# Patient Record
Sex: Male | Born: 1970
Health system: Southern US, Community
[De-identification: ages and names within clinical notes are randomized; demographics above are authoritative.]

## PROBLEM LIST (undated history)

## (undated) DIAGNOSIS — R51 Headache: Secondary | ICD-10-CM

## (undated) DIAGNOSIS — H9191 Unspecified hearing loss, right ear: Secondary | ICD-10-CM

## (undated) DIAGNOSIS — Z0000 Encounter for general adult medical examination without abnormal findings: Principal | ICD-10-CM

## (undated) DIAGNOSIS — E7800 Pure hypercholesterolemia, unspecified: Secondary | ICD-10-CM

## (undated) DIAGNOSIS — I10 Essential (primary) hypertension: Secondary | ICD-10-CM

## (undated) DIAGNOSIS — M109 Gout, unspecified: Secondary | ICD-10-CM

## (undated) DIAGNOSIS — E669 Obesity, unspecified: Principal | ICD-10-CM

## (undated) HISTORY — DX: Unspecified hearing loss, right ear: H91.91

## (undated) HISTORY — DX: Headache: R51

## (undated) HISTORY — DX: Encounter for general adult medical examination without abnormal findings: Z00.00

## (undated) HISTORY — DX: Obesity, unspecified: E66.9

## (undated) HISTORY — DX: Gout, unspecified: M10.9

---

## 2002-01-31 ENCOUNTER — Encounter: Payer: Self-pay | Admitting: Emergency Medicine

## 2002-01-31 ENCOUNTER — Emergency Department (HOSPITAL_COMMUNITY): Admission: EM | Admit: 2002-01-31 | Discharge: 2002-01-31 | Payer: Self-pay | Admitting: Emergency Medicine

## 2007-08-28 ENCOUNTER — Ambulatory Visit: Payer: Self-pay | Admitting: Cardiology

## 2007-08-28 LAB — CONVERTED CEMR LAB: Pro B Natriuretic peptide (BNP): 3 pg/mL (ref 0.0–100.0)

## 2007-09-19 ENCOUNTER — Ambulatory Visit: Payer: Self-pay

## 2007-09-19 ENCOUNTER — Encounter: Payer: Self-pay | Admitting: Cardiology

## 2009-08-19 ENCOUNTER — Encounter: Admission: RE | Admit: 2009-08-19 | Discharge: 2009-08-19 | Payer: Self-pay | Admitting: Otolaryngology

## 2010-11-28 NOTE — Assessment & Plan Note (Signed)
[facility]                            CARDIOLOGY OFFICE NOTE   TAVIOUS, GRIESINGER                        MRN:          [id number]  DATE:08/28/2007                            DOB:          1970-10-01    Mr. Bogard is a pleasant 40 year old gentleman whom I am asked to  evaluate for dyspnea and borderline exercise treadmill.  He has no prior  cardiac history.  For the past 6 months he has noticed some dyspnea on  exertion.  This typically occurs with longer walks, and more extreme  activities.  He does not occur at rest.  There is no orthopnea or PND.  There is no pedal edema.  He has not had chest pain by his report.  There are no palpitations or syncope.  He has multiple cardiac risk  factors including a strong family history.  He therefore underwent an  exercise treadmill at [facility].  The patient  was noted to have had chest pain/pressure, dizziness, and dyspnea.  There was a question of borderline ST depression that was felt to be  equivocal.  We were, therefore, asked to further evaluate.  His  medications include lisinopril HCT 25, 12.5 daily, Vytorin 10/40 daily,  multivitamin daily.  He has no known drug allergies.   SOCIAL HISTORY:  Does not smoke nor does he consume alcohol.   FAMILY HISTORY:  Strongly positive coronary disease.  His brother had  coronary bypass graft in his 56s.  His father died of myocardial  infarction in his 42s.  His mother died of an aneurysm.   PAST MEDICAL HISTORY:  Significant for hypertension, hyperlipidemia.  There is no diabetes mellitus.  He has had no previous surgeries.  He  does have osteoarthritis his knees from his job.   REVIEW OF SYSTEMS:  He denies any headaches or fevers, chills.  There is  no productive cough or hemoptysis.  There is no dysphagia, odynophagia,  melena or hematochezia.  There is no dysuria inch of his or seizure  active.  There is no orthopnea, PND or pedal  edema.  Remaining systems  are negative.   PHYSICAL EXAM:  Today shows a blood pressure 121/76, pulse is 65.  Weight 227 pounds.  Well-developed, somewhat obese.  He is no acute distress at present.  Skin is warm and dry.  Not depressed.  There is no peripheral clubbing.  His back is normal.  HEENT:  Normal with normal eyelids.  Neck is supple with normal upstroke bilaterally.  No bruits noted.  There is no jugular distention and no thyromegaly is noted.  His chest is clear to auscultation with no expansion.  CARDIOVASCULAR:  Regular rhythm.  Normal S1-S2.  There are no murmurs,  rubs or gallops noted.  ABDOMEN:  Exam nontender.  Positive bowel sounds hepatosplenomegaly no  mass appreciated.  There is no abdominal bruit.  He has 2+ femoral  pulses bilaterally.  No bruits.  EXTREMITIES:  Show no edema palpate no cords.  He is 2+ posterior tibial  pulses bilaterally.  NEUROLOGIC:  Grossly intact.  His electrocardiogram shows a sinus rhythm at a rate of 77.  The axis  was normal.  There is significant ST changes.  This is from a treadmill  motion and rocking and family practice on July 30, 2007.   DIAGNOSES:  1. Dyspnea on exertion - Mr. Hutsell is having dyspnea.  This may be      related to cardiac status but could also be related to      deconditioning as he does not exercise and somewhat overweight.  I      will check a BNP.  We will also schedule him at stress      echocardiogram both to quantify his LV function and to more fully      assess for ischemia.  If of there are no significant abnormalities      and we will plan risk factor modification and I have encouraged      strict diet and exercise particularly in light of the strong family      history.  2. Hypertension - his blood pressure is controlled on present      medications.  He will call continue the lisinopril      hydrochlorothiazide.  This is being followed by [facility]  3.  Hyperlipidemia - he will continue on statin.  This is also being      monitored by [facility].  4. Risk factor modification - we again discussed importance of diet      and exercise.  Note he does not smoke.  We will see him back on as-      needed basis pending the results of the stress echocardiogram.     Madolyn Frieze. Jens Som, MD, [facility]  Electronically Signed    BSC/MedQ  DD: 08/28/2007  DT: 08/29/2007  Job #: [id number]   cc:   Ernestina Penna, M.D.

## 2012-12-16 ENCOUNTER — Ambulatory Visit (INDEPENDENT_AMBULATORY_CARE_PROVIDER_SITE_OTHER): Payer: BC Managed Care – PPO | Admitting: Family Medicine

## 2012-12-16 ENCOUNTER — Encounter: Payer: Self-pay | Admitting: Family Medicine

## 2012-12-16 VITALS — BP 116/73 | HR 66 | Temp 97.0°F | Wt 218.8 lb

## 2012-12-16 DIAGNOSIS — I10 Essential (primary) hypertension: Secondary | ICD-10-CM

## 2012-12-16 DIAGNOSIS — R202 Paresthesia of skin: Secondary | ICD-10-CM

## 2012-12-16 DIAGNOSIS — E785 Hyperlipidemia, unspecified: Secondary | ICD-10-CM

## 2012-12-16 DIAGNOSIS — R635 Abnormal weight gain: Secondary | ICD-10-CM

## 2012-12-16 DIAGNOSIS — R300 Dysuria: Secondary | ICD-10-CM

## 2012-12-16 DIAGNOSIS — E669 Obesity, unspecified: Secondary | ICD-10-CM

## 2012-12-16 DIAGNOSIS — R209 Unspecified disturbances of skin sensation: Secondary | ICD-10-CM

## 2012-12-16 LAB — POCT UA - MICROSCOPIC ONLY
Bacteria, U Microscopic: NEGATIVE
Casts, Ur, LPF, POC: NEGATIVE
Crystals, Ur, HPF, POC: NEGATIVE
Epithelial cells, urine per micros: NEGATIVE
Mucus, UA: NEGATIVE
RBC, urine, microscopic: NEGATIVE
WBC, Ur, HPF, POC: NEGATIVE
Yeast, UA: NEGATIVE

## 2012-12-16 LAB — VITAMIN B12: Vitamin B-12: 273 pg/mL (ref 211–911)

## 2012-12-16 LAB — COMPLETE METABOLIC PANEL WITH GFR
ALT: 19 U/L (ref 0–53)
AST: 19 U/L (ref 0–37)
Albumin: 4.2 g/dL (ref 3.5–5.2)
Alkaline Phosphatase: 53 U/L (ref 39–117)
BUN: 10 mg/dL (ref 6–23)
CO2: 25 mEq/L (ref 19–32)
Calcium: 9.4 mg/dL (ref 8.4–10.5)
Chloride: 104 mEq/L (ref 96–112)
Creat: 0.95 mg/dL (ref 0.50–1.35)
GFR, Est African American: >89 mL/min
GFR, Est Non African American: >89 mL/min
Glucose, Bld: 82 mg/dL (ref 70–99)
Potassium: 3.9 mEq/L (ref 3.5–5.3)
Sodium: 137 mEq/L (ref 135–145)
Total Bilirubin: 0.6 mg/dL (ref 0.3–1.2)
Total Protein: 7.1 g/dL (ref 6.0–8.3)

## 2012-12-16 LAB — POCT URINALYSIS DIPSTICK
Bilirubin, UA: NEGATIVE
Blood, UA: NEGATIVE
Glucose, UA: NEGATIVE
Ketones, UA: NEGATIVE
Leukocytes, UA: NEGATIVE
Nitrite, UA: NEGATIVE
Protein, UA: NEGATIVE
Spec Grav, UA: 1.01
Urobilinogen, UA: NEGATIVE
pH, UA: 7

## 2012-12-16 LAB — TSH: TSH: 1.286 u[IU]/mL (ref 0.350–4.500)

## 2012-12-16 LAB — POCT GLYCOSYLATED HEMOGLOBIN (HGB A1C): Hemoglobin A1C: 5.4

## 2012-12-16 MED ORDER — AMLODIPINE BESYLATE 10 MG PO TABS: 10.0000 mg | ORAL_TABLET | Freq: Every day | ORAL | Status: DC

## 2012-12-16 MED ORDER — LOSARTAN POTASSIUM-HCTZ 100-25 MG PO TABS: 1.0000 | ORAL_TABLET | Freq: Every day | ORAL | Status: DC

## 2012-12-16 MED ORDER — SIMVASTATIN 20 MG PO TABS: 20.0000 mg | ORAL_TABLET | Freq: Every day | ORAL | Status: DC

## 2012-12-16 NOTE — Patient Instructions (Addendum)
      Dr Woodroe Mode Recommendations  Diet and Exercise discussed with patient.  For nutrition information, I recommend books:  1).Eat to Live by Dr Monico Hoar. 2).Prevent and Reverse Heart Disease by Dr Suzzette Righter. 3) Dr Katherina Right Book: Reversing Diabetes  Exercise recommendations are:  If unable to walk, then the patient can exercise in a chair 3 times a day. By flapping arms like a bird gently and raising legs outwards to the front.  If ambulatory, the patient can go for walks for 30 minutes 3 times a week. Then increase the intensity and duration as tolerated.  Goal is to try to attain exercise frequency to 5 times a week.  If applicable: Best to perform resistance exercises (machines or weights) 2 days a week and cardio type exercises 3 days per week.   Take a baby aspirin 81 mg daily.

## 2012-12-16 NOTE — Progress Notes (Signed)
Patient ID: Jason Harper, male   DOB: Sep 01, 1970, 42 y.o.   MRN: [id number] SUBJECTIVE: HPI: Patient is here for follow up of hyperlipidemia/hypertension:  denies Headache;denies Chest Pain;denies weakness;denies Shortness of Breath and orthopnea;denies Visual changes;denies palpitations;denies cough;denies pedal edema;denies symptoms of TIA or stroke;deniesClaudication symptoms. admits to Compliance with medications; denies Problems with medications.  Gets a tingling in the scrotal area intermittently, especially when he is urinating. No urethral d/c, denies STDs.  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  Allergies: reviewed/updated in Epic  Medications: reviewed/updated in Epic  Immunizations: reviewed/updated in Epic  ROS: As above in the HPI. All other systems are stable or negative.  OBJECTIVE: APPEARANCE:  Patient in no acute distress.The patient appeared well nourished and normally developed. Acyanotic. Waist:43.5 inches VITAL SIGNS:BP 116/73  Pulse 66  Temp(Src) 97 F (36.1 C) (Oral)  Wt 218 lb 12.8 oz (99.247 kg) Obese WM  SKIN: warm and  Dry without overt rashes, tattoos and scars  HEAD and Neck: without JVD, Head and scalp: normal Eyes:No scleral icterus. Fundi normal, eye movements normal. Ears: Auricle normal, canal normal, Tympanic membranes normal, insufflation normal. Nose: normal Throat: normal Neck & thyroid: normal  CHEST & LUNGS: Chest wall: normal Lungs: Clear  CVS: Reveals the PMI to be normally located. Regular rhythm, First and Second Heart sounds are normal,  absence of murmurs, rubs or gallops. Peripheral vasculature: Radial pulses: normal Dorsal pedis pulses: normal Posterior pulses: normal  ABDOMEN:  Appearance:obese Benign,, no organomegaly, no masses, no  Abdominal Aortic enlargement. No Guarding , no rebound. No Bruits. Bowel sounds: normal  RECTAL: N/A [id number]  EXTREMETIES: nonedematous. Both Femoral and Pedal pulses are normal.  MUSCULOSKELETAL:  Spine: normal Joints: intact  NEUROLOGIC: oriented to time,place and person; nonfocal. Strength is normal Sensory is normal Reflexes are normal Cranial Nerves are normal.  ASSESSMENT: HTN (hypertension) - Plan: COMPLETE METABOLIC PANEL WITH GFR, amLODipine (NORVASC) 10 MG tablet, losartan-hydrochlorothiazide (HYZAAR) 100-25 MG per tablet  HLD (hyperlipidemia) - Plan: NMR Lipoprofile with Lipids, simvastatin (ZOCOR) 20 MG tablet  Obesity, unspecified - Plan: POCT glycosylated hemoglobin (Hb A1C)  Dysuria - Plan: POCT UA - Microscopic Only, POCT urinalysis dipstick, Urine culture  Paresthesia - Plan: Vitamin B12, TSH  Metabolic syndrome.  PLAN: Orders Placed This Encounter  Procedures  . Urine culture  . COMPLETE METABOLIC PANEL WITH GFR  . NMR Lipoprofile with Lipids  . Vitamin B12  . TSH  . POCT UA - Microscopic Only  . POCT glycosylated hemoglobin (Hb A1C)  . POCT urinalysis dipstick   Meds ordered this encounter  Medications  . DISCONTD: amLODipine (NORVASC) 10 MG tablet    Sig: 10 mg daily.   Marland Kitchen DISCONTD: losartan-hydrochlorothiazide (HYZAAR) 100-25 MG per tablet    Sig: Take 1 tablet by mouth daily.   Marland Kitchen DISCONTD: simvastatin (ZOCOR) 20 MG tablet    Sig: Take 20 mg by mouth at bedtime.   Marland Kitchen amLODipine (NORVASC) 10 MG tablet    Sig: Take 1 tablet (10 mg total) by mouth daily.    Dispense:  30 tablet    Refill:  5  . losartan-hydrochlorothiazide (HYZAAR) 100-25 MG per tablet    Sig: Take 1 tablet by mouth daily.    Dispense:  30 tablet    Refill:  5  . simvastatin (ZOCOR) 20 MG tablet    Sig:  Take 1 tablet (20 mg total) by mouth at bedtime.    Dispense:  30 tablet    Refill:  5        Dr Woodroe Mode Recommendations  Diet and Exercise discussed with  patient.  For nutrition information, I recommend books:  1).Eat to Live by Dr Monico Hoar. 2).Prevent and Reverse Heart Disease by Dr Suzzette Righter. 3) Dr Katherina Right Book: Reversing Diabetes  Exercise recommendations are:  If unable to walk, then the patient can exercise in a chair 3 times a day. By flapping arms like a bird gently and raising legs outwards to the front.  If ambulatory, the patient can go for walks for 30 minutes 3 times a week. Then increase the intensity and duration as tolerated.  Goal is to try to attain exercise frequency to 5 times a week.  If applicable: Best to perform resistance exercises (machines or weights) 2 days a week and cardio type exercises 3 days per week.  30 minutes discussion on Lifestyle therapeutic changes needed.  Return in about 3 months (around 03/18/2013) for Recheck medical problems.   Vladimir Lenhoff P. [place] Charon, M.D.

## 2012-12-17 LAB — NMR LIPOPROFILE WITH LIPIDS
Cholesterol, Total: 135 mg/dL (ref <200)
HDL Particle Number: 25.9 umol/L — ABNORMAL LOW (ref >=30.5)
HDL Size: 8.6 nm — ABNORMAL LOW (ref >=9.2)
HDL-C: 31 mg/dL — ABNORMAL LOW (ref >=40)
LDL (calc): 84 mg/dL (ref <100)
LDL Particle Number: 1482 nmol/L — ABNORMAL HIGH (ref <1000)
LDL Size: 19.7 nm — ABNORMAL LOW (ref >20.5)
LP-IR Score: 70 — ABNORMAL HIGH (ref <=45)
Large HDL-P: <1.3 umol/L — ABNORMAL LOW (ref >=4.8)
Large VLDL-P: 2.2 nmol/L (ref <=2.7)
Small LDL Particle Number: 1195 nmol/L — ABNORMAL HIGH (ref <=527)
Triglycerides: 102 mg/dL (ref <150)
VLDL Size: 46.8 nm — ABNORMAL HIGH (ref <=46.6)

## 2012-12-17 LAB — URINE CULTURE
Colony Count: NO GROWTH
Organism ID, Bacteria: NO GROWTH

## 2012-12-18 NOTE — Progress Notes (Signed)
Quick Note:  Lab result at goal or close to goal. No change in Medications for now.Recommend aggressive diet , exercise and weight loss otherwise we will have to add more cholesterol medications. No Change in plans and follow up. Cause for the tingling not found. Observe for now. Neetu Carrozza P. [place] Charon, M.D.  ______

## 2012-12-19 ENCOUNTER — Telehealth: Payer: Self-pay | Admitting: Family Medicine

## 2012-12-19 NOTE — Telephone Encounter (Signed)
Left results on pt vm and requested to call if questions

## 2013-01-07 ENCOUNTER — Emergency Department (HOSPITAL_COMMUNITY): Payer: BC Managed Care – PPO

## 2013-01-07 ENCOUNTER — Encounter (HOSPITAL_COMMUNITY): Payer: Self-pay

## 2013-01-07 ENCOUNTER — Emergency Department (HOSPITAL_COMMUNITY)
Admission: EM | Admit: 2013-01-07 | Discharge: 2013-01-07 | Disposition: A | Discharge status: Home / Self Care | Payer: BC Managed Care – PPO | Attending: Emergency Medicine | Admitting: Emergency Medicine

## 2013-01-07 DIAGNOSIS — S0990XA Unspecified injury of head, initial encounter: Secondary | ICD-10-CM | POA: Insufficient documentation

## 2013-01-07 DIAGNOSIS — Z7982 Long term (current) use of aspirin: Secondary | ICD-10-CM | POA: Insufficient documentation

## 2013-01-07 DIAGNOSIS — S8990XA Unspecified injury of unspecified lower leg, initial encounter: Secondary | ICD-10-CM | POA: Insufficient documentation

## 2013-01-07 DIAGNOSIS — I10 Essential (primary) hypertension: Secondary | ICD-10-CM | POA: Insufficient documentation

## 2013-01-07 DIAGNOSIS — M79671 Pain in right foot: Secondary | ICD-10-CM

## 2013-01-07 DIAGNOSIS — S99919A Unspecified injury of unspecified ankle, initial encounter: Secondary | ICD-10-CM | POA: Insufficient documentation

## 2013-01-07 DIAGNOSIS — S4980XA Other specified injuries of shoulder and upper arm, unspecified arm, initial encounter: Secondary | ICD-10-CM | POA: Insufficient documentation

## 2013-01-07 DIAGNOSIS — Y92410 Unspecified street and highway as the place of occurrence of the external cause: Secondary | ICD-10-CM | POA: Insufficient documentation

## 2013-01-07 DIAGNOSIS — Z79899 Other long term (current) drug therapy: Secondary | ICD-10-CM | POA: Insufficient documentation

## 2013-01-07 DIAGNOSIS — S0993XA Unspecified injury of face, initial encounter: Secondary | ICD-10-CM | POA: Insufficient documentation

## 2013-01-07 DIAGNOSIS — S46909A Unspecified injury of unspecified muscle, fascia and tendon at shoulder and upper arm level, unspecified arm, initial encounter: Secondary | ICD-10-CM | POA: Insufficient documentation

## 2013-01-07 DIAGNOSIS — E7800 Pure hypercholesterolemia, unspecified: Secondary | ICD-10-CM | POA: Insufficient documentation

## 2013-01-07 DIAGNOSIS — M25511 Pain in right shoulder: Secondary | ICD-10-CM

## 2013-01-07 DIAGNOSIS — S99929A Unspecified injury of unspecified foot, initial encounter: Secondary | ICD-10-CM | POA: Insufficient documentation

## 2013-01-07 DIAGNOSIS — Y9389 Activity, other specified: Secondary | ICD-10-CM | POA: Insufficient documentation

## 2013-01-07 HISTORY — DX: Essential (primary) hypertension: I10

## 2013-01-07 HISTORY — DX: Pure hypercholesterolemia, unspecified: E78.00

## 2013-01-07 MED ORDER — OXYCODONE-ACETAMINOPHEN 5-325 MG PO TABS
2.0000 | ORAL_TABLET | Freq: Once | ORAL | Status: AC
  Administered 2013-01-07: 2 via ORAL
  Filled 2013-01-07: qty 2

## 2013-01-07 NOTE — ED Notes (Signed)
Family at bedside. 
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
## 2013-01-08 ENCOUNTER — Telehealth: Payer: Self-pay | Admitting: Nurse Practitioner
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
## 2013-01-12 ENCOUNTER — Encounter: Payer: Self-pay | Admitting: Nurse Practitioner

## 2013-01-12 ENCOUNTER — Ambulatory Visit: Payer: BC Managed Care – PPO | Admitting: Nurse Practitioner

## 2013-01-12 VITALS — BP 117/78 | HR 77 | Temp 98.1°F | Ht 65.0 in | Wt 216.0 lb

## 2013-01-12 DIAGNOSIS — M545 Low back pain, unspecified: Secondary | ICD-10-CM

## 2013-01-12 DIAGNOSIS — M25519 Pain in unspecified shoulder: Secondary | ICD-10-CM

## 2013-01-12 DIAGNOSIS — G8911 Acute pain due to trauma: Secondary | ICD-10-CM

## 2013-01-12 MED ORDER — IBUPROFEN 800 MG PO TABS: 800.0000 mg | ORAL_TABLET | Freq: Three times a day (TID) | ORAL | Status: DC | PRN

## 2013-01-12 MED ORDER — CYCLOBENZAPRINE HCL 10 MG PO TABS: 10.0000 mg | ORAL_TABLET | Freq: Three times a day (TID) | ORAL | Status: DC | PRN
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
## 2013-01-12 NOTE — Patient Instructions (Signed)
Back Pain, Adult  Low back pain is very common. About 1 in 5 people have back pain. The cause of low back pain is rarely dangerous. The pain often gets better over time. About half of people with a sudden onset of back pain feel better in just 2 weeks. About 8 in 10 people feel better by 6 weeks.   CAUSES  Some common causes of back pain include:  · Strain of the muscles or ligaments supporting the spine.  · Wear and tear (degeneration) of the spinal discs.  · Arthritis.  · Direct injury to the back.  DIAGNOSIS  Most of the time, the direct cause of low back pain is not known. However, back pain can be treated effectively even when the exact cause of the pain is unknown. Answering your caregiver's questions about your overall health and symptoms is one of the most accurate ways to make sure the cause of your pain is not dangerous. If your caregiver needs more information, he or she may order lab work or imaging tests (X-rays or MRIs). However, even if imaging tests show changes in your back, this usually does not require surgery.  HOME CARE INSTRUCTIONS  For many people, back pain returns. Since low back pain is rarely dangerous, it is often a condition that people can learn to manage on their own.   · Remain active. It is stressful on the back to sit or stand in one place. Do not sit, drive, or stand in one place for more than 30 minutes at a time. Take short walks on level surfaces as soon as pain allows. Try to increase the length of time you walk each day.  · Do not stay in bed. Resting more than 1 or 2 days can delay your recovery.  · Do not avoid exercise or work. Your body is made to move. It is not dangerous to be active, even though your back may hurt. Your back will likely heal faster if you return to being active before your pain is gone.  · Pay attention to your body when you  bend and lift. Many people have less discomfort when lifting if they bend their knees, keep the load close to their bodies, and  avoid twisting. Often, the most comfortable positions are those that put less stress on your recovering back.  · Find a comfortable position to sleep. Use a firm mattress and lie on your side with your knees slightly bent. If you lie on your back, put a pillow under your knees.  · Only take over-the-counter or prescription medicines as directed by your caregiver. Over-the-counter medicines to reduce pain and inflammation are often the most helpful. Your caregiver may prescribe muscle relaxant drugs. These medicines help dull your pain so you can more quickly return to your normal activities and healthy exercise.  · Put ice on the injured area.  · Put ice in a plastic bag.  · Place a towel between your skin and the bag.  · Leave the ice on for 15-20 minutes, 3-4 times a day for the first 2 to 3 days. After that, ice and heat may be alternated to reduce pain and spasms.  · Ask your caregiver about trying back exercises and gentle massage. This may be of some benefit.  · Avoid feeling anxious or stressed. Stress increases muscle tension and can worsen back pain. It is important to recognize when you are anxious or stressed and learn ways to manage it. Exercise is a great option.  SEEK MEDICAL CARE IF:  · You have pain that is not relieved with rest or   medicine.  · You have pain that does not improve in 1 week.  · You have new symptoms.  · You are generally not feeling well.  SEEK IMMEDIATE MEDICAL CARE IF:   · You have pain that radiates from your back into your legs.  · You develop new bowel or bladder control problems.  · You have unusual weakness or numbness in your arms or legs.  · You develop nausea or vomiting.  · You develop abdominal pain.  · You feel faint.  Document Released: 07/02/2005 Document Revised: 01/01/2012 Document Reviewed: 11/20/2010  Exit[employer]® Patient Information ©2014 [employer].

## 2013-03-19 ENCOUNTER — Ambulatory Visit: Payer: BC Managed Care – PPO | Admitting: Family Medicine

## 2013-04-01 ENCOUNTER — Ambulatory Visit (INDEPENDENT_AMBULATORY_CARE_PROVIDER_SITE_OTHER): Payer: BC Managed Care – PPO | Admitting: Nurse Practitioner

## 2013-04-01 ENCOUNTER — Encounter: Payer: Self-pay | Admitting: Nurse Practitioner

## 2013-04-01 VITALS — BP 120/77 | HR 74 | Temp 97.1°F | Ht 65.0 in | Wt 222.0 lb

## 2013-04-01 DIAGNOSIS — E785 Hyperlipidemia, unspecified: Secondary | ICD-10-CM

## 2013-04-01 DIAGNOSIS — Z125 Encounter for screening for malignant neoplasm of prostate: Secondary | ICD-10-CM

## 2013-04-01 DIAGNOSIS — I10 Essential (primary) hypertension: Secondary | ICD-10-CM | POA: Insufficient documentation

## 2013-04-01 MED ORDER — AMLODIPINE BESYLATE 10 MG PO TABS: 10.0000 mg | ORAL_TABLET | Freq: Every day | ORAL | Status: DC

## 2013-04-01 MED ORDER — LOSARTAN POTASSIUM-HCTZ 100-25 MG PO TABS: 1.0000 | ORAL_TABLET | Freq: Every day | ORAL | Status: DC

## 2013-04-01 MED ORDER — SIMVASTATIN 20 MG PO TABS: 20.0000 mg | ORAL_TABLET | Freq: Every day | ORAL | Status: DC

## 2013-04-01 NOTE — Patient Instructions (Signed)
Health Maintenance, Males A healthy lifestyle and preventative care can promote health and wellness.  Maintain regular health, dental, and eye exams.  Eat a healthy diet. Foods like vegetables, fruits, whole grains, low-fat dairy products, and lean protein foods contain the nutrients you need without too many calories. Decrease your intake of foods high in solid fats, added sugars, and salt. Get information about a proper diet from your caregiver, if necessary.  Regular physical exercise is one of the most important things you can do for your health. Most adults should get at least 150 minutes of moderate-intensity exercise (any activity that increases your heart rate and causes you to sweat) each week. In addition, most adults need muscle-strengthening exercises on 2 or more days a week.   Maintain a healthy weight. The body mass index (BMI) is a screening tool to identify possible weight problems. It provides an estimate of body fat based on height and weight. Your caregiver can help determine your BMI, and can help you achieve or maintain a healthy weight. For adults 20 years and older:  A BMI below 18.5 is considered underweight.  A BMI of 18.5 to 24.9 is normal.  A BMI of 25 to 29.9 is considered overweight.  A BMI of 30 and above is considered obese.  Maintain normal blood lipids and cholesterol by exercising and minimizing your intake of saturated fat. Eat a balanced diet with plenty of fruits and vegetables. Blood tests for lipids and cholesterol should begin at age 20 and be repeated every 5 years. If your lipid or cholesterol levels are high, you are over 50, or you are a high risk for heart disease, you may need your cholesterol levels checked more frequently.Ongoing high lipid and cholesterol levels should be treated with medicines, if diet and exercise are not effective.  If you smoke, find out from your caregiver how to quit. If you do not use tobacco, do not start.  If you  choose to drink alcohol, do not exceed 2 drinks per day. One drink is considered to be 12 ounces (355 mL) of beer, 5 ounces (148 mL) of wine, or 1.5 ounces (44 mL) of liquor.  Avoid use of street drugs. Do not share needles with anyone. Ask for help if you need support or instructions about stopping the use of drugs.  High blood pressure causes heart disease and increases the risk of stroke. Blood pressure should be checked at least every 1 to 2 years. Ongoing high blood pressure should be treated with medicines if weight loss and exercise are not effective.  If you are 45 to 40 years old, ask your caregiver if you should take aspirin to prevent heart disease.  Diabetes screening involves taking a blood sample to check your fasting blood sugar level. This should be done once every 3 years, after age 45, if you are within normal weight and without risk factors for diabetes. Testing should be considered at a younger age or be carried out more frequently if you are overweight and have at least 1 risk factor for diabetes.  Colorectal cancer can be detected and often prevented. Most routine colorectal cancer screening begins at the age of 50 and continues through age 75. However, your caregiver may recommend screening at an earlier age if you have risk factors for colon cancer. On a yearly basis, your caregiver may provide home test kits to check for hidden blood in the stool. Use of a small camera at the end of a tube,   to directly examine the colon (sigmoidoscopy or colonoscopy), can detect the earliest forms of colorectal cancer. Talk to your caregiver about this at age 50, when routine screening begins. Direct examination of the colon should be repeated every 5 to 10 years through age 75, unless early forms of pre-cancerous polyps or small growths are found.  Hepatitis C blood testing is recommended for all people born from 1945 through 1965 and any individual with known risks for hepatitis C.  Healthy  men should no longer receive prostate-specific antigen (PSA) blood tests as part of routine cancer screening. Consult with your caregiver about prostate cancer screening.  Testicular cancer screening is not recommended for adolescents or adult males who have no symptoms. Screening includes self-exam, caregiver exam, and other screening tests. Consult with your caregiver about any symptoms you have or any concerns you have about testicular cancer.  Practice safe sex. Use condoms and avoid high-risk sexual practices to reduce the spread of sexually transmitted infections (STIs).  Use sunscreen with a sun protection factor (SPF) of 30 or greater. Apply sunscreen liberally and repeatedly throughout the day. You should seek shade when your shadow is shorter than you. Protect yourself by wearing long sleeves, pants, a wide-brimmed hat, and sunglasses year round, whenever you are outdoors.  Notify your caregiver of new moles or changes in moles, especially if there is a change in shape or color. Also notify your caregiver if a mole is larger than the size of a pencil eraser.  A one-time screening for abdominal aortic aneurysm (AAA) and surgical repair of large AAAs by sound wave imaging (ultrasonography) is recommended for ages 65 to 75 years who are current or former smokers.  Stay current with your immunizations. Document Released: 12/29/2007 Document Revised: 09/24/2011 Document Reviewed: 11/27/2010 [employer] Patient Information 2014 [employer].  

## 2013-04-01 NOTE — Progress Notes (Signed)
  Subjective:    Patient ID: Ziaire Hagos, male    DOB: [birth year], 42 y.o.   MRN: [id number]  Hypertension This is a chronic problem. The current episode started more than 1 year ago. The problem is unchanged. The problem is controlled. Pertinent negatives include no anxiety, blurred vision, chest pain, headaches, neck pain, palpitations, peripheral edema or shortness of breath. There are no associated agents to hypertension. Risk factors for coronary artery disease include dyslipidemia, family history and male gender. Past treatments include diuretics, angiotensin blockers and calcium channel blockers. The current treatment provides significant improvement. Compliance problems include diet and exercise.  There is no history of chronic renal disease.  Hyperlipidemia This is a chronic problem. The current episode started more than 1 year ago. The problem is uncontrolled. Recent lipid tests were reviewed and are high. He has no history of chronic renal disease, diabetes or obesity. Factors aggravating his hyperlipidemia include thiazides. Pertinent negatives include no chest pain or shortness of breath. Current antihyperlipidemic treatment includes statins. The current treatment provides moderate improvement of lipids. Compliance problems include adherence to diet and adherence to exercise.  Risk factors for coronary artery disease include hypertension and male sex.  GERD New problem- started in February just intermittently- No difficulty swallowing- Burning sensation in chest - wakes him up at night on occasion. Taking TUMS that helps.   Review of Systems  HENT: Negative for neck pain.   Eyes: Negative for blurred vision.  Respiratory: Negative for shortness of breath.   Cardiovascular: Negative for chest pain and palpitations.  Neurological: Negative for headaches.  All other systems reviewed and are negative.       Objective:   Physical Exam  Constitutional: He is oriented to person, place,  and time. He appears well-developed and well-nourished.  HENT:  Head: Normocephalic.  Right Ear: External ear normal.  Left Ear: External ear normal.  Nose: Nose normal.  Mouth/Throat: Oropharynx is clear and moist.  Eyes: EOM are normal. Pupils are equal, round, and reactive to light.  Neck: Normal range of motion. Neck supple. No thyromegaly present.  Cardiovascular: Normal rate, regular rhythm, normal heart sounds and intact distal pulses.   No murmur heard. Pulmonary/Chest: Effort normal and breath sounds normal. He has no wheezes. He has no rales.  Abdominal: Soft. Bowel sounds are normal.  Musculoskeletal: Normal range of motion.  Neurological: He is alert and oriented to person, place, and time.  Skin: Skin is warm and dry.  Psychiatric: He has a normal mood and affect. His behavior is normal. Judgment and thought content normal.    BP 120/77  Pulse 74  Temp(Src) 97.1 F (36.2 C) (Oral)  Ht 5\' 5"  (1.651 m)  Wt 222 lb (100.699 kg)  BMI 36.94 kg/m2       Assessment & Plan:  1. Hypertension Low NA+ diet - amLODipine (NORVASC) 10 MG tablet; Take 1 tablet (10 mg total) by mouth daily.  Dispense: 30 tablet; Refill: 5 - losartan-hydrochlorothiazide (HYZAAR) 100-25 MG per tablet; Take 1 tablet by mouth daily.  Dispense: 30 tablet; Refill: 5 - CMP14+EGFR  2. Hyperlipidemia LDL goal < 100 Low fat diet and exercise - simvastatin (ZOCOR) 20 MG tablet; Take 1 tablet (20 mg total) by mouth at bedtime.  Dispense: 30 tablet; Refill: 5 - NMR, lipoprofile  3. Prostate cancer screening - PSA, total and free  Health maintenance reviewed Follow up in 3 months  Mary-Margaret Daphine Deutscher, FNP

## 2013-04-02 ENCOUNTER — Telehealth: Payer: Self-pay | Admitting: Nurse Practitioner

## 2013-04-02 NOTE — Telephone Encounter (Signed)
Check with pharmacy they received order yesterday ([facility])

## 2013-04-02 NOTE — Telephone Encounter (Signed)
Called [facility] they do have his rx there and patient aware

## 2013-04-03 LAB — CMP14+EGFR
Albumin/Globulin Ratio: 2 (ref 1.1–2.5)
Albumin: 4.6 g/dL (ref 3.5–5.5)
BUN: 11 mg/dL (ref 6–24)
GFR calc Af Amer: 125 mL/min/{1.73_m2} (ref >59)
GFR calc non Af Amer: 108 mL/min/{1.73_m2} (ref >59)
Glucose: 83 mg/dL (ref 65–99)
Total Bilirubin: 0.5 mg/dL (ref 0.0–1.2)
Total Protein: 6.9 g/dL (ref 6.0–8.5)

## 2013-04-03 LAB — NMR, LIPOPROFILE
Cholesterol: 169 mg/dL (ref <200)
LDL Size: 20 nm — ABNORMAL LOW (ref >20.5)
Small LDL Particle Number: 1086 nmol/L — ABNORMAL HIGH (ref <=527)
Triglycerides by NMR: 79 mg/dL (ref <150)

## 2013-04-03 MED ORDER — OMEPRAZOLE 40 MG PO CPDR: 40.0000 mg | DELAYED_RELEASE_CAPSULE | Freq: Every day | ORAL | Status: DC

## 2013-04-03 NOTE — Telephone Encounter (Signed)
Omeprazole rx sent to pharmacy.

## 2013-04-03 NOTE — Telephone Encounter (Signed)
Pt says he saw MMM on 04/01/13 and she was going to send order for reflux med, still hasnt got?

## 2013-04-06 ENCOUNTER — Other Ambulatory Visit: Payer: Self-pay | Admitting: Nurse Practitioner

## 2013-04-06 MED ORDER — ROSUVASTATIN CALCIUM 10 MG PO TABS: 10.0000 mg | ORAL_TABLET | Freq: Every day | ORAL | Status: DC

## 2013-07-03 ENCOUNTER — Encounter: Payer: Self-pay | Admitting: Nurse Practitioner

## 2013-07-03 ENCOUNTER — Ambulatory Visit (INDEPENDENT_AMBULATORY_CARE_PROVIDER_SITE_OTHER): Payer: BC Managed Care – PPO | Admitting: Nurse Practitioner

## 2013-07-03 VITALS — BP 111/69 | HR 79 | Temp 98.0°F | Ht 65.0 in | Wt 227.0 lb

## 2013-07-03 DIAGNOSIS — E785 Hyperlipidemia, unspecified: Secondary | ICD-10-CM

## 2013-07-03 DIAGNOSIS — I10 Essential (primary) hypertension: Secondary | ICD-10-CM

## 2013-07-03 NOTE — Patient Instructions (Signed)
Health Maintenance, Males A healthy lifestyle and preventative care can promote health and wellness.  Maintain regular health, dental, and eye exams.  Eat a healthy diet. Foods like vegetables, fruits, whole grains, low-fat dairy products, and lean protein foods contain the nutrients you need without too many calories. Decrease your intake of foods high in solid fats, added sugars, and salt. Get information about a proper diet from your caregiver, if necessary.  Regular physical exercise is one of the most important things you can do for your health. Most adults should get at least 150 minutes of moderate-intensity exercise (any activity that increases your heart rate and causes you to sweat) each week. In addition, most adults need muscle-strengthening exercises on 2 or more days a week.   Maintain a healthy weight. The body mass index (BMI) is a screening tool to identify possible weight problems. It provides an estimate of body fat based on height and weight. Your caregiver can help determine your BMI, and can help you achieve or maintain a healthy weight. For adults 20 years and older:  A BMI below 18.5 is considered underweight.  A BMI of 18.5 to 24.9 is normal.  A BMI of 25 to 29.9 is considered overweight.  A BMI of 30 and above is considered obese.  Maintain normal blood lipids and cholesterol by exercising and minimizing your intake of saturated fat. Eat a balanced diet with plenty of fruits and vegetables. Blood tests for lipids and cholesterol should begin at age 20 and be repeated every 5 years. If your lipid or cholesterol levels are high, you are over 50, or you are a high risk for heart disease, you may need your cholesterol levels checked more frequently.Ongoing high lipid and cholesterol levels should be treated with medicines, if diet and exercise are not effective.  If you smoke, find out from your caregiver how to quit. If you do not use tobacco, do not start.  Lung  cancer screening is recommended for adults aged 55 80 years who are at high risk for developing lung cancer because of a history of smoking. Yearly low-dose computed tomography (CT) is recommended for people who have at least a 30-pack-year history of smoking and are a current smoker or have quit within the past 15 years. A pack year of smoking is smoking an average of 1 pack of cigarettes a day for 1 year (for example: 1 pack a day for 30 years or 2 packs a day for 15 years). Yearly screening should continue until the smoker has stopped smoking for at least 15 years. Yearly screening should also be stopped for people who develop a health problem that would prevent them from having lung cancer treatment.  If you choose to drink alcohol, do not exceed 2 drinks per day. One drink is considered to be 12 ounces (355 mL) of beer, 5 ounces (148 mL) of wine, or 1.5 ounces (44 mL) of liquor.  Avoid use of street drugs. Do not share needles with anyone. Ask for help if you need support or instructions about stopping the use of drugs.  High blood pressure causes heart disease and increases the risk of stroke. Blood pressure should be checked at least every 1 to 2 years. Ongoing high blood pressure should be treated with medicines if weight loss and exercise are not effective.  If you are 45 to 40 years old, ask your caregiver if you should take aspirin to prevent heart disease.  Diabetes screening involves taking a blood   sample to check your fasting blood sugar level. This should be done once every 3 years, after age 45, if you are within normal weight and without risk factors for diabetes. Testing should be considered at a younger age or be carried out more frequently if you are overweight and have at least 1 risk factor for diabetes.  Colorectal cancer can be detected and often prevented. Most routine colorectal cancer screening begins at the age of 50 and continues through age 75. However, your caregiver may  recommend screening at an earlier age if you have risk factors for colon cancer. On a yearly basis, your caregiver may provide home test kits to check for hidden blood in the stool. Use of a small camera at the end of a tube, to directly examine the colon (sigmoidoscopy or colonoscopy), can detect the earliest forms of colorectal cancer. Talk to your caregiver about this at age 50, when routine screening begins. Direct examination of the colon should be repeated every 5 to 10 years through age 75, unless early forms of pre-cancerous polyps or small growths are found.  Hepatitis C blood testing is recommended for all people born from 1945 through 1965 and any individual with known risks for hepatitis C.  Healthy men should no longer receive prostate-specific antigen (PSA) blood tests as part of routine cancer screening. Consult with your caregiver about prostate cancer screening.  Testicular cancer screening is not recommended for adolescents or adult males who have no symptoms. Screening includes self-exam, caregiver exam, and other screening tests. Consult with your caregiver about any symptoms you have or any concerns you have about testicular cancer.  Practice safe sex. Use condoms and avoid high-risk sexual practices to reduce the spread of sexually transmitted infections (STIs).  Use sunscreen. Apply sunscreen liberally and repeatedly throughout the day. You should seek shade when your shadow is shorter than you. Protect yourself by wearing long sleeves, pants, a wide-brimmed hat, and sunglasses year round, whenever you are outdoors.  Notify your caregiver of new moles or changes in moles, especially if there is a change in shape or color. Also notify your caregiver if a mole is larger than the size of a pencil eraser.  A one-time screening for abdominal aortic aneurysm (AAA) and surgical repair of large AAAs by sound wave imaging (ultrasonography) is recommended for ages 65 to 75 years who are  current or former smokers.  Stay current with your immunizations. Document Released: 12/29/2007 Document Revised: 10/27/2012 Document Reviewed: 11/27/2010 [employer] Patient Information 2014 [employer].  

## 2013-07-03 NOTE — Progress Notes (Signed)
  Subjective:    Patient ID: Jason Harper, male    DOB: October 11, 1970, 42 y.o.   MRN: [id number]  Patient here tdoay for follow up - no changes since last visit- no complaints today  Hypertension This is a chronic problem. The current episode started more than 1 year ago. The problem is unchanged. The problem is controlled. Pertinent negatives include no anxiety, blurred vision, chest pain, headaches, neck pain, palpitations, peripheral edema or shortness of breath. There are no associated agents to hypertension. Risk factors for coronary artery disease include dyslipidemia, family history and male gender. Past treatments include diuretics, angiotensin blockers and calcium channel blockers. The current treatment provides significant improvement. Compliance problems include diet and exercise.  There is no history of chronic renal disease.  Hyperlipidemia This is a chronic problem. The current episode started more than 1 year ago. The problem is uncontrolled. Recent lipid tests were reviewed and are high. He has no history of chronic renal disease, diabetes or obesity. Factors aggravating his hyperlipidemia include thiazides. Pertinent negatives include no chest pain or shortness of breath. Current antihyperlipidemic treatment includes statins. The current treatment provides moderate improvement of lipids. Compliance problems include adherence to diet and adherence to exercise.  Risk factors for coronary artery disease include hypertension and male sex.  GERD New problem- started in February just intermittently- No difficulty swallowing- Burning sensation in chest - wakes him up at night on occasion. Taking TUMS that helps.   Review of Systems  Eyes: Negative for blurred vision.  Respiratory: Negative for shortness of breath.   Cardiovascular: Negative for chest pain and palpitations.  Musculoskeletal: Negative for neck pain.  Neurological: Negative for headaches.  All other systems reviewed and are  negative.       Objective:   Physical Exam  Constitutional: He is oriented to person, place, and time. He appears well-developed and well-nourished.  HENT:  Head: Normocephalic.  Right Ear: External ear normal.  Left Ear: External ear normal.  Nose: Nose normal.  Mouth/Throat: Oropharynx is clear and moist.  Eyes: EOM are normal. Pupils are equal, round, and reactive to light.  Neck: Normal range of motion. Neck supple. No thyromegaly present.  Cardiovascular: Normal rate, regular rhythm, normal heart sounds and intact distal pulses.   No murmur heard. Pulmonary/Chest: Effort normal and breath sounds normal. He has no wheezes. He has no rales.  Abdominal: Soft. Bowel sounds are normal.  Musculoskeletal: Normal range of motion.  Neurological: He is alert and oriented to person, place, and time.  Skin: Skin is warm and dry.  Psychiatric: He has a normal mood and affect. His behavior is normal. Judgment and thought content normal.    BP 111/69  Pulse 79  Temp(Src) 98 F (36.7 C) (Oral)  Ht 5\' 5"  (1.651 m)  Wt 227 lb (102.967 kg)  BMI 37.77 kg/m2       Assessment & Plan:   1. Hypertension   2. Hyperlipidemia LDL goal < 100    Orders Placed This Encounter  Procedures  . CMP14+EGFR  . NMR, lipoprofile     Continue all meds Labs pending Diet and exercise encouraged Health maintenance reviewed Follow up in 3 months  Mary-Margaret Daphine Deutscher, FNP

## 2013-07-04 LAB — CMP14+EGFR
AST: 19 IU/L (ref 0–40)
Albumin/Globulin Ratio: 2 (ref 1.1–2.5)
BUN/Creatinine Ratio: 12 (ref 9–20)
Calcium: 9.5 mg/dL (ref 8.7–10.2)
Creatinine, Ser: 0.85 mg/dL (ref 0.76–1.27)
GFR calc non Af Amer: 109 mL/min/{1.73_m2} (ref >59)
Globulin, Total: 2.2 g/dL (ref 1.5–4.5)
Sodium: 142 mmol/L (ref 134–144)

## 2013-07-04 LAB — NMR, LIPOPROFILE
Cholesterol: 145 mg/dL (ref <200)
HDL Cholesterol by NMR: 32 mg/dL — ABNORMAL LOW (ref >=40)
HDL Particle Number: 27.3 umol/L — ABNORMAL LOW (ref >=30.5)
Small LDL Particle Number: 866 nmol/L — ABNORMAL HIGH (ref <=527)

## 2013-07-06 ENCOUNTER — Telehealth: Payer: Self-pay | Admitting: Family Medicine

## 2013-07-06 NOTE — Telephone Encounter (Signed)
Message copied by Azalee Course on Mon Jul 06, 2013 10:36 AM ------      Message from: Bennie Pierini      Created: Mon Jul 06, 2013  8:24 AM       All labs look ok - continue current meds and recheck in 3 months ------

## 2013-07-21 ENCOUNTER — Telehealth: Payer: Self-pay | Admitting: Nurse Practitioner

## 2013-07-21 NOTE — Telephone Encounter (Signed)
PATIENT AWARE NO SAMPLES

## 2013-07-23 ENCOUNTER — Telehealth: Payer: Self-pay | Admitting: Nurse Practitioner

## 2013-07-23 NOTE — Telephone Encounter (Signed)
no crestor patient aware

## 2013-07-30 ENCOUNTER — Telehealth: Payer: Self-pay | Admitting: Nurse Practitioner

## 2013-07-30 NOTE — Telephone Encounter (Signed)
Patient aware to pick up samples ? ?

## 2013-10-05 ENCOUNTER — Telehealth: Payer: Self-pay | Admitting: Nurse Practitioner

## 2013-10-05 ENCOUNTER — Encounter: Payer: Self-pay | Admitting: Nurse Practitioner

## 2013-10-05 ENCOUNTER — Ambulatory Visit (INDEPENDENT_AMBULATORY_CARE_PROVIDER_SITE_OTHER): Payer: BC Managed Care – PPO | Admitting: Nurse Practitioner

## 2013-10-05 VITALS — BP 121/83 | HR 71 | Temp 97.1°F | Ht 65.0 in | Wt 229.0 lb

## 2013-10-05 DIAGNOSIS — I10 Essential (primary) hypertension: Secondary | ICD-10-CM

## 2013-10-05 DIAGNOSIS — E785 Hyperlipidemia, unspecified: Secondary | ICD-10-CM

## 2013-10-05 DIAGNOSIS — K219 Gastro-esophageal reflux disease without esophagitis: Secondary | ICD-10-CM

## 2013-10-05 MED ORDER — OMEPRAZOLE 40 MG PO CPDR: 40.0000 mg | DELAYED_RELEASE_CAPSULE | Freq: Every day | ORAL | Status: DC

## 2013-10-05 MED ORDER — LOSARTAN POTASSIUM-HCTZ 100-25 MG PO TABS: 1.0000 | ORAL_TABLET | Freq: Every day | ORAL | Status: DC

## 2013-10-05 MED ORDER — ROSUVASTATIN CALCIUM 10 MG PO TABS: 10.0000 mg | ORAL_TABLET | Freq: Every day | ORAL | Status: DC

## 2013-10-05 MED ORDER — AMLODIPINE BESYLATE 10 MG PO TABS: 10.0000 mg | ORAL_TABLET | Freq: Every day | ORAL | Status: DC

## 2013-10-05 NOTE — Telephone Encounter (Signed)
Called in.

## 2013-10-05 NOTE — Telephone Encounter (Signed)
Called and lm at pharmacy

## 2013-10-05 NOTE — Progress Notes (Signed)
Subjective:    Patient ID: Jason Harper, male    DOB: 07-08-1971, 43 y.o.   MRN: [id number]  Patient here tdoay for follow up - no changes since last visit- no complaints today  Hypertension This is a chronic problem. The current episode started more than 1 year ago. The problem is unchanged. The problem is controlled. Pertinent negatives include no anxiety, blurred vision, chest pain, headaches, neck pain, palpitations, peripheral edema or shortness of breath. There are no associated agents to hypertension. Risk factors for coronary artery disease include dyslipidemia, family history and male gender. Past treatments include diuretics, angiotensin blockers and calcium channel blockers. The current treatment provides significant improvement. Compliance problems include diet and exercise.  There is no history of chronic renal disease.  Hyperlipidemia This is a chronic problem. The current episode started more than 1 year ago. The problem is uncontrolled. Recent lipid tests were reviewed and are high. He has no history of chronic renal disease, diabetes or obesity. Factors aggravating his hyperlipidemia include thiazides. Pertinent negatives include no chest pain or shortness of breath. Current antihyperlipidemic treatment includes statins. The current treatment provides moderate improvement of lipids. Compliance problems include adherence to diet and adherence to exercise.  Risk factors for coronary artery disease include hypertension and male sex.  GERD New problem- started in February just intermittently- No difficulty swallowing- Burning sensation in chest - wakes him up at night on occasion. Taking TUMS that helps.   Review of Systems  Eyes: Negative for blurred vision.  Respiratory: Negative for shortness of breath.   Cardiovascular: Negative for chest pain and palpitations.  Musculoskeletal: Negative for neck pain.  Neurological: Negative for headaches.  All other systems reviewed and are  negative.       Objective:   Physical Exam  Constitutional: He is oriented to person, place, and time. He appears well-developed and well-nourished.  HENT:  Head: Normocephalic.  Right Ear: External ear normal.  Left Ear: External ear normal.  Nose: Nose normal.  Mouth/Throat: Oropharynx is clear and moist.  Eyes: EOM are normal. Pupils are equal, round, and reactive to light.  Neck: Normal range of motion. Neck supple. No thyromegaly present.  Cardiovascular: Normal rate, regular rhythm, normal heart sounds and intact distal pulses.   No murmur heard. Pulmonary/Chest: Effort normal and breath sounds normal. He has no wheezes. He has no rales.  Abdominal: Soft. Bowel sounds are normal.  Musculoskeletal: Normal range of motion.  Neurological: He is alert and oriented to person, place, and time.  Skin: Skin is warm and dry.  Psychiatric: He has a normal mood and affect. His behavior is normal. Judgment and thought content normal.    BP 121/83  Pulse 71  Temp(Src) 97.1 F (36.2 C) (Oral)  Ht _0  (1.651 m)  Wt 229 lb (103.874 kg)  BMI 38.11 kg/m2       Assessment & Plan:    1. Hypertension   2. Hyperlipidemia LDL goal < 100   3. GERD (gastroesophageal reflux disease)    Orders Placed This Encounter  Procedures  . CMP14+EGFR  . NMR, lipoprofile   Meds ordered this encounter  Medications  . amLODipine (NORVASC) 10 MG tablet    Sig: Take 1 tablet (10 mg total) by mouth daily.    Dispense:  30 tablet    Refill:  5    Order Specific Question:  Supervising Provider    Answer:  Chipper Herb [[id number]]  . losartan-hydrochlorothiazide (HYZAAR) 100-25 MG per tablet  Sig: Take 1 tablet by mouth daily.    Dispense:  30 tablet    Refill:  5    Order Specific Question:  Supervising Provider    Answer:  Chipper Herb [[id number]]  . omeprazole (PRILOSEC) 40 MG capsule    Sig: Take 1 capsule (40 mg total) by mouth daily.    Dispense:  30 capsule    Refill:  5    Order  Specific Question:  Supervising Provider    Answer:  Chipper Herb [[id number]]  . rosuvastatin (CRESTOR) 10 MG tablet    Sig: Take 1 tablet (10 mg total) by mouth daily.    Dispense:  28 tablet    Refill:  5    Order Specific Question:  Supervising Provider    Answer:  Chipper Herb [[id number]]    Labs pending Health maintenance reviewed Diet and exercise encouraged Continue all meds Follow up  In 3 months   [place], FNP

## 2013-10-06 LAB — CMP14+EGFR
A/G RATIO: 2.2 (ref 1.1–2.5)
ALBUMIN: 4.6 g/dL (ref 3.5–5.5)
ALT: 23 IU/L (ref 0–44)
AST: 22 IU/L (ref 0–40)
Alkaline Phosphatase: 54 IU/L (ref 39–117)
BUN/Creatinine Ratio: 12 (ref 9–20)
BUN: 11 mg/dL (ref 6–24)
CALCIUM: 9.2 mg/dL (ref 8.7–10.2)
CO2: 24 mmol/L (ref 18–29)
CREATININE: 0.94 mg/dL (ref 0.76–1.27)
Chloride: 102 mmol/L (ref 97–108)
GFR calc Af Amer: 117 mL/min/{1.73_m2} (ref >59)
GFR, EST NON AFRICAN AMERICAN: 101 mL/min/{1.73_m2} (ref >59)
GLOBULIN, TOTAL: 2.1 g/dL (ref 1.5–4.5)
Glucose: 96 mg/dL (ref 65–99)
Potassium: 4.4 mmol/L (ref 3.5–5.2)
Sodium: 141 mmol/L (ref 134–144)
Total Bilirubin: 0.6 mg/dL (ref 0.0–1.2)
Total Protein: 6.7 g/dL (ref 6.0–8.5)

## 2013-10-06 LAB — NMR, LIPOPROFILE
CHOLESTEROL: 143 mg/dL (ref <200)
HDL CHOLESTEROL BY NMR: 30 mg/dL — AB (ref >=40)
HDL PARTICLE NUMBER: 24.8 umol/L — AB (ref >=30.5)
LDL Particle Number: 1485 nmol/L — ABNORMAL HIGH (ref <1000)
LDL Size: 19.8 nm — ABNORMAL LOW (ref >20.5)
LDLC SERPL CALC-MCNC: 89 mg/dL (ref <100)
LP-IR Score: 59 — ABNORMAL HIGH (ref <=45)
Small LDL Particle Number: 1133 nmol/L — ABNORMAL HIGH (ref <=527)
TRIGLYCERIDES BY NMR: 118 mg/dL (ref <150)

## 2013-11-06 ENCOUNTER — Encounter: Payer: Self-pay | Admitting: Nurse Practitioner

## 2013-11-06 ENCOUNTER — Ambulatory Visit (INDEPENDENT_AMBULATORY_CARE_PROVIDER_SITE_OTHER): Payer: BC Managed Care – PPO | Admitting: Nurse Practitioner

## 2013-11-06 VITALS — BP 117/74 | HR 66 | Temp 97.6°F | Ht 65.0 in | Wt 224.0 lb

## 2013-11-06 DIAGNOSIS — L03012 Cellulitis of left finger: Secondary | ICD-10-CM

## 2013-11-06 DIAGNOSIS — L03019 Cellulitis of unspecified finger: Secondary | ICD-10-CM

## 2013-11-06 MED ORDER — CEPHALEXIN 500 MG PO CAPS: 500.0000 mg | ORAL_CAPSULE | Freq: Three times a day (TID) | ORAL | Status: DC

## 2013-11-06 NOTE — Patient Instructions (Signed)
Paronychia Paronychia is an inflammatory reaction involving the folds of the skin surrounding the fingernail. This is commonly caused by an infection in the skin around a nail. The most common cause of paronychia is frequent wetting of the hands (as seen with bartenders, food servers, nurses or others who wet their hands). This makes the skin around the fingernail susceptible to infection by bacteria (germs) or fungus. Other predisposing factors are:  Aggressive manicuring.  Nail biting.  Thumb sucking. The most common cause is a staphylococcal (a type of germ) infection, or a fungal (Candida) infection. When caused by a germ, it usually comes on suddenly with redness, swelling, pus and is often painful. It may get under the nail and form an abscess (collection of pus), or form an abscess around the nail. If the nail itself is infected with a fungus, the treatment is usually prolonged and may require oral medicine for up to one year. Your caregiver will determine the length of time treatment is required. The paronychia caused by bacteria (germs) may largely be avoided by not pulling on hangnails or picking at cuticles. When the infection occurs at the tips of the finger it is called felon. When the cause of paronychia is from the herpes simplex virus (HSV) it is called herpetic whitlow. TREATMENT  When an abscess is present treatment is often incision and drainage. This means that the abscess must be cut open so the pus can get out. When this is done, the following home care instructions should be followed. HOME CARE INSTRUCTIONS   It is important to keep the affected fingers very dry. Rubber or plastic gloves over cotton gloves should be used whenever the hand must be placed in water.  Keep wound clean, dry and dressed as suggested by your caregiver between warm soaks or warm compresses.  Soak in warm water for fifteen to twenty minutes three to four times per day for bacterial infections. Fungal  infections are very difficult to treat, so often require treatment for long periods of time.  For bacterial (germ) infections take antibiotics (medicine which kill germs) as directed and finish the prescription, even if the problem appears to be solved before the medicine is gone.  Only take over-the-counter or prescription medicines for pain, discomfort, or fever as directed by your caregiver. SEEK IMMEDIATE MEDICAL CARE IF:  You have redness, swelling, or increasing pain in the wound.  You notice pus coming from the wound.  You have a fever.  You notice a bad smell coming from the wound or dressing. Document Released: 12/26/2000 Document Revised: 09/24/2011 Document Reviewed: 08/27/2008 Exit[employer] Patient Information 2014 [employer].  

## 2013-11-06 NOTE — Progress Notes (Signed)
   Subjective:    Patient ID: Jason Harper, male    DOB: 05/10/1971, 43 y.o.   MRN: [id number]  HPI Patient in c/o infected left fifth finger- started about 3 days ago- He has used neosporin and peroxide but just getting worse.    Review of Systems  Respiratory: Negative.   Cardiovascular: Negative.   All other systems reviewed and are negative.      Objective:   Physical Exam  Constitutional: He appears well-developed and well-nourished.  Cardiovascular: Normal rate and normal heart sounds.   Pulmonary/Chest: Effort normal and breath sounds normal.  Skin:  Erythematous  And edematous nail border left fifth finger   BP 117/74  Pulse 66  Temp(Src) 97.6 F (36.4 C) (Oral)  Ht 5\' 5"  (1.651 m)  Wt 224 lb (101.606 kg)  BMI 37.28 kg/m2        Assessment & Plan:   1. Paronychia of fifth finger of left hand    Meds ordered this encounter  Medications  . cephALEXin (KEFLEX) 500 MG capsule    Sig: Take 1 capsule (500 mg total) by mouth 3 (three) times daily.    Dispense:  30 capsule    Refill:  0    Order Specific Question:  Supervising Provider    Answer:  Deborra MedinaMOORE, DONALD W [[id number]]   Soak in epsom salt BID Do not pick at Rto if not improving  Mary-Margaret Daphine DeutscherMartin, FNP

## 2014-01-12 ENCOUNTER — Encounter: Payer: Self-pay | Admitting: Nurse Practitioner

## 2014-01-12 ENCOUNTER — Ambulatory Visit (INDEPENDENT_AMBULATORY_CARE_PROVIDER_SITE_OTHER): Payer: BC Managed Care – PPO | Admitting: Nurse Practitioner

## 2014-01-12 VITALS — BP 128/84 | HR 67 | Temp 98.1°F | Ht 65.0 in | Wt 220.0 lb

## 2014-01-12 DIAGNOSIS — I10 Essential (primary) hypertension: Secondary | ICD-10-CM

## 2014-01-12 DIAGNOSIS — K219 Gastro-esophageal reflux disease without esophagitis: Secondary | ICD-10-CM

## 2014-01-12 DIAGNOSIS — E785 Hyperlipidemia, unspecified: Secondary | ICD-10-CM

## 2014-01-12 NOTE — Progress Notes (Signed)
  Subjective:    Patient ID: Jason Harper, male    DOB: 1971/01/31, 43 y.o.   MRN: [id number]  Patient here tdoay for follow up - no changes since last visit- no complaints today  Hypertension This is a chronic problem. The current episode started more than 1 year ago. The problem is unchanged. The problem is controlled. Pertinent negatives include no anxiety, blurred vision, chest pain, headaches, neck pain, palpitations, peripheral edema or shortness of breath. There are no associated agents to hypertension. Risk factors for coronary artery disease include dyslipidemia, family history and male gender. Past treatments include diuretics, angiotensin blockers and calcium channel blockers. The current treatment provides significant improvement. Compliance problems include diet and exercise.  There is no history of chronic renal disease.  Hyperlipidemia This is a chronic problem. The current episode started more than 1 year ago. The problem is uncontrolled. Recent lipid tests were reviewed and are high. He has no history of chronic renal disease, diabetes or obesity. Factors aggravating his hyperlipidemia include thiazides. Pertinent negatives include no chest pain or shortness of breath. Current antihyperlipidemic treatment includes statins. The current treatment provides moderate improvement of lipids. Compliance problems include adherence to diet and adherence to exercise.  Risk factors for coronary artery disease include hypertension and male sex.  GERD New problem- started in February just intermittently- No difficulty swallowing- he reported taking omeprazole $RemoveBeforeDEI'40mg'[id number]$  daily which is helping.    Review of Systems  Eyes: Negative for blurred vision.  Respiratory: Negative for shortness of breath.   Cardiovascular: Negative for chest pain and palpitations.  Musculoskeletal: Negative for neck pain.  Neurological: Negative for headaches.  All other systems reviewed and are negative.      Objective:    Physical Exam  Constitutional: He is oriented to person, place, and time. He appears well-developed and well-nourished.  HENT:  Head: Normocephalic.  Right Ear: External ear normal.  Left Ear: External ear normal.  Nose: Nose normal.  Mouth/Throat: Oropharynx is clear and moist.  Eyes: EOM are normal. Pupils are equal, round, and reactive to light.  Neck: Normal range of motion. Neck supple. No thyromegaly present.  Cardiovascular: Normal rate, regular rhythm, normal heart sounds and intact distal pulses.   No murmur heard. Pulmonary/Chest: Effort normal and breath sounds normal. He has no wheezes. He has no rales.  Abdominal: Soft. Bowel sounds are normal.  Musculoskeletal: Normal range of motion.  Neurological: He is alert and oriented to person, place, and time.  Skin: Skin is warm and dry.  Psychiatric: He has a normal mood and affect. His behavior is normal. Judgment and thought content normal.    BP 128/84  Pulse 67  Temp(Src) 98.1 F (36.7 C) (Oral)  Ht $R'5\' 5"'ch$  (1.651 m)  Wt 220 lb (99.791 kg)  BMI 36.61 kg/m2         Assessment & Plan:   1. Essential hypertension   2. Hyperlipidemia with target LDL less than 100   3. Gastroesophageal reflux disease without esophagitis    Orders Placed This Encounter  Procedures  . CMP14+EGFR  . NMR, lipoprofile    Labs pending Health maintenance reviewed Diet and exercise encouraged Continue all meds Follow up  In 3 months PRN.    Mary-Margaret Hassell Done, FNP

## 2014-01-12 NOTE — Patient Instructions (Signed)
Hypertension Hypertension, commonly called high blood pressure, is when the force of blood pumping through your arteries is too strong. Your arteries are the blood vessels that carry blood from your heart throughout your body. A blood pressure reading consists of a higher number over a lower number, such as 110/72. The higher number (systolic) is the pressure inside your arteries when your heart pumps. The lower number (diastolic) is the pressure inside your arteries when your heart relaxes. Ideally you want your blood pressure below 120/80. Hypertension forces your heart to work harder to pump blood. Your arteries may become narrow or stiff. Having hypertension puts you at risk for heart disease, stroke, and other problems.  RISK FACTORS Some risk factors for high blood pressure are controllable. Others are not.  Risk factors you cannot control include:   Race. You may be at higher risk if you are African American.  Age. Risk increases with age.  Gender. Men are at higher risk than women before age 45 years. After age 65, women are at higher risk than men. Risk factors you can control include:  Not getting enough exercise or physical activity.  Being overweight.  Getting too much fat, sugar, calories, or salt in your diet.  Drinking too much alcohol. SIGNS AND SYMPTOMS Hypertension does not usually cause signs or symptoms. Extremely high blood pressure (hypertensive crisis) may cause headache, anxiety, shortness of breath, and nosebleed. DIAGNOSIS  To check if you have hypertension, your health care provider will measure your blood pressure while you are seated, with your arm held at the level of your heart. It should be measured at least twice using the same arm. Certain conditions can cause a difference in blood pressure between your right and left arms. A blood pressure reading that is higher than normal on one occasion does not mean that you need treatment. If one blood pressure reading  is high, ask your health care provider about having it checked again. TREATMENT  Treating high blood pressure includes making lifestyle changes and possibly taking medication. Living a healthy lifestyle can help lower high blood pressure. You may need to change some of your habits. Lifestyle changes may include:  Following the DASH diet. This diet is high in fruits, vegetables, and whole grains. It is low in salt, red meat, and added sugars.  Getting at least 2 1/2 hours of brisk physical activity every week.  Losing weight if necessary.  Not smoking.  Limiting alcoholic beverages.  Learning ways to reduce stress. If lifestyle changes are not enough to get your blood pressure under control, your health care provider may prescribe medicine. You may need to take more than one. Work closely with your health care provider to understand the risks and benefits. HOME CARE INSTRUCTIONS  Have your blood pressure rechecked as directed by your health care provider.   Only take medicine as directed by your health care provider. Follow the directions carefully. Blood pressure medicines must be taken as prescribed. The medicine does not work as well when you skip doses. Skipping doses also puts you at risk for problems.   Do not smoke.   Monitor your blood pressure at home as directed by your health care provider. SEEK MEDICAL CARE IF:   You think you are having a reaction to medicines taken.  You have recurrent headaches or feel dizzy.  You have swelling in your ankles.  You have trouble with your vision. SEEK IMMEDIATE MEDICAL CARE IF:  You develop a severe headache or   confusion.  You have unusual weakness, numbness, or feel faint.  You have severe chest or abdominal pain.  You vomit repeatedly.  You have trouble breathing. MAKE SURE YOU:   Understand these instructions.  Will watch your condition.  Will get help right away if you are not doing well or get  worse. Document Released: 07/02/2005 Document Revised: 07/07/2013 Document Reviewed: 04/24/2013 Exit[employer] Patient Information 2015 [employer]. This information is not intended to replace advice given to you by your health care provider. Make sure you discuss any questions you have with your health care provider.  

## 2014-01-18 LAB — CMP14+EGFR
A/G RATIO: 2.1 (ref 1.1–2.5)
ALK PHOS: 55 IU/L (ref 39–117)
ALT: 20 IU/L (ref 0–44)
AST: 20 IU/L (ref 0–40)
Albumin: 4.4 g/dL (ref 3.5–5.5)
BILIRUBIN TOTAL: 0.4 mg/dL (ref 0.0–1.2)
BUN / CREAT RATIO: 10 (ref 9–20)
BUN: 11 mg/dL (ref 6–24)
CO2: 24 mmol/L (ref 18–29)
Calcium: 9.2 mg/dL (ref 8.7–10.2)
Chloride: 98 mmol/L (ref 97–108)
Creatinine, Ser: 1.14 mg/dL (ref 0.76–1.27)
GFR, EST AFRICAN AMERICAN: 92 mL/min/{1.73_m2} (ref >59)
GFR, EST NON AFRICAN AMERICAN: 80 mL/min/{1.73_m2} (ref >59)
Globulin, Total: 2.1 g/dL (ref 1.5–4.5)
Glucose: 91 mg/dL (ref 65–99)
POTASSIUM: 4.1 mmol/L (ref 3.5–5.2)
SODIUM: 139 mmol/L (ref 134–144)
TOTAL PROTEIN: 6.5 g/dL (ref 6.0–8.5)

## 2014-01-18 LAB — NMR, LIPOPROFILE
Cholesterol: 136 mg/dL (ref 100–199)
HDL Cholesterol by NMR: 26 mg/dL — ABNORMAL LOW (ref >39)
HDL Particle Number: 23.2 umol/L — ABNORMAL LOW (ref >=30.5)
LDL Particle Number: 1165 nmol/L — ABNORMAL HIGH (ref <1000)
LDL SIZE: 20.3 nm (ref >20.5)
LDLC SERPL CALC-MCNC: 83 mg/dL (ref 0–99)
LP-IR SCORE: 71 — AB (ref <=45)
SMALL LDL PARTICLE NUMBER: 692 nmol/L — AB (ref <=527)
TRIGLYCERIDES BY NMR: 137 mg/dL (ref 0–149)

## 2014-01-19 ENCOUNTER — Telehealth: Payer: Self-pay | Admitting: Family Medicine

## 2014-01-19 NOTE — Telephone Encounter (Signed)
Message copied by Azalee CourseFULP, Benett Swoyer on Tue Jan 19, 2014 10:15 AM ------      Message from: Bennie PieriniMARTIN, MARY-MARGARET      Created: Tue Jan 19, 2014  7:59 AM       Kidney and liver function stable      LDL particle numbers and LDL good      Continue current meds- low fat diet and exercise and recheck in 3 months       ------

## 2014-01-19 NOTE — Telephone Encounter (Signed)
Patient aware.

## 2014-04-19 ENCOUNTER — Encounter: Payer: Self-pay | Admitting: Nurse Practitioner

## 2014-04-19 ENCOUNTER — Ambulatory Visit (INDEPENDENT_AMBULATORY_CARE_PROVIDER_SITE_OTHER): Payer: BC Managed Care – PPO | Admitting: Nurse Practitioner

## 2014-04-19 VITALS — BP 117/73 | HR 61 | Temp 97.7°F | Ht 66.0 in | Wt 220.4 lb

## 2014-04-19 DIAGNOSIS — I10 Essential (primary) hypertension: Secondary | ICD-10-CM

## 2014-04-19 DIAGNOSIS — K219 Gastro-esophageal reflux disease without esophagitis: Secondary | ICD-10-CM

## 2014-04-19 DIAGNOSIS — E785 Hyperlipidemia, unspecified: Secondary | ICD-10-CM

## 2014-04-19 DIAGNOSIS — Z23 Encounter for immunization: Secondary | ICD-10-CM

## 2014-04-19 MED ORDER — OMEPRAZOLE 40 MG PO CPDR: 40.0000 mg | DELAYED_RELEASE_CAPSULE | Freq: Every day | ORAL | Status: DC

## 2014-04-19 MED ORDER — ROSUVASTATIN CALCIUM 10 MG PO TABS: 10.0000 mg | ORAL_TABLET | Freq: Every day | ORAL | Status: DC

## 2014-04-19 MED ORDER — AMLODIPINE BESYLATE 10 MG PO TABS: 10.0000 mg | ORAL_TABLET | Freq: Every day | ORAL | Status: DC

## 2014-04-19 MED ORDER — LOSARTAN POTASSIUM-HCTZ 100-25 MG PO TABS: 1.0000 | ORAL_TABLET | Freq: Every day | ORAL | Status: DC

## 2014-04-19 NOTE — Progress Notes (Signed)
Subjective:    Patient ID: Jason Harper, male    DOB: 25-Jan-1971, 43 y.o.   MRN: [id number]  Patient here tdoay for follow up - no changes since last visit- no complaints today  Hypertension This is a chronic problem. The current episode started more than 1 year ago. The problem is unchanged. The problem is controlled. Pertinent negatives include no anxiety, blurred vision, chest pain, headaches, neck pain, palpitations, peripheral edema or shortness of breath. There are no associated agents to hypertension. Risk factors for coronary artery disease include dyslipidemia, family history and male gender. Past treatments include diuretics, angiotensin blockers and calcium channel blockers. The current treatment provides significant improvement. Compliance problems include diet and exercise.  There is no history of chronic renal disease.  Hyperlipidemia This is a chronic problem. The current episode started more than 1 year ago. The problem is uncontrolled. Recent lipid tests were reviewed and are high. He has no history of chronic renal disease, diabetes or obesity. Factors aggravating his hyperlipidemia include thiazides. Pertinent negatives include no chest pain or shortness of breath. Current antihyperlipidemic treatment includes statins. The current treatment provides moderate improvement of lipids. Compliance problems include adherence to diet and adherence to exercise.  Risk factors for coronary artery disease include hypertension and male sex.  GERD Reports doing well with prilosec, he monitor his diet.    Review of Systems  Eyes: Negative for blurred vision.  Respiratory: Negative for shortness of breath.   Cardiovascular: Negative for chest pain and palpitations.  Musculoskeletal: Negative for neck pain.  Neurological: Negative for headaches.  All other systems reviewed and are negative.      Objective:   Physical Exam  Constitutional: He is oriented to person, place, and time. He  appears well-developed and well-nourished.  HENT:  Head: Normocephalic.  Right Ear: External ear normal.  Left Ear: External ear normal.  Nose: Nose normal.  Mouth/Throat: Oropharynx is clear and moist.  Eyes: EOM are normal. Pupils are equal, round, and reactive to light.  Neck: Normal range of motion. Neck supple. No thyromegaly present.  Cardiovascular: Normal rate, regular rhythm, normal heart sounds and intact distal pulses.   No murmur heard. Pulmonary/Chest: Effort normal and breath sounds normal. He has no wheezes. He has no rales.  Abdominal: Soft. Bowel sounds are normal.  Musculoskeletal: Normal range of motion.  Neurological: He is alert and oriented to person, place, and time.  Skin: Skin is warm and dry.  Psychiatric: He has a normal mood and affect. His behavior is normal. Judgment and thought content normal.   BP 117/73  Pulse 61  Temp(Src) 97.7 F (36.5 C) (Oral)  Ht _0  (1.676 m)  Wt 220 lb 6.4 oz (99.973 kg)  BMI 35.59 kg/m2       Assessment & Plan:  1. Essential hypertension - BMP8+EGFR - losartan-hydrochlorothiazide (HYZAAR) 100-25 MG per tablet; Take 1 tablet by mouth daily.  Dispense: 30 tablet; Refill: 5 - amLODipine (NORVASC) 10 MG tablet; Take 1 tablet (10 mg total) by mouth daily.  Dispense: 30 tablet; Refill: 5  2. Hyperlipidemia with target LDL less than 100 - NMR, lipoprofile - rosuvastatin (CRESTOR) 10 MG tablet; Take 1 tablet (10 mg total) by mouth daily.  Dispense: 28 tablet; Refill: 5  3. Gastroesophageal reflux disease, esophagitis presence not specified - omeprazole (PRILOSEC) 40 MG capsule; Take 1 capsule (40 mg total) by mouth daily.  Dispense: 30 capsule; Refill: 5    Labs pending Health maintenance reviewed Diet and exercise  encouraged Continue all meds Follow up  In 3 months PRN.    Mary-Margaret Hassell Done, FNP

## 2014-04-19 NOTE — Patient Instructions (Signed)

## 2014-04-20 ENCOUNTER — Telehealth: Payer: Self-pay | Admitting: Family Medicine

## 2014-04-20 LAB — BMP8+EGFR
BUN / CREAT RATIO: 11 (ref 9–20)
BUN: 12 mg/dL (ref 6–24)
CHLORIDE: 99 mmol/L (ref 97–108)
CO2: 23 mmol/L (ref 18–29)
Calcium: 9.2 mg/dL (ref 8.7–10.2)
Creatinine, Ser: 1.12 mg/dL (ref 0.76–1.27)
GFR calc Af Amer: 94 mL/min/{1.73_m2} (ref >59)
GFR calc non Af Amer: 81 mL/min/{1.73_m2} (ref >59)
Glucose: 90 mg/dL (ref 65–99)
Potassium: 3.9 mmol/L (ref 3.5–5.2)
SODIUM: 140 mmol/L (ref 134–144)

## 2014-04-20 LAB — NMR, LIPOPROFILE
Cholesterol: 127 mg/dL (ref 100–199)
HDL Cholesterol by NMR: 30 mg/dL — ABNORMAL LOW (ref >39)
HDL PARTICLE NUMBER: 29.1 umol/L — AB (ref >=30.5)
LDL Particle Number: 1136 nmol/L — ABNORMAL HIGH (ref <1000)
LDL Size: 19.9 nm (ref >20.5)
LDLC SERPL CALC-MCNC: 79 mg/dL (ref 0–99)
LP-IR SCORE: 64 — AB (ref <=45)
SMALL LDL PARTICLE NUMBER: 837 nmol/L — AB (ref <=527)
Triglycerides by NMR: 89 mg/dL (ref 0–149)

## 2014-04-20 NOTE — Telephone Encounter (Signed)
Message copied by Azalee CourseFULP, Kennedy Bohanon on Tue Apr 20, 2014 10:00 AM ------      Message from: Bennie PieriniMARTIN, MARY-MARGARET      Created: Tue Apr 20, 2014  8:59 AM       Kidney and liver function stable      Cholesterol looks great      Continue current meds- low fat diet and exercise and recheck in 3 months             ------

## 2014-04-29 ENCOUNTER — Other Ambulatory Visit: Payer: Self-pay | Admitting: Nurse Practitioner

## 2014-04-29 ENCOUNTER — Telehealth: Payer: Self-pay | Admitting: Nurse Practitioner

## 2014-04-29 DIAGNOSIS — E785 Hyperlipidemia, unspecified: Secondary | ICD-10-CM

## 2014-04-30 MED ORDER — ROSUVASTATIN CALCIUM 10 MG PO TABS: 10.0000 mg | ORAL_TABLET | Freq: Every day | ORAL | Status: DC

## 2014-04-30 NOTE — Telephone Encounter (Signed)
rx sent to pharmacy

## 2014-07-26 ENCOUNTER — Ambulatory Visit (INDEPENDENT_AMBULATORY_CARE_PROVIDER_SITE_OTHER): Payer: BLUE CROSS/BLUE SHIELD | Admitting: Nurse Practitioner

## 2014-07-26 ENCOUNTER — Encounter: Payer: Self-pay | Admitting: Nurse Practitioner

## 2014-07-26 VITALS — BP 121/80 | HR 61 | Temp 97.1°F | Ht 66.0 in | Wt 228.0 lb

## 2014-07-26 DIAGNOSIS — E785 Hyperlipidemia, unspecified: Secondary | ICD-10-CM

## 2014-07-26 DIAGNOSIS — K219 Gastro-esophageal reflux disease without esophagitis: Secondary | ICD-10-CM

## 2014-07-26 DIAGNOSIS — N410 Acute prostatitis: Secondary | ICD-10-CM

## 2014-07-26 DIAGNOSIS — R300 Dysuria: Secondary | ICD-10-CM

## 2014-07-26 DIAGNOSIS — I10 Essential (primary) hypertension: Secondary | ICD-10-CM

## 2014-07-26 DIAGNOSIS — Z125 Encounter for screening for malignant neoplasm of prostate: Secondary | ICD-10-CM

## 2014-07-26 LAB — POCT UA - MICROSCOPIC ONLY
Bacteria, U Microscopic: NEGATIVE
CASTS, UR, LPF, POC: NEGATIVE
Crystals, Ur, HPF, POC: NEGATIVE
Mucus, UA: NEGATIVE
RBC, URINE, MICROSCOPIC: NEGATIVE
WBC, Ur, HPF, POC: NEGATIVE
YEAST UA: NEGATIVE

## 2014-07-26 LAB — POCT URINALYSIS DIPSTICK
Bilirubin, UA: NEGATIVE
Glucose, UA: NEGATIVE
KETONES UA: NEGATIVE
Leukocytes, UA: NEGATIVE
Nitrite, UA: NEGATIVE
Protein, UA: NEGATIVE
RBC UA: NEGATIVE
SPEC GRAV UA: 1.015
Urobilinogen, UA: NEGATIVE
pH, UA: 6.5

## 2014-07-26 MED ORDER — ROSUVASTATIN CALCIUM 10 MG PO TABS: 10.0000 mg | ORAL_TABLET | Freq: Every day | ORAL | Status: DC

## 2014-07-26 MED ORDER — AMLODIPINE BESYLATE 10 MG PO TABS: 10.0000 mg | ORAL_TABLET | Freq: Every day | ORAL | Status: DC

## 2014-07-26 MED ORDER — SULFAMETHOXAZOLE-TRIMETHOPRIM 800-160 MG PO TABS: 1.0000 | ORAL_TABLET | Freq: Two times a day (BID) | ORAL | Status: DC

## 2014-07-26 MED ORDER — OMEPRAZOLE 40 MG PO CPDR: 40.0000 mg | DELAYED_RELEASE_CAPSULE | Freq: Every day | ORAL | Status: DC

## 2014-07-26 MED ORDER — LOSARTAN POTASSIUM-HCTZ 100-25 MG PO TABS: 1.0000 | ORAL_TABLET | Freq: Every day | ORAL | Status: DC

## 2014-07-26 NOTE — Progress Notes (Signed)
Subjective:    Patient ID: Jason Harper, male    DOB: 1971-03-03, 44 y.o.   MRN: [id number]  Patient here tdoay for follow up - no changes since last visit- C/O low back pain with pain and burning with urination. urien is dark and foul smelling.  Hypertension This is a chronic problem. The current episode started more than 1 year ago. The problem is unchanged. The problem is controlled. Pertinent negatives include no neck pain, palpitations or shortness of breath. Risk factors for coronary artery disease include dyslipidemia, male gender and obesity. Past treatments include calcium channel blockers, angiotensin blockers and diuretics. The current treatment provides moderate improvement. Compliance problems include diet and exercise.   Hyperlipidemia This is a chronic problem. The current episode started more than 1 year ago. The problem is controlled. Recent lipid tests were reviewed and are variable. Pertinent negatives include no shortness of breath. Current antihyperlipidemic treatment includes statins. The current treatment provides moderate improvement of lipids. Compliance problems include adherence to diet and adherence to exercise.  Risk factors for coronary artery disease include dyslipidemia, hypertension, male sex and obesity.  GERD Reports doing well with prilosec, he monitor his diet.    Review of Systems  Constitutional: Negative.   HENT: Negative.   Respiratory: Negative for shortness of breath.   Cardiovascular: Negative for palpitations.  Genitourinary: Positive for dysuria, urgency and frequency.  Musculoskeletal: Negative for neck pain.  Neurological: Negative.   Psychiatric/Behavioral: Negative.   All other systems reviewed and are negative.      Objective:   Physical Exam  Constitutional: He is oriented to person, place, and time. He appears well-developed and well-nourished.  HENT:  Head: Normocephalic.  Right Ear: External ear normal.  Left Ear: External ear  normal.  Nose: Nose normal.  Mouth/Throat: Oropharynx is clear and moist.  Eyes: EOM are normal. Pupils are equal, round, and reactive to light.  Neck: Normal range of motion. Neck supple. No JVD present. No thyromegaly present.  Cardiovascular: Normal rate, regular rhythm, normal heart sounds and intact distal pulses.  Exam reveals no gallop and no friction rub.   No murmur heard. Pulmonary/Chest: Effort normal and breath sounds normal. No respiratory distress. He has no wheezes. He has no rales. He exhibits no tenderness.  Abdominal: Soft. Bowel sounds are normal. He exhibits no mass. There is tenderness (mild suprapubic pain on palpation).  Genitourinary: Penis normal.  No CVAtenderness  Musculoskeletal: Normal range of motion. He exhibits no edema.  Lymphadenopathy:    He has no cervical adenopathy.  Neurological: He is alert and oriented to person, place, and time. No cranial nerve deficit.  Skin: Skin is warm and dry.  Psychiatric: He has a normal mood and affect. His behavior is normal. Judgment and thought content normal.   BP 121/80 mmHg  Pulse 61  Temp(Src) 97.1 F (36.2 C) (Oral)  Ht _0  (1.676 m)  Wt 228 lb (103.42 kg)  BMI 36.82 kg/m2  Results for orders placed or performed in visit on 07/26/14  POCT UA - Microscopic Only  Result Value Ref Range   WBC, Ur, HPF, POC NEG    RBC, urine, microscopic NEG    Bacteria, U Microscopic NEG    Mucus, UA NEG    Epithelial cells, urine per micros RARE    Crystals, Ur, HPF, POC NEG    Casts, Ur, LPF, POC NEG    Yeast, UA NEG   POCT urinalysis dipstick  Result Value Ref Range   Color,  UA STRAW    Clarity, UA CLEAR    Glucose, UA NEG    Bilirubin, UA NEG    Ketones, UA NEG    Spec Grav, UA 1.015    Blood, UA NEG    pH, UA 6.5    Protein, UA NEG    Urobilinogen, UA negative    Nitrite, UA NEG    Leukocytes, UA Negative         Assessment & Plan:  1. Essential hypertension Do  Not add salt  To diet -  CMP14+EGFR - amLODipine (NORVASC) 10 MG tablet; Take 1 tablet (10 mg total) by mouth daily.  Dispense: 30 tablet; Refill: 5 - losartan-hydrochlorothiazide (HYZAAR) 100-25 MG per tablet; Take 1 tablet by mouth daily.  Dispense: 30 tablet; Refill: 5  2. Gastroesophageal reflux disease without esophagitis Decrease spicy foods in diet  3. Hyperlipidemia with target LDL less than 100 Low fat diet - NMR, lipoprofile - rosuvastatin (CRESTOR) 10 MG tablet; Take 1 tablet (10 mg total) by mouth daily.  Dispense: 90 tablet; Refill: 0  4. Gastroesophageal reflux disease, esophagitis presence not specified - omeprazole (PRILOSEC) 40 MG capsule; Take 1 capsule (40 mg total) by mouth daily.  Dispense: 30 capsule; Refill: 5  5. Dysuria Force fluids - POCT UA - Microscopic Only - POCT urinalysis dipstick  6. Prostate cancer screening - PSA, total and free  7. Acute prostatitis -bactrium ds 1 po #24 o refills    Labs pending Health maintenance reviewed Diet and exercise encouraged Continue all meds Follow up  In 3 months   [place], FNP

## 2014-07-26 NOTE — Patient Instructions (Signed)

## 2014-07-27 LAB — CMP14+EGFR
A/G RATIO: 1.7 (ref 1.1–2.5)
ALBUMIN: 4.5 g/dL (ref 3.5–5.5)
ALT: 28 IU/L (ref 0–44)
AST: 26 IU/L (ref 0–40)
Alkaline Phosphatase: 61 IU/L (ref 39–117)
BILIRUBIN TOTAL: 0.5 mg/dL (ref 0.0–1.2)
BUN / CREAT RATIO: 12 (ref 9–20)
BUN: 13 mg/dL (ref 6–24)
CO2: 25 mmol/L (ref 18–29)
CREATININE: 1.09 mg/dL (ref 0.76–1.27)
Calcium: 9.7 mg/dL (ref 8.7–10.2)
Chloride: 101 mmol/L (ref 97–108)
GFR calc non Af Amer: 84 mL/min/{1.73_m2} (ref >59)
GFR, EST AFRICAN AMERICAN: 97 mL/min/{1.73_m2} (ref >59)
GLOBULIN, TOTAL: 2.7 g/dL (ref 1.5–4.5)
Glucose: 85 mg/dL (ref 65–99)
Potassium: 4.2 mmol/L (ref 3.5–5.2)
SODIUM: 140 mmol/L (ref 134–144)
Total Protein: 7.2 g/dL (ref 6.0–8.5)

## 2014-07-27 LAB — NMR, LIPOPROFILE
Cholesterol: 148 mg/dL (ref 100–199)
HDL Cholesterol by NMR: 32 mg/dL — ABNORMAL LOW (ref >39)
HDL Particle Number: 29.4 umol/L — ABNORMAL LOW (ref >=30.5)
LDL Particle Number: 1223 nmol/L — ABNORMAL HIGH (ref <1000)
LDL SIZE: 20.2 nm (ref >20.5)
LDL-C: 97 mg/dL (ref 0–99)
LP-IR SCORE: 67 — AB (ref <=45)
SMALL LDL PARTICLE NUMBER: 765 nmol/L — AB (ref <=527)
TRIGLYCERIDES BY NMR: 94 mg/dL (ref 0–149)

## 2014-07-27 LAB — PSA, TOTAL AND FREE
PSA FREE PCT: 20 %
PSA, Free: 0.18 ng/mL
PSA: 0.9 ng/mL (ref 0.0–4.0)

## 2014-10-29 ENCOUNTER — Ambulatory Visit: Payer: BLUE CROSS/BLUE SHIELD | Admitting: Nurse Practitioner

## 2014-11-01 ENCOUNTER — Ambulatory Visit (INDEPENDENT_AMBULATORY_CARE_PROVIDER_SITE_OTHER): Payer: BLUE CROSS/BLUE SHIELD

## 2014-11-01 ENCOUNTER — Encounter: Payer: Self-pay | Admitting: Nurse Practitioner

## 2014-11-01 ENCOUNTER — Ambulatory Visit (INDEPENDENT_AMBULATORY_CARE_PROVIDER_SITE_OTHER): Payer: BLUE CROSS/BLUE SHIELD | Admitting: Nurse Practitioner

## 2014-11-01 VITALS — BP 116/80 | HR 69 | Temp 97.3°F | Ht 66.0 in | Wt 226.0 lb

## 2014-11-01 DIAGNOSIS — I10 Essential (primary) hypertension: Secondary | ICD-10-CM

## 2014-11-01 DIAGNOSIS — E785 Hyperlipidemia, unspecified: Secondary | ICD-10-CM

## 2014-11-01 DIAGNOSIS — M25572 Pain in left ankle and joints of left foot: Secondary | ICD-10-CM | POA: Diagnosis not present

## 2014-11-01 DIAGNOSIS — K219 Gastro-esophageal reflux disease without esophagitis: Secondary | ICD-10-CM | POA: Diagnosis not present

## 2014-11-01 MED ORDER — ROSUVASTATIN CALCIUM 10 MG PO TABS: 10.0000 mg | ORAL_TABLET | Freq: Every day | ORAL | Status: DC

## 2014-11-01 NOTE — Progress Notes (Signed)
Subjective:    Patient ID: Jason Harper, male    DOB: 05-21-1971, 44 y.o.   MRN: [id number]  Patient here tdoay for follow up of chronic disease.    Hypertension This is a chronic problem. The current episode started more than 1 year ago. The problem is unchanged. The problem is controlled. Pertinent negatives include no neck pain, palpitations or shortness of breath. Risk factors for coronary artery disease include dyslipidemia, male gender and obesity. Past treatments include calcium channel blockers, angiotensin blockers and diuretics. The current treatment provides moderate improvement. Compliance problems include diet and exercise.   Hyperlipidemia This is a chronic problem. The current episode started more than 1 year ago. The problem is controlled. Recent lipid tests were reviewed and are variable. Pertinent negatives include no shortness of breath. Current antihyperlipidemic treatment includes statins. The current treatment provides moderate improvement of lipids. Compliance problems include adherence to diet and adherence to exercise.  Risk factors for coronary artery disease include dyslipidemia, hypertension, male sex and obesity.  GERD Reports doing well with prilosec, he monitor his diet.   *pt reports left foot pain of 6/10. He reports trying to break a fall and twist his left leg. He reports the pain is worse with ambulation. He has tried motrin without relief.   Review of Systems  Constitutional: Negative.   HENT: Negative.   Respiratory: Negative for shortness of breath.   Cardiovascular: Negative.  Negative for palpitations.  Genitourinary: Negative.   Musculoskeletal: Negative for neck pain.       Left foot pain.   Skin: Negative.   Neurological: Negative.   Hematological: Negative.   Psychiatric/Behavioral: Negative.   All other systems reviewed and are negative.      Objective:   Physical Exam  Constitutional: He is oriented to person, place, and time. He appears  well-developed and well-nourished.  HENT:  Head: Normocephalic.  Right Ear: External ear normal.  Left Ear: External ear normal.  Nose: Nose normal.  Mouth/Throat: Oropharynx is clear and moist.  Eyes: EOM are normal. Pupils are equal, round, and reactive to light.  Neck: Normal range of motion. Neck supple. No JVD present. No thyromegaly present.  Cardiovascular: Normal rate, regular rhythm, normal heart sounds and intact distal pulses.  Exam reveals no gallop and no friction rub.   No murmur heard. Pulmonary/Chest: Effort normal and breath sounds normal. No respiratory distress. He has no wheezes. He has no rales. He exhibits no tenderness.  Abdominal: Soft. Bowel sounds are normal. He exhibits no mass.  Genitourinary: Penis normal.  Musculoskeletal: Normal range of motion. He exhibits edema (left foot traces of edema. ) and tenderness (left foot tenderness with external rotation and plantar flexion. ).  Lymphadenopathy:    He has no cervical adenopathy.  Neurological: He is alert and oriented to person, place, and time. No cranial nerve deficit.  Skin: Skin is warm and dry.  Psychiatric: He has a normal mood and affect. His behavior is normal. Judgment and thought content normal.   BP 116/80 mmHg  Pulse 69  Temp(Src) 97.3 F (36.3 C) (Oral)  Ht 5' 6"  (1.676 m)  Wt 226 lb (102.513 kg)  BMI 36.49 kg/m2  Foot xray - no fracture-Preliminary reading by Ronnald Collum, FNP  [facility]     Assessment & Plan:  1. Essential hypertension Do not add salt to diet - CMP14+EGFR  2. Gastroesophageal reflux disease without esophagitis First 24 Hours-Clear liquids  popsicles  Jello  gatorade  Sprite Second 24 hours-Add  Full liquids ( Liquids you cant see through) Third 24 hours- Bland diet ( foods that are baked or broiled)  *avoiding fried foods and highly spiced foods* During these 3 days  Avoid milk, cheese, ice cream or any other dairy products  Avoid caffeine- REMEMBER Mt. Dew and Mello  Yellow contain lots of caffeine You should eat and drink in  Frequent small volumes If no improvement in symptoms or worsen in 2-3 days should RETRUN TO OFFICE or go to ER!     3. Hyperlipidemia with target LDL less than 100 Continue to watch fat in diet - NMR, lipoprofile - rosuvastatin (CRESTOR) 10 MG tablet; Take 1 tablet (10 mg total) by mouth daily.  Dispense: 90 tablet; Refill: 0  4. Pain in joint, ankle and foot, left Elevate Ice  Wrap rest - DG Foot Complete Left; Future    Labs pending Health maintenance reviewed Diet and exercise encouraged Continue all meds Follow up  In 3 month    [place], FNP

## 2014-11-01 NOTE — Patient Instructions (Addendum)
RICE: Routine Care for Injuries The routine care of many injuries includes Rest, Ice, Compression, and Elevation (RICE). HOME CARE INSTRUCTIONS  Rest is needed to allow your body to heal. Routine activities can usually be resumed when comfortable. Injured tendons and bones can take up to 6 weeks to heal. Tendons are the cord-like structures that attach muscle to bone.  Ice following an injury helps keep the swelling down and reduces pain.  Put ice in a plastic bag.  Place a towel between your skin and the bag.  Leave the ice on for 15-20 minutes, 3-4 times a day, or as directed by your health care provider. Do this while awake, for the first 24 to 48 hours. After that, continue as directed by your caregiver.  Compression helps keep swelling down. It also gives support and helps with discomfort. If an elastic bandage has been applied, it should be removed and reapplied every 3 to 4 hours. It should not be applied tightly, but firmly enough to keep swelling down. Watch fingers or toes for swelling, bluish discoloration, coldness, numbness, or excessive pain. If any of these problems occur, remove the bandage and reapply loosely. Contact your caregiver if these problems continue.  Elevation helps reduce swelling and decreases pain. With extremities, such as the arms, hands, legs, and feet, the injured area should be placed near or above the level of the heart, if possible. SEEK IMMEDIATE MEDICAL CARE IF:  You have persistent pain and swelling.  You develop redness, numbness, or unexpected weakness.  Your symptoms are getting worse rather than improving after several days. These symptoms may indicate that further evaluation or further X-rays are needed. Sometimes, X-rays may not show a small broken bone (fracture) until 1 week or 10 days later. Make a follow-up appointment with your caregiver. Ask when your X-ray results will be ready. Make sure you get your X-ray results. Document Released:  10/14/2000 Document Revised: 07/07/2013 Document Reviewed: 12/01/2010 Exit[employer] Patient Information 2015 [employer]. This information is not intended to replace advice given to you by your health care provider. Make sure you discuss any questions you have with your health care provider.  

## 2014-11-02 LAB — CMP14+EGFR
A/G RATIO: 1.8 (ref 1.1–2.5)
ALT: 19 IU/L (ref 0–44)
AST: 24 IU/L (ref 0–40)
Albumin: 4.4 g/dL (ref 3.5–5.5)
Alkaline Phosphatase: 62 IU/L (ref 39–117)
BUN/Creatinine Ratio: 12 (ref 9–20)
BUN: 11 mg/dL (ref 6–24)
Bilirubin Total: 0.6 mg/dL (ref 0.0–1.2)
CO2: 23 mmol/L (ref 18–29)
CREATININE: 0.9 mg/dL (ref 0.76–1.27)
Calcium: 9.3 mg/dL (ref 8.7–10.2)
Chloride: 99 mmol/L (ref 97–108)
GFR calc Af Amer: 122 mL/min/{1.73_m2} (ref >59)
GFR calc non Af Amer: 106 mL/min/{1.73_m2} (ref >59)
GLOBULIN, TOTAL: 2.4 g/dL (ref 1.5–4.5)
GLUCOSE: 89 mg/dL (ref 65–99)
Potassium: 4.3 mmol/L (ref 3.5–5.2)
Sodium: 139 mmol/L (ref 134–144)
TOTAL PROTEIN: 6.8 g/dL (ref 6.0–8.5)

## 2014-11-02 LAB — NMR, LIPOPROFILE
CHOLESTEROL: 141 mg/dL (ref 100–199)
HDL Cholesterol by NMR: 31 mg/dL — ABNORMAL LOW (ref >39)
HDL PARTICLE NUMBER: 26.4 umol/L — AB (ref >=30.5)
LDL Particle Number: 1248 nmol/L — ABNORMAL HIGH (ref <1000)
LDL Size: 20 nm (ref >20.5)
LDL-C: 86 mg/dL (ref 0–99)
LP-IR Score: 67 — ABNORMAL HIGH (ref <=45)
Small LDL Particle Number: 837 nmol/L — ABNORMAL HIGH (ref <=527)
TRIGLYCERIDES BY NMR: 120 mg/dL (ref 0–149)

## 2014-11-25 ENCOUNTER — Telehealth: Payer: Self-pay | Admitting: Nurse Practitioner

## 2014-11-25 DIAGNOSIS — E785 Hyperlipidemia, unspecified: Secondary | ICD-10-CM

## 2014-11-25 MED ORDER — ROSUVASTATIN CALCIUM 10 MG PO TABS: 10.0000 mg | ORAL_TABLET | Freq: Every day | ORAL | Status: DC

## 2014-11-25 NOTE — Telephone Encounter (Signed)
done

## 2014-12-09 ENCOUNTER — Ambulatory Visit: Payer: Self-pay | Admitting: Medical

## 2015-02-21 ENCOUNTER — Ambulatory Visit (INDEPENDENT_AMBULATORY_CARE_PROVIDER_SITE_OTHER): Payer: BLUE CROSS/BLUE SHIELD | Admitting: Nurse Practitioner

## 2015-02-21 ENCOUNTER — Encounter: Payer: Self-pay | Admitting: Nurse Practitioner

## 2015-02-21 VITALS — BP 110/76 | HR 76 | Temp 97.0°F | Ht 66.0 in | Wt 236.0 lb

## 2015-02-21 DIAGNOSIS — E785 Hyperlipidemia, unspecified: Secondary | ICD-10-CM

## 2015-02-21 DIAGNOSIS — K219 Gastro-esophageal reflux disease without esophagitis: Secondary | ICD-10-CM

## 2015-02-21 DIAGNOSIS — I10 Essential (primary) hypertension: Secondary | ICD-10-CM | POA: Diagnosis not present

## 2015-02-21 MED ORDER — LOSARTAN POTASSIUM-HCTZ 100-25 MG PO TABS: 1.0000 | ORAL_TABLET | Freq: Every day | ORAL | Status: DC

## 2015-02-21 MED ORDER — ROSUVASTATIN CALCIUM 10 MG PO TABS: 10.0000 mg | ORAL_TABLET | Freq: Every day | ORAL | Status: DC

## 2015-02-21 MED ORDER — OMEPRAZOLE 40 MG PO CPDR: 40.0000 mg | DELAYED_RELEASE_CAPSULE | Freq: Every day | ORAL | Status: DC

## 2015-02-21 MED ORDER — AMLODIPINE BESYLATE 10 MG PO TABS: 10.0000 mg | ORAL_TABLET | Freq: Every day | ORAL | Status: DC

## 2015-02-21 NOTE — Patient Instructions (Signed)
Exercise to Stay Healthy Exercise helps you become and stay healthy. EXERCISE IDEAS AND TIPS Choose exercises that:  You enjoy.  Fit into your day. You do not need to exercise really hard to be healthy. You can do exercises at a slow or medium level and stay healthy. You can:  Stretch before and after working out.  Try yoga, Pilates, or tai chi.  Lift weights.  Walk fast, swim, jog, run, climb stairs, bicycle, dance, or rollerskate.  Take aerobic classes. Exercises that burn about 150 calories:  Running 1  miles in 15 minutes.  Playing volleyball for 45 to 60 minutes.  Washing and waxing a car for 45 to 60 minutes.  Playing touch football for 45 minutes.  Walking 1  miles in 35 minutes.  Pushing a stroller 1  miles in 30 minutes.  Playing basketball for 30 minutes.  Raking leaves for 30 minutes.  Bicycling 5 miles in 30 minutes.  Walking 2 miles in 30 minutes.  Dancing for 30 minutes.  Shoveling snow for 15 minutes.  Swimming laps for 20 minutes.  Walking up stairs for 15 minutes.  Bicycling 4 miles in 15 minutes.  Gardening for 30 to 45 minutes.  Jumping rope for 15 minutes.  Washing windows or floors for 45 to 60 minutes. Document Released: 08/04/2010 Document Revised: 09/24/2011 Document Reviewed: 08/04/2010 Exit[employer] Patient Information 2015 [employer]. This information is not intended to replace advice given to you by your health care provider. Make sure you discuss any questions you have with your health care provider.  

## 2015-02-21 NOTE — Progress Notes (Signed)
Subjective:    Patient ID: Jason Harper, male    DOB: 1971-05-06, 44 y.o.   MRN: [id number]  Patient here tdoay for follow up of chronic disease.    Hypertension This is a chronic problem. The current episode started more than 1 year ago. The problem is unchanged. The problem is controlled. Pertinent negatives include no neck pain, palpitations or shortness of breath. Risk factors for coronary artery disease include dyslipidemia, male gender and obesity. Past treatments include calcium channel blockers, angiotensin blockers and diuretics. The current treatment provides moderate improvement. Compliance problems include diet and exercise.   Hyperlipidemia This is a chronic problem. The current episode started more than 1 year ago. The problem is controlled. Recent lipid tests were reviewed and are variable. Pertinent negatives include no shortness of breath. Current antihyperlipidemic treatment includes statins. The current treatment provides moderate improvement of lipids. Compliance problems include adherence to diet and adherence to exercise.  Risk factors for coronary artery disease include dyslipidemia, hypertension, male sex and obesity.  GERD Reports doing well with prilosec, he monitor his diet.   *pt reports left foot pain of 6/10. He reports trying to break a fall and twist his left leg. He reports the pain is worse with ambulation. He has tried motrin without relief.   Review of Systems  Constitutional: Negative.   HENT: Negative.   Respiratory: Negative for shortness of breath.   Cardiovascular: Negative.  Negative for palpitations.  Genitourinary: Negative.   Musculoskeletal: Negative for neck pain.       Left foot pain.   Skin: Negative.   Neurological: Negative.   Hematological: Negative.   Psychiatric/Behavioral: Negative.   All other systems reviewed and are negative.      Objective:   Physical Exam  Constitutional: He is oriented to person, place, and time. He appears  well-developed and well-nourished.  HENT:  Head: Normocephalic.  Right Ear: External ear normal.  Left Ear: External ear normal.  Nose: Nose normal.  Mouth/Throat: Oropharynx is clear and moist.  Eyes: EOM are normal. Pupils are equal, round, and reactive to light.  Neck: Normal range of motion. Neck supple. No JVD present. No thyromegaly present.  Cardiovascular: Normal rate, regular rhythm, normal heart sounds and intact distal pulses.  Exam reveals no gallop and no friction rub.   No murmur heard. Pulmonary/Chest: Effort normal and breath sounds normal. No respiratory distress. He has no wheezes. He has no rales. He exhibits no tenderness.  Abdominal: Soft. Bowel sounds are normal. He exhibits no mass.  Genitourinary: Penis normal.  Musculoskeletal: Normal range of motion. He exhibits edema (left foot traces of edema. ) and tenderness (left foot tenderness with external rotation and plantar flexion. ).  Lymphadenopathy:    He has no cervical adenopathy.  Neurological: He is alert and oriented to person, place, and time. No cranial nerve deficit.  Skin: Skin is warm and dry.  Psychiatric: He has a normal mood and affect. His behavior is normal. Judgment and thought content normal.   BP 110/76 mmHg  Pulse 76  Temp(Src) 97 F (36.1 C) (Oral)  Ht 5' 6"  (1.676 m)  Wt 236 lb (107.049 kg)  BMI 38.11 kg/m2       Assessment & Plan:  1. Essential hypertension Do not add salt to diet - losartan-hydrochlorothiazide (HYZAAR) 100-25 MG per tablet; Take 1 tablet by mouth daily.  Dispense: 30 tablet; Refill: 5 - amLODipine (NORVASC) 10 MG tablet; Take 1 tablet (10 mg total) by mouth daily.  Dispense: 30 tablet; Refill: 5 - CMP14+EGFR  2. Gastroesophageal reflux disease without esophagitis Avoid spicy foods Do not eat 2 hours prior to bedtime - omeprazole (PRILOSEC) 40 MG capsule; Take 1 capsule (40 mg total) by mouth daily.  Dispense: 30 capsule; Refill: 5  3. Hyperlipidemia with  target LDL less than 100 Low fat diet - rosuvastatin (CRESTOR) 10 MG tablet; Take 1 tablet (10 mg total) by mouth daily.  Dispense: 90 tablet; Refill: 1 - Lipid panel      Labs pending Health maintenance reviewed Diet and exercise encouraged Continue all meds Follow up  In 3 months   [place], FNP

## 2015-02-22 ENCOUNTER — Other Ambulatory Visit: Payer: Self-pay | Admitting: Nurse Practitioner

## 2015-02-22 LAB — CMP14+EGFR
A/G RATIO: 1.8 (ref 1.1–2.5)
ALBUMIN: 4.2 g/dL (ref 3.5–5.5)
ALT: 22 IU/L (ref 0–44)
AST: 19 IU/L (ref 0–40)
Alkaline Phosphatase: 61 IU/L (ref 39–117)
BUN/Creatinine Ratio: 14 (ref 9–20)
BUN: 14 mg/dL (ref 6–24)
Bilirubin Total: 0.3 mg/dL (ref 0.0–1.2)
CALCIUM: 9.5 mg/dL (ref 8.7–10.2)
CO2: 23 mmol/L (ref 18–29)
CREATININE: 0.99 mg/dL (ref 0.76–1.27)
Chloride: 100 mmol/L (ref 97–108)
GFR calc Af Amer: 108 mL/min/{1.73_m2} (ref >59)
GFR calc non Af Amer: 94 mL/min/{1.73_m2} (ref >59)
Globulin, Total: 2.4 g/dL (ref 1.5–4.5)
Glucose: 105 mg/dL — ABNORMAL HIGH (ref 65–99)
Potassium: 4.6 mmol/L (ref 3.5–5.2)
Sodium: 138 mmol/L (ref 134–144)
Total Protein: 6.6 g/dL (ref 6.0–8.5)

## 2015-02-22 LAB — LIPID PANEL
CHOLESTEROL TOTAL: 194 mg/dL (ref 100–199)
Chol/HDL Ratio: 8.1 ratio units — ABNORMAL HIGH (ref 0.0–5.0)
HDL: 24 mg/dL — ABNORMAL LOW (ref >39)
LDL Calculated: 125 mg/dL — ABNORMAL HIGH (ref 0–99)
Triglycerides: 225 mg/dL — ABNORMAL HIGH (ref 0–149)
VLDL Cholesterol Cal: 45 mg/dL — ABNORMAL HIGH (ref 5–40)

## 2015-02-22 MED ORDER — FENOFIBRATE 145 MG PO TABS: 145.0000 mg | ORAL_TABLET | Freq: Every day | ORAL | Status: DC

## 2015-02-22 MED ORDER — ROSUVASTATIN CALCIUM 20 MG PO TABS: 20.0000 mg | ORAL_TABLET | Freq: Every day | ORAL | Status: DC

## 2015-03-18 ENCOUNTER — Telehealth: Payer: Self-pay | Admitting: *Deleted

## 2015-03-18 NOTE — Telephone Encounter (Signed)
Unable to reach patient at time of Pre-Visit Call.  Left message for patient to return call when available.    

## 2015-03-22 ENCOUNTER — Encounter: Payer: Self-pay | Admitting: Family Medicine

## 2015-03-22 ENCOUNTER — Telehealth: Payer: Self-pay | Admitting: Nurse Practitioner

## 2015-03-22 ENCOUNTER — Ambulatory Visit (INDEPENDENT_AMBULATORY_CARE_PROVIDER_SITE_OTHER): Payer: BLUE CROSS/BLUE SHIELD | Admitting: Family Medicine

## 2015-03-22 VITALS — BP 120/72 | HR 75 | Temp 98.3°F | Ht 66.0 in | Wt 234.0 lb

## 2015-03-22 DIAGNOSIS — M25551 Pain in right hip: Secondary | ICD-10-CM | POA: Insufficient documentation

## 2015-03-22 DIAGNOSIS — R739 Hyperglycemia, unspecified: Secondary | ICD-10-CM | POA: Diagnosis not present

## 2015-03-22 DIAGNOSIS — R0609 Other forms of dyspnea: Secondary | ICD-10-CM

## 2015-03-22 DIAGNOSIS — M25579 Pain in unspecified ankle and joints of unspecified foot: Secondary | ICD-10-CM | POA: Insufficient documentation

## 2015-03-22 DIAGNOSIS — M25572 Pain in left ankle and joints of left foot: Secondary | ICD-10-CM

## 2015-03-22 DIAGNOSIS — I10 Essential (primary) hypertension: Secondary | ICD-10-CM

## 2015-03-22 DIAGNOSIS — R0600 Dyspnea, unspecified: Secondary | ICD-10-CM | POA: Insufficient documentation

## 2015-03-22 DIAGNOSIS — E785 Hyperlipidemia, unspecified: Secondary | ICD-10-CM

## 2015-03-22 DIAGNOSIS — H9191 Unspecified hearing loss, right ear: Secondary | ICD-10-CM | POA: Diagnosis not present

## 2015-03-22 DIAGNOSIS — E669 Obesity, unspecified: Secondary | ICD-10-CM | POA: Diagnosis not present

## 2015-03-22 DIAGNOSIS — K219 Gastro-esophageal reflux disease without esophagitis: Secondary | ICD-10-CM

## 2015-03-22 HISTORY — DX: Unspecified hearing loss, right ear: H91.91

## 2015-03-22 HISTORY — DX: Obesity, unspecified: E66.9

## 2015-03-22 LAB — COMPREHENSIVE METABOLIC PANEL
ALK PHOS: 49 U/L (ref 39–117)
ALT: 19 U/L (ref 0–53)
AST: 17 U/L (ref 0–37)
Albumin: 4.3 g/dL (ref 3.5–5.2)
BUN: 16 mg/dL (ref 6–23)
CO2: 27 mEq/L (ref 19–32)
Calcium: 9.2 mg/dL (ref 8.4–10.5)
Chloride: 101 mEq/L (ref 96–112)
Creatinine, Ser: 1.29 mg/dL (ref 0.40–1.50)
GFR: 64.87 mL/min (ref >60.00)
GLUCOSE: 106 mg/dL — AB (ref 70–99)
POTASSIUM: 3.7 meq/L (ref 3.5–5.1)
Sodium: 137 mEq/L (ref 135–145)
TOTAL PROTEIN: 7.4 g/dL (ref 6.0–8.3)
Total Bilirubin: 0.6 mg/dL (ref 0.2–1.2)

## 2015-03-22 LAB — HEMOGLOBIN A1C: HEMOGLOBIN A1C: 5.9 % (ref 4.6–6.5)

## 2015-03-22 MED ORDER — ROSUVASTATIN CALCIUM 20 MG PO TABS: 20.0000 mg | ORAL_TABLET | Freq: Every day | ORAL | Status: DC

## 2015-03-22 NOTE — Telephone Encounter (Signed)
Advise on request. 

## 2015-03-22 NOTE — Telephone Encounter (Signed)
Refill done.  

## 2015-03-22 NOTE — Patient Instructions (Signed)
Ice 15 minutes twice daily then apply Aspercreme or Salon Pas gel   Preventive Care for Adults A healthy lifestyle and preventive care can promote health and wellness. Preventive health guidelines for men include the following key practices:  A routine yearly physical is a good way to check with your health care provider about your health and preventative screening. It is a chance to share any concerns and updates on your health and to receive a thorough exam.  Visit your dentist for a routine exam and preventative care every 6 months. Brush your teeth twice a day and floss once a day. Good oral hygiene prevents tooth decay and gum disease.  The frequency of eye exams is based on your age, health, family medical history, use of contact lenses, and other factors. Follow your health care provider's recommendations for frequency of eye exams.  Eat a healthy diet. Foods such as vegetables, fruits, whole grains, low-fat dairy products, and lean protein foods contain the nutrients you need without too many calories. Decrease your intake of foods high in solid fats, added sugars, and salt. Eat the right amount of calories for you.Get information about a proper diet from your health care provider, if necessary.  Regular physical exercise is one of the most important things you can do for your health. Most adults should get at least 150 minutes of moderate-intensity exercise (any activity that increases your heart rate and causes you to sweat) each week. In addition, most adults need muscle-strengthening exercises on 2 or more days a week.  Maintain a healthy weight. The body mass index (BMI) is a screening tool to identify possible weight problems. It provides an estimate of body fat based on height and weight. Your health care provider can find your BMI and can help you achieve or maintain a healthy weight.For adults 20 years and older:  A BMI below 18.5 is considered underweight.  A BMI of 18.5 to  24.9 is normal.  A BMI of 25 to 29.9 is considered overweight.  A BMI of 30 and above is considered obese.  Maintain normal blood lipids and cholesterol levels by exercising and minimizing your intake of saturated fat. Eat a balanced diet with plenty of fruit and vegetables. Blood tests for lipids and cholesterol should begin at age 49 and be repeated every 5 years. If your lipid or cholesterol levels are high, you are over 50, or you are at high risk for heart disease, you may need your cholesterol levels checked more frequently.Ongoing high lipid and cholesterol levels should be treated with medicines if diet and exercise are not working.  If you smoke, find out from your health care provider how to quit. If you do not use tobacco, do not start.  Lung cancer screening is recommended for adults aged 54-80 years who are at high risk for developing lung cancer because of a history of smoking. A yearly low-dose CT scan of the lungs is recommended for people who have at least a 30-pack-year history of smoking and are a current smoker or have quit within the past 15 years. A pack year of smoking is smoking an average of 1 pack of cigarettes a day for 1 year (for example: 1 pack a day for 30 years or 2 packs a day for 15 years). Yearly screening should continue until the smoker has stopped smoking for at least 15 years. Yearly screening should be stopped for people who develop a health problem that would prevent them from having lung  cancer treatment.  If you choose to drink alcohol, do not have more than 2 drinks per day. One drink is considered to be 12 ounces (355 mL) of beer, 5 ounces (148 mL) of wine, or 1.5 ounces (44 mL) of liquor.  Avoid use of street drugs. Do not share needles with anyone. Ask for help if you need support or instructions about stopping the use of drugs.  High blood pressure causes heart disease and increases the risk of stroke. Your blood pressure should be checked at least  every 1-2 years. Ongoing high blood pressure should be treated with medicines, if weight loss and exercise are not effective.  If you are 25-54 years old, ask your health care provider if you should take aspirin to prevent heart disease.  Diabetes screening involves taking a blood sample to check your fasting blood sugar level. This should be done once every 3 years, after age 9, if you are within normal weight and without risk factors for diabetes. Testing should be considered at a younger age or be carried out more frequently if you are overweight and have at least 1 risk factor for diabetes.  Colorectal cancer can be detected and often prevented. Most routine colorectal cancer screening begins at the age of 55 and continues through age 52. However, your health care provider may recommend screening at an earlier age if you have risk factors for colon cancer. On a yearly basis, your health care provider may provide home test kits to check for hidden blood in the stool. Use of a small camera at the end of a tube to directly examine the colon (sigmoidoscopy or colonoscopy) can detect the earliest forms of colorectal cancer. Talk to your health care provider about this at age 71, when routine screening begins. Direct exam of the colon should be repeated every 5-10 years through age 6, unless early forms of precancerous polyps or small growths are found.  People who are at an increased risk for hepatitis B should be screened for this virus. You are considered at high risk for hepatitis B if:  You were born in a country where hepatitis B occurs often. Talk with your health care provider about which countries are considered high risk.  Your parents were born in a high-risk country and you have not received a shot to protect against hepatitis B (hepatitis B vaccine).  You have HIV or AIDS.  You use needles to inject street drugs.  You live with, or have sex with, someone who has hepatitis B.  You are  a man who has sex with other men (MSM).  You get hemodialysis treatment.  You take certain medicines for conditions such as cancer, organ transplantation, and autoimmune conditions.  Hepatitis C blood testing is recommended for all people born from [birth year] through 1965 and any individual with known risks for hepatitis C.  Practice safe sex. Use condoms and avoid high-risk sexual practices to reduce the spread of sexually transmitted infections (STIs). STIs include gonorrhea, chlamydia, syphilis, trichomonas, herpes, HPV, and human immunodeficiency virus (HIV). Herpes, HIV, and HPV are viral illnesses that have no cure. They can result in disability, cancer, and death.  If you are at risk of being infected with HIV, it is recommended that you take a prescription medicine daily to prevent HIV infection. This is called preexposure prophylaxis (PrEP). You are considered at risk if:  You are a man who has sex with other men (MSM) and have other risk factors.  You are  a heterosexual man, are sexually active, and are at increased risk for HIV infection.  You take drugs by injection.  You are sexually active with a partner who has HIV.  Talk with your health care provider about whether you are at high risk of being infected with HIV. If you choose to begin PrEP, you should first be tested for HIV. You should then be tested every 3 months for as long as you are taking PrEP.  A one-time screening for abdominal aortic aneurysm (AAA) and surgical repair of large AAAs by ultrasound are recommended for men ages 82 to 81 years who are current or former smokers.  Healthy men should no longer receive prostate-specific antigen (PSA) blood tests as part of routine cancer screening. Talk with your health care provider about prostate cancer screening.  Testicular cancer screening is not recommended for adult males who have no symptoms. Screening includes self-exam, a health care provider exam, and other screening  tests. Consult with your health care provider about any symptoms you have or any concerns you have about testicular cancer.  Use sunscreen. Apply sunscreen liberally and repeatedly throughout the day. You should seek shade when your shadow is shorter than you. Protect yourself by wearing long sleeves, pants, a wide-brimmed hat, and sunglasses year round, whenever you are outdoors.  Once a month, do a whole-body skin exam, using a mirror to look at the skin on your back. Tell your health care provider about new moles, moles that have irregular borders, moles that are larger than a pencil eraser, or moles that have changed in shape or color.  Stay current with required vaccines (immunizations).  Influenza vaccine. All adults should be immunized every year.  Tetanus, diphtheria, and acellular pertussis (Td, Tdap) vaccine. An adult who has not previously received Tdap or who does not know his vaccine status should receive 1 dose of Tdap. This initial dose should be followed by tetanus and diphtheria toxoids (Td) booster doses every 10 years. Adults with an unknown or incomplete history of completing a 3-dose immunization series with Td-containing vaccines should begin or complete a primary immunization series including a Tdap dose. Adults should receive a Td booster every 10 years.  Varicella vaccine. An adult without evidence of immunity to varicella should receive 2 doses or a second dose if he has previously received 1 dose.  Human papillomavirus (HPV) vaccine. Males aged 60-21 years who have not received the vaccine previously should receive the 3-dose series. Males aged 22-26 years may be immunized. Immunization is recommended through the age of 22 years for any male who has sex with males and did not get any or all doses earlier. Immunization is recommended for any person with an immunocompromised condition through the age of 14 years if he did not get any or all doses earlier. During the 3-dose  series, the second dose should be obtained 4-8 weeks after the first dose. The third dose should be obtained 24 weeks after the first dose and 16 weeks after the second dose.  Zoster vaccine. One dose is recommended for adults aged 9 years or older unless certain conditions are present.  Measles, mumps, and rubella (MMR) vaccine. Adults born before [birth year] generally are considered immune to measles and mumps. Adults born in [birth year] or later should have 1 or more doses of MMR vaccine unless there is a contraindication to the vaccine or there is laboratory evidence of immunity to each of the three diseases. A routine second dose of MMR vaccine  should be obtained at least 28 days after the first dose for students attending postsecondary schools, health care workers, or international travelers. People who received inactivated measles vaccine or an unknown type of measles vaccine during 1963-1967 should receive 2 doses of MMR vaccine. People who received inactivated mumps vaccine or an unknown type of mumps vaccine before 1979 and are at high risk for mumps infection should consider immunization with 2 doses of MMR vaccine. Unvaccinated health care workers born before [birth year] who lack laboratory evidence of measles, mumps, or rubella immunity or laboratory confirmation of disease should consider measles and mumps immunization with 2 doses of MMR vaccine or rubella immunization with 1 dose of MMR vaccine.  Pneumococcal 13-valent conjugate (PCV13) vaccine. When indicated, a person who is uncertain of his immunization history and has no record of immunization should receive the PCV13 vaccine. An adult aged 35 years or older who has certain medical conditions and has not been previously immunized should receive 1 dose of PCV13 vaccine. This PCV13 should be followed with a dose of pneumococcal polysaccharide (PPSV23) vaccine. The PPSV23 vaccine dose should be obtained at least 8 weeks after the dose of PCV13 vaccine. An adult  aged 36 years or older who has certain medical conditions and previously received 1 or more doses of PPSV23 vaccine should receive 1 dose of PCV13. The PCV13 vaccine dose should be obtained 1 or more years after the last PPSV23 vaccine dose.  Pneumococcal polysaccharide (PPSV23) vaccine. When PCV13 is also indicated, PCV13 should be obtained first. All adults aged 60 years and older should be immunized. An adult younger than age 66 years who has certain medical conditions should be immunized. Any person who resides in a nursing home or long-term care facility should be immunized. An adult smoker should be immunized. People with an immunocompromised condition and certain other conditions should receive both PCV13 and PPSV23 vaccines. People with human immunodeficiency virus (HIV) infection should be immunized as soon as possible after diagnosis. Immunization during chemotherapy or radiation therapy should be avoided. Routine use of PPSV23 vaccine is not recommended for American Indians, [place]atives, or people younger than 65 years unless there are medical conditions that require PPSV23 vaccine. When indicated, people who have unknown immunization and have no record of immunization should receive PPSV23 vaccine. One-time revaccination 5 years after the first dose of PPSV23 is recommended for people aged 19-64 years who have chronic kidney failure, nephrotic syndrome, asplenia, or immunocompromised conditions. People who received 1-2 doses of PPSV23 before age 3 years should receive another dose of PPSV23 vaccine at age 50 years or later if at least 5 years have passed since the previous dose. Doses of PPSV23 are not needed for people immunized with PPSV23 at or after age 55 years.  Meningococcal vaccine. Adults with asplenia or persistent complement component deficiencies should receive 2 doses of quadrivalent meningococcal conjugate (MenACWY-D) vaccine. The doses should be obtained at least 2 months apart.  Microbiologists working with certain meningococcal bacteria, [employer] recruits, people at risk during an outbreak, and people who travel to or live in countries with a high rate of meningitis should be immunized. A first-year college student up through age 64 years who is living in a residence hall should receive a dose if he did not receive a dose on or after his 16th birthday. Adults who have certain high-risk conditions should receive one or more doses of vaccine.  Hepatitis A vaccine. Adults who wish to be protected from this disease, have  certain high-risk conditions, work with hepatitis A-infected animals, work in hepatitis A research labs, or travel to or work in countries with a high rate of hepatitis A should be immunized. Adults who were previously unvaccinated and who anticipate close contact with an international adoptee during the first 60 days after arrival in the [place] from a country with a high rate of hepatitis A should be immunized.  Hepatitis B vaccine. Adults should be immunized if they wish to be protected from this disease, have certain high-risk conditions, may be exposed to blood or other infectious body fluids, are household contacts or sex partners of hepatitis B positive people, are clients or workers in certain care facilities, or travel to or work in countries with a high rate of hepatitis B.  Haemophilus influenzae type b (Hib) vaccine. A previously unvaccinated person with asplenia or sickle cell disease or having a scheduled splenectomy should receive 1 dose of Hib vaccine. Regardless of previous immunization, a recipient of a hematopoietic stem cell transplant should receive a 3-dose series 6-12 months after his successful transplant. Hib vaccine is not recommended for adults with HIV infection. Preventive Service / Frequency Ages 51 to 45  Blood pressure check.** / Every 1 to 2 years.  Lipid and cholesterol check.** / Every 5 years beginning at age  50.  Hepatitis C blood test.** / For any individual with known risks for hepatitis C.  Skin self-exam. / Monthly.  Influenza vaccine. / Every year.  Tetanus, diphtheria, and acellular pertussis (Tdap, Td) vaccine.** / Consult your health care provider. 1 dose of Td every 10 years.  Varicella vaccine.** / Consult your health care provider.  HPV vaccine. / 3 doses over 6 months, if 78 or younger.  Measles, mumps, rubella (MMR) vaccine.** / You need at least 1 dose of MMR if you were born in 1957 or later. You may also need a second dose.  Pneumococcal 13-valent conjugate (PCV13) vaccine.** / Consult your health care provider.  Pneumococcal polysaccharide (PPSV23) vaccine.** / 1 to 2 doses if you smoke cigarettes or if you have certain conditions.  Meningococcal vaccine.** / 1 dose if you are age 91 to 29 years and a Market researcher living in a residence hall, or have one of several medical conditions. You may also need additional booster doses.  Hepatitis A vaccine.** / Consult your health care provider.  Hepatitis B vaccine.** / Consult your health care provider.  Haemophilus influenzae type b (Hib) vaccine.** / Consult your health care provider. Ages 3 to 59  Blood pressure check.** / Every 1 to 2 years.  Lipid and cholesterol check.** / Every 5 years beginning at age 56.  Lung cancer screening. / Every year if you are aged 30-80 years and have a 30-pack-year history of smoking and currently smoke or have quit within the past 15 years. Yearly screening is stopped once you have quit smoking for at least 15 years or develop a health problem that would prevent you from having lung cancer treatment.  Fecal occult blood test (FOBT) of stool. / Every year beginning at age 56 and continuing until age 68. You may not have to do this test if you get a colonoscopy every 10 years.  Flexible sigmoidoscopy** or colonoscopy.** / Every 5 years for a flexible sigmoidoscopy or every  10 years for a colonoscopy beginning at age 10 and continuing until age 60.  Hepatitis C blood test.** / For all people born from [birth year] through 1963 and any individual with  known risks for hepatitis C.  Skin self-exam. / Monthly.  Influenza vaccine. / Every year.  Tetanus, diphtheria, and acellular pertussis (Tdap/Td) vaccine.** / Consult your health care provider. 1 dose of Td every 10 years.  Varicella vaccine.** / Consult your health care provider.  Zoster vaccine.** / 1 dose for adults aged 57 years or older.  Measles, mumps, rubella (MMR) vaccine.** / You need at least 1 dose of MMR if you were born in 1955 or later. You may also need a second dose.  Pneumococcal 13-valent conjugate (PCV13) vaccine.** / Consult your health care provider.  Pneumococcal polysaccharide (PPSV23) vaccine.** / 1 to 2 doses if you smoke cigarettes or if you have certain conditions.  Meningococcal vaccine.** / Consult your health care provider.  Hepatitis A vaccine.** / Consult your health care provider.  Hepatitis B vaccine.** / Consult your health care provider.  Haemophilus influenzae type b (Hib) vaccine.** / Consult your health care provider. Ages 58 and over  Blood pressure check.** / Every 1 to 2 years.  Lipid and cholesterol check.**/ Every 5 years beginning at age 24.  Lung cancer screening. / Every year if you are aged 65-80 years and have a 30-pack-year history of smoking and currently smoke or have quit within the past 15 years. Yearly screening is stopped once you have quit smoking for at least 15 years or develop a health problem that would prevent you from having lung cancer treatment.  Fecal occult blood test (FOBT) of stool. / Every year beginning at age 53 and continuing until age 44. You may not have to do this test if you get a colonoscopy every 10 years.  Flexible sigmoidoscopy** or colonoscopy.** / Every 5 years for a flexible sigmoidoscopy or every 10 years for a colonoscopy  beginning at age 81 and continuing until age 70.  Hepatitis C blood test.** / For all people born from [birth year] through 1963 and any individual with known risks for hepatitis C.  Abdominal aortic aneurysm (AAA) screening.** / A one-time screening for ages 44 to 40 years who are current or former smokers.  Skin self-exam. / Monthly.  Influenza vaccine. / Every year.  Tetanus, diphtheria, and acellular pertussis (Tdap/Td) vaccine.** / 1 dose of Td every 10 years.  Varicella vaccine.** / Consult your health care provider.  Zoster vaccine.** / 1 dose for adults aged 82 years or older.  Pneumococcal 13-valent conjugate (PCV13) vaccine.** / Consult your health care provider.  Pneumococcal polysaccharide (PPSV23) vaccine.** / 1 dose for all adults aged 40 years and older.  Meningococcal vaccine.** / Consult your health care provider.  Hepatitis A vaccine.** / Consult your health care provider.  Hepatitis B vaccine.** / Consult your health care provider.  Haemophilus influenzae type b (Hib) vaccine.** / Consult your health care provider. **Family history and personal history of risk and conditions may change your health care provider's recommendations. Document Released: 08/28/2001 Document Revised: 07/07/2013 Document Reviewed: 11/27/2010 [facility] Patient Information 2015 [place], [place]. This information is not intended to replace advice given to you by your health care provider. Make sure you discuss any questions you have with your health care provider.

## 2015-03-22 NOTE — Assessment & Plan Note (Signed)
Well controlled, no changes to meds. Encouraged heart healthy diet such as the DASH diet and exercise as tolerated.  °

## 2015-03-22 NOTE — Assessment & Plan Note (Signed)
With elevated Triglycerides as well. Check hgba1c today

## 2015-03-22 NOTE — Progress Notes (Signed)
Subjective:    Patient ID: Jason Harper, male    DOB: 1971-05-07, 44 y.o.   MRN: [id number]  Chief Complaint  Patient presents with  . Establish Care    HPI Patient is in today for new patient appt. He is here with numerous concerns. He has a PMH of HTN, hyperlipidemia, obesity and heartburn. Feels well today but c/o 4 months of DOE, denies any acute injury when this started. No associated symptoms such as palpitations, chest pain etc. Also complains of 6 months of left ankle pain s/p a fall with inversion injury. Hurts daily and swells, worse with use. Over medial aspect. Denies CP/palp/SOB/HA/congestion/fevers/GI or GU c/o. Taking meds as prescribed  Past Medical History  Diagnosis Date  . Hypertension   . High cholesterol   . Obesity 03/22/2015  . Hearing loss in right ear 03/22/2015    Secondary to Scarlet Fever as child    History reviewed. No pertinent past surgical history.  Family History  Problem Relation Age of Onset  . Hyperlipidemia Mother   . Hypertension Mother   . Arthritis Mother   . Hypertension Father   . Hyperlipidemia Father   . Heart disease Father     MI  . Hypertension Brother   . Heart disease Brother     MI    Social History   Social History  . Marital Status: Legally Separated    Spouse Name: N/A  . Number of Children: N/A  . Years of Education: N/A   Occupational History  . Not on file.   Social History Main Topics  . Smoking status: Never Smoker   . Smokeless tobacco: Never Used  . Alcohol Use: No  . Drug Use: No  . Sexual Activity: Not on file     Comment: lives alon, no dietary restrictions, works Teacher, English as a foreign language job   Other Topics Concern  . Not on file   Social History Narrative    Outpatient Prescriptions Prior to Visit  Medication Sig Dispense Refill  . amLODipine (NORVASC) 10 MG tablet Take 1 tablet (10 mg total) by mouth daily. 30 tablet 5  . aspirin EC 81 MG tablet Take 81 mg by mouth daily.    . fenofibrate  (TRICOR) 145 MG tablet Take 1 tablet (145 mg total) by mouth daily. 30 tablet 5  . ibuprofen (ADVIL,MOTRIN) 800 MG tablet Take 1 tablet (800 mg total) by mouth every 8 (eight) hours as needed for pain. 40 tablet 1  . losartan-hydrochlorothiazide (HYZAAR) 100-25 MG per tablet Take 1 tablet by mouth daily. 30 tablet 5  . omeprazole (PRILOSEC) 40 MG capsule Take 1 capsule (40 mg total) by mouth daily. 30 capsule 5  . rosuvastatin (CRESTOR) 20 MG tablet Take 1 tablet (20 mg total) by mouth daily. 30 tablet 3   No facility-administered medications prior to visit.    No Known Allergies  Review of Systems  Constitutional: Negative for fever and malaise/fatigue.  HENT: Negative for congestion.   Eyes: Negative for discharge.  Respiratory: Positive for shortness of breath.   Cardiovascular: Negative for chest pain, palpitations and leg swelling.  Gastrointestinal: Positive for heartburn. Negative for nausea and abdominal pain.  Genitourinary: Negative for dysuria.  Musculoskeletal: Positive for joint pain. Negative for falls.  Skin: Negative for rash.  Neurological: Negative for loss of consciousness and headaches.  Endo/Heme/Allergies: Negative for environmental allergies.  Psychiatric/Behavioral: Negative for depression. The patient is not nervous/anxious.        Objective:  Physical Exam  Constitutional: He is oriented to person, place, and time. He appears well-developed and well-nourished. No distress.  HENT:  Head: Normocephalic and atraumatic.  Eyes: Conjunctivae are normal.  Neck: Neck supple. No thyromegaly present.  Cardiovascular: Normal rate, regular rhythm and normal heart sounds.   No murmur heard. Pulmonary/Chest: Effort normal and breath sounds normal. No respiratory distress. He has no wheezes.  Abdominal: Soft. Bowel sounds are normal. He exhibits no mass. There is no tenderness.  Musculoskeletal: He exhibits no edema.  Lymphadenopathy:    He has no cervical  adenopathy.  Neurological: He is alert and oriented to person, place, and time.  Skin: Skin is warm and dry.  Psychiatric: He has a normal mood and affect. His behavior is normal.    BP 120/72 mmHg  Pulse 75  Temp(Src) 98.3 F (36.8 C) (Oral)  Ht 5' 6"  (1.676 m)  Wt 234 lb (106.142 kg)  BMI 37.79 kg/m2  SpO2 97% Wt Readings from Last 3 Encounters:  03/22/15 234 lb (106.142 kg)  02/21/15 236 lb (107.049 kg)  11/01/14 226 lb (102.513 kg)     Lab Results  Component Value Date   GLUCOSE 105* 02/21/2015   CHOL 194 02/21/2015   TRIG 225* 02/21/2015   HDL 24* 02/21/2015   LDLCALC 125* 02/21/2015   ALT 22 02/21/2015   AST 19 02/21/2015   NA 138 02/21/2015   K 4.6 02/21/2015   CL 100 02/21/2015   CREATININE 0.99 02/21/2015   BUN 14 02/21/2015   CO2 23 02/21/2015   TSH 1.286 12/16/2012   PSA 0.9 07/26/2014   HGBA1C 5.4% 12/16/2012    Lab Results  Component Value Date   TSH 1.286 12/16/2012   No results found for: WBC, HGB, HCT, MCV, PLT Lab Results  Component Value Date   NA 138 02/21/2015   K 4.6 02/21/2015   CO2 23 02/21/2015   GLUCOSE 105* 02/21/2015   BUN 14 02/21/2015   CREATININE 0.99 02/21/2015   BILITOT 0.3 02/21/2015   ALKPHOS 61 02/21/2015   AST 19 02/21/2015   ALT 22 02/21/2015   PROT 6.6 02/21/2015   ALBUMIN 4.2 12/16/2012   CALCIUM 9.5 02/21/2015   Lab Results  Component Value Date   CHOL 194 02/21/2015   Lab Results  Component Value Date   HDL 24* 02/21/2015   Lab Results  Component Value Date   LDLCALC 125* 02/21/2015   Lab Results  Component Value Date   TRIG 225* 02/21/2015   Lab Results  Component Value Date   CHOLHDL 8.1* 02/21/2015   Lab Results  Component Value Date   HGBA1C 5.4% 12/16/2012       Assessment & Plan:   Problem List Items Addressed This Visit    Pain in joint, ankle and foot    S/p inversion injury 6 months ago. Pain persists along medial aspect surrounding medial malleolus. Encouraged elevation, ice  and aspercreme. May continue Ibuprofen prn and referred to podiatry for consideration      Relevant Orders   Ambulatory referral to Podiatry   Obesity - Primary    Encouraged DASH diet, decrease po intake and increase exercise as tolerated. Needs 7-8 hours of sleep nightly. Avoid trans fats, eat small, frequent meals every 4-5 hours with lean proteins, complex carbs and healthy fats. Minimize simple carbs, GMO foods.      Hypertension    Well controlled, no changes to meds. Encouraged heart healthy diet such as the DASH diet and exercise  as tolerated.       Hyperlipidemia with target LDL less than 100    Recently started on Fenofibrate but had excessive sweating, will proceed with increased dose of Crestor 20 mg daily and reassess cholesterol in 2 months before proceeding with Fenofibrate trial again. Encouraged heart healthy diet, increase exercise, avoid trans fats, consider a krill oil cap daily      Hyperglycemia    With elevated Triglycerides as well. Check hgba1c today      Relevant Orders   Comp Met (CMET)   Hemoglobin A1c   Hearing loss in right ear   GERD (gastroesophageal reflux disease)    Avoid offending foods, start probiotics. Do not eat large meals in late evening and consider raising head of bed.       Dyspnea on exertion    Onset roughly 4 months ago without associated symptoms but strong family history of heart disease and personal history of scarlet fever as child. Proceed with EKG and Echo      Relevant Orders   Echocardiogram   EKG 12-Lead (Completed)      I am having Mr. Nistler maintain his aspirin EC, ibuprofen, losartan-hydrochlorothiazide, amLODipine, omeprazole, rosuvastatin, and fenofibrate.  No orders of the defined types were placed in this encounter.     Penni Homans, MD

## 2015-03-22 NOTE — Assessment & Plan Note (Signed)
Onset roughly 4 months ago without associated symptoms but strong family history of heart disease and personal history of scarlet fever as child. Proceed with EKG and Echo

## 2015-03-22 NOTE — Assessment & Plan Note (Signed)
Recently started on Fenofibrate but had excessive sweating, will proceed with increased dose of Crestor 20 mg daily and reassess cholesterol in 2 months before proceeding with Fenofibrate trial again. Encouraged heart healthy diet, increase exercise, avoid trans fats, consider a krill oil cap daily

## 2015-03-22 NOTE — Telephone Encounter (Signed)
OK to send in rx for Crestor 20 mg tabs, 1 tab po qhs, disp #30 with 5 rf or #90 with 1 rf at patient discretion

## 2015-03-22 NOTE — Progress Notes (Signed)
Pre visit review using our clinic review tool, if applicable. No additional management support is needed unless otherwise documented below in the visit note. 

## 2015-03-22 NOTE — Assessment & Plan Note (Signed)
Encouraged DASH diet, decrease po intake and increase exercise as tolerated. Needs 7-8 hours of sleep nightly. Avoid trans fats, eat small, frequent meals every 4-5 hours with lean proteins, complex carbs and healthy fats. Minimize simple carbs, GMO foods. 

## 2015-03-22 NOTE — Assessment & Plan Note (Signed)
Avoid offending foods, start probiotics. Do not eat large meals in late evening and consider raising head of bed.  

## 2015-03-22 NOTE — Telephone Encounter (Signed)
Caller name:Plumer Relationship to patient:self Can be [phone] Pharmacy:[facility]  Reason for call:crestor 10 mg  Dr b said to increase to 2 a day  He needs rx called in for that amount

## 2015-03-22 NOTE — Assessment & Plan Note (Signed)
S/p inversion injury 6 months ago. Pain persists along medial aspect surrounding medial malleolus. Encouraged elevation, ice and aspercreme. May continue Ibuprofen prn and referred to podiatry for consideration

## 2015-03-25 ENCOUNTER — Telehealth (HOSPITAL_COMMUNITY): Payer: Self-pay | Admitting: *Deleted

## 2015-04-05 ENCOUNTER — Ambulatory Visit (HOSPITAL_COMMUNITY): Payer: BLUE CROSS/BLUE SHIELD | Attending: Cardiovascular Disease

## 2015-04-05 ENCOUNTER — Other Ambulatory Visit: Payer: Self-pay

## 2015-04-05 DIAGNOSIS — R0609 Other forms of dyspnea: Secondary | ICD-10-CM | POA: Diagnosis not present

## 2015-04-05 DIAGNOSIS — R0600 Dyspnea, unspecified: Secondary | ICD-10-CM | POA: Insufficient documentation

## 2015-04-05 DIAGNOSIS — I10 Essential (primary) hypertension: Secondary | ICD-10-CM | POA: Diagnosis not present

## 2015-04-05 DIAGNOSIS — Z8249 Family history of ischemic heart disease and other diseases of the circulatory system: Secondary | ICD-10-CM | POA: Diagnosis not present

## 2015-04-05 DIAGNOSIS — E785 Hyperlipidemia, unspecified: Secondary | ICD-10-CM | POA: Diagnosis not present

## 2015-04-06 ENCOUNTER — Other Ambulatory Visit (HOSPITAL_COMMUNITY): Payer: BLUE CROSS/BLUE SHIELD

## 2015-04-14 ENCOUNTER — Encounter: Payer: Self-pay | Admitting: Podiatry

## 2015-04-14 ENCOUNTER — Ambulatory Visit (INDEPENDENT_AMBULATORY_CARE_PROVIDER_SITE_OTHER): Payer: BLUE CROSS/BLUE SHIELD

## 2015-04-14 ENCOUNTER — Ambulatory Visit (INDEPENDENT_AMBULATORY_CARE_PROVIDER_SITE_OTHER): Payer: BLUE CROSS/BLUE SHIELD | Admitting: Podiatry

## 2015-04-14 VITALS — BP 121/67 | HR 72 | Resp 12

## 2015-04-14 DIAGNOSIS — M722 Plantar fascial fibromatosis: Secondary | ICD-10-CM

## 2015-04-14 DIAGNOSIS — R52 Pain, unspecified: Secondary | ICD-10-CM

## 2015-04-14 DIAGNOSIS — M76822 Posterior tibial tendinitis, left leg: Secondary | ICD-10-CM | POA: Diagnosis not present

## 2015-04-14 MED ORDER — MELOXICAM 15 MG PO TABS: 15.0000 mg | ORAL_TABLET | Freq: Every day | ORAL | Status: DC

## 2015-04-14 MED ORDER — METHYLPREDNISOLONE 4 MG PO TBPK: ORAL_TABLET | ORAL | Status: DC

## 2015-04-14 NOTE — Patient Instructions (Signed)
Plantar Fasciitis (Heel Spur Syndrome) with Rehab The plantar fascia is a fibrous, ligament-like, soft-tissue structure that spans the bottom of the foot. Plantar fasciitis is a condition that causes pain in the foot due to inflammation of the tissue. SYMPTOMS   Pain and tenderness on the underneath side of the foot.  Pain that worsens with standing or walking. CAUSES  Plantar fasciitis is caused by irritation and injury to the plantar fascia on the underneath side of the foot. Common mechanisms of injury include:  Direct trauma to bottom of the foot.  Damage to a small nerve that runs under the foot where the main fascia attaches to the heel bone.  Stress placed on the plantar fascia due to bone spurs. RISK INCREASES WITH:   Activities that place stress on the plantar fascia (running, jumping, pivoting, or cutting).  Poor strength and flexibility.  Improperly fitted shoes.  Tight calf muscles.  Flat feet.  Failure to warm-up properly before activity.  Obesity. PREVENTION  Warm up and stretch properly before activity.  Allow for adequate recovery between workouts.  Maintain physical fitness:  Strength, flexibility, and endurance.  Cardiovascular fitness.  Maintain a health body weight.  Avoid stress on the plantar fascia.  Wear properly fitted shoes, including arch supports for individuals who have flat feet. PROGNOSIS  If treated properly, then the symptoms of plantar fasciitis usually resolve without surgery. However, occasionally surgery is necessary. RELATED COMPLICATIONS   Recurrent symptoms that may result in a chronic condition.  Problems of the lower back that are caused by compensating for the injury, such as limping.  Pain or weakness of the foot during push-off following surgery.  Chronic inflammation, scarring, and partial or complete fascia tear, occurring more often from repeated injections. TREATMENT  Treatment initially involves the use of  ice and medication to help reduce pain and inflammation. The use of strengthening and stretching exercises may help reduce pain with activity, especially stretches of the Achilles tendon. These exercises may be performed at home or with a therapist. Your caregiver may recommend that you use heel cups of arch supports to help reduce stress on the plantar fascia. Occasionally, corticosteroid injections are given to reduce inflammation. If symptoms persist for greater than 6 months despite non-surgical (conservative), then surgery may be recommended.  MEDICATION   If pain medication is necessary, then nonsteroidal anti-inflammatory medications, such as aspirin and ibuprofen, or other minor pain relievers, such as acetaminophen, are often recommended.  Do not take pain medication within 7 days before surgery.  Prescription pain relievers may be given if deemed necessary by your caregiver. Use only as directed and only as much as you need.  Corticosteroid injections may be given by your caregiver. These injections should be reserved for the most serious cases, because they may only be given a certain number of times. HEAT AND COLD  Cold treatment (icing) relieves pain and reduces inflammation. Cold treatment should be applied for 10 to 15 minutes every 2 to 3 hours for inflammation and pain and immediately after any activity that aggravates your symptoms. Use ice packs or massage the area with a piece of ice (ice massage).  Heat treatment may be used prior to performing the stretching and strengthening activities prescribed by your caregiver, physical therapist, or athletic trainer. Use a heat pack or soak the injury in warm water. SEEK IMMEDIATE MEDICAL CARE IF:  Treatment seems to offer no benefit, or the condition worsens.  Any medications produce adverse side effects. EXERCISES RANGE   OF MOTION (ROM) AND STRETCHING EXERCISES - Plantar Fasciitis (Heel Spur Syndrome) These exercises may help you  when beginning to rehabilitate your injury. Your symptoms may resolve with or without further involvement from your physician, physical therapist or athletic trainer. While completing these exercises, remember:   Restoring tissue flexibility helps normal motion to return to the joints. This allows healthier, less painful movement and activity.  An effective stretch should be held for at least 30 seconds.  A stretch should never be painful. You should only feel a gentle lengthening or release in the stretched tissue. RANGE OF MOTION - Toe Extension, Flexion  Sit with your right / left leg crossed over your opposite knee.  Grasp your toes and gently pull them back toward the top of your foot. You should feel a stretch on the bottom of your toes and/or foot.  Hold this stretch for __________ seconds.  Now, gently pull your toes toward the bottom of your foot. You should feel a stretch on the top of your toes and or foot.  Hold this stretch for __________ seconds. Repeat __________ times. Complete this stretch __________ times per day.  RANGE OF MOTION - Ankle Dorsiflexion, Active Assisted  Remove shoes and sit on a chair that is preferably not on a carpeted surface.  Place right / left foot under knee. Extend your opposite leg for support.  Keeping your heel down, slide your right / left foot back toward the chair until you feel a stretch at your ankle or calf. If you do not feel a stretch, slide your bottom forward to the edge of the chair, while still keeping your heel down.  Hold this stretch for __________ seconds. Repeat __________ times. Complete this stretch __________ times per day.  STRETCH - Gastroc, Standing  Place hands on wall.  Extend right / left leg, keeping the front knee somewhat bent.  Slightly point your toes inward on your back foot.  Keeping your right / left heel on the floor and your knee straight, shift your weight toward the wall, not allowing your back to  arch.  You should feel a gentle stretch in the right / left calf. Hold this position for __________ seconds. Repeat __________ times. Complete this stretch __________ times per day. STRETCH - Soleus, Standing  Place hands on wall.  Extend right / left leg, keeping the other knee somewhat bent.  Slightly point your toes inward on your back foot.  Keep your right / left heel on the floor, bend your back knee, and slightly shift your weight over the back leg so that you feel a gentle stretch deep in your back calf.  Hold this position for __________ seconds. Repeat __________ times. Complete this stretch __________ times per day. STRETCH - Gastrocsoleus, Standing  Note: This exercise can place a lot of stress on your foot and ankle. Please complete this exercise only if specifically instructed by your caregiver.   Place the ball of your right / left foot on a step, keeping your other foot firmly on the same step.  Hold on to the wall or a rail for balance.  Slowly lift your other foot, allowing your body weight to press your heel down over the edge of the step.  You should feel a stretch in your right / left calf.  Hold this position for __________ seconds.  Repeat this exercise with a slight bend in your right / left knee. Repeat __________ times. Complete this stretch __________ times per day.    STRENGTHENING EXERCISES - Plantar Fasciitis (Heel Spur Syndrome)  These exercises may help you when beginning to rehabilitate your injury. They may resolve your symptoms with or without further involvement from your physician, physical therapist or athletic trainer. While completing these exercises, remember:   Muscles can gain both the endurance and the strength needed for everyday activities through controlled exercises.  Complete these exercises as instructed by your physician, physical therapist or athletic trainer. Progress the resistance and repetitions only as guided. STRENGTH -  Towel Curls  Sit in a chair positioned on a non-carpeted surface.  Place your foot on a towel, keeping your heel on the floor.  Pull the towel toward your heel by only curling your toes. Keep your heel on the floor.  If instructed by your physician, physical therapist or athletic trainer, add ____________________ at the end of the towel. Repeat __________ times. Complete this exercise __________ times per day. STRENGTH - Ankle Inversion  Secure one end of a rubber exercise band/tubing to a fixed object (table, pole). Loop the other end around your foot just before your toes.  Place your fists between your knees. This will focus your strengthening at your ankle.  Slowly, pull your big toe up and in, making sure the band/tubing is positioned to resist the entire motion.  Hold this position for __________ seconds.  Have your muscles resist the band/tubing as it slowly pulls your foot back to the starting position. Repeat __________ times. Complete this exercises __________ times per day.  Document Released: 07/02/2005 Document Revised: 09/24/2011 Document Reviewed: 10/14/2008 [employer] Patient Information 2015 [employer]. This information is not intended to replace advice given to you by your health care provider. Make sure you discuss any questions you have with your health care provider.  

## 2015-04-14 NOTE — Progress Notes (Signed)
He presents today as a new patient chief complaint of x-rays look duration of pain to his left foot. He's been to see primary care provider and specialist a couple of times which suggested elevation and compression ice and anti-inflammatory's. He states that this is just getting worse and is getting to the point where can barely walk on it. He states that he has long hours at work and can hardly stand on his foot for that amount of time. He states that he slipped on ice back in December or January.   Objective: Vital signs are stable alert and oriented 3. I have reviewed his past medical history medications allergies surgeries and social history. 44 year old white male distress vital signs stable alert and oriented 3. Pulses are strongly palpable left. Neurologic sensorium is intact for Semmes-Weinstein monofilament. Deep tendon reflexes are intact bilateral muscle strength +5 over 5 dorsiflexion plantar flexors and inverters everters on physical musculatures intact. He has pain on palpation of the posterior tibial tendon of the left foot with pain on inversion against resistance. He also has swelling in the area. He has severe pain on palpation medial calcaneal tubercle of the left heel. Radiographs confirm no fractures but does demonstrate soft tissue increase in swelling along the medial aspect of the foot. As well as the soft tissue increase in density of the plantar fascial calcaneal insertion site.    assessment: plantar fasciitis with posterior tibial tendinitis with her compensatory or due to trauma left foot.  Plan: discussed etiology pathology conservative versus surgical therapies. Placed him on a Medrol Dosepak to be followed by meloxicam. Injected the left heel today with Kenalog and local and aesthetic. I also suggested plantar fascial night splint and the plantar fascial day splint discussed further shoe gear stretching exercises ice therapy issue modifications. I will follow-up with him in 1  month. If he is not improved we will consider an MRI of the left foot to evaluate the posterior tibial tendon.   Arbutus Ped DPM

## 2015-05-16 ENCOUNTER — Ambulatory Visit: Payer: Self-pay | Admitting: Family Medicine

## 2015-05-17 ENCOUNTER — Ambulatory Visit: Payer: BLUE CROSS/BLUE SHIELD | Admitting: Podiatry

## 2015-05-19 ENCOUNTER — Ambulatory Visit: Payer: Self-pay | Admitting: Family Medicine

## 2015-06-13 ENCOUNTER — Ambulatory Visit: Payer: Self-pay | Admitting: Family Medicine

## 2015-06-13 ENCOUNTER — Ambulatory Visit: Payer: BLUE CROSS/BLUE SHIELD | Admitting: Family Medicine

## 2015-06-13 ENCOUNTER — Telehealth: Payer: Self-pay | Admitting: Family Medicine

## 2015-06-15 ENCOUNTER — Telehealth: Payer: Self-pay | Admitting: Family Medicine

## 2015-06-15 NOTE — Telephone Encounter (Signed)
Called pt, lvm for pt to call back or schedule flu vacc via My Chart

## 2015-06-17 ENCOUNTER — Ambulatory Visit (INDEPENDENT_AMBULATORY_CARE_PROVIDER_SITE_OTHER): Payer: BLUE CROSS/BLUE SHIELD | Admitting: Family Medicine

## 2015-06-17 ENCOUNTER — Encounter: Payer: Self-pay | Admitting: Family Medicine

## 2015-06-17 VITALS — BP 144/96 | HR 84 | Temp 98.6°F | Ht 66.0 in | Wt 235.2 lb

## 2015-06-17 DIAGNOSIS — Z23 Encounter for immunization: Secondary | ICD-10-CM | POA: Diagnosis not present

## 2015-06-17 DIAGNOSIS — K219 Gastro-esophageal reflux disease without esophagitis: Secondary | ICD-10-CM | POA: Diagnosis not present

## 2015-06-17 DIAGNOSIS — E785 Hyperlipidemia, unspecified: Secondary | ICD-10-CM

## 2015-06-17 DIAGNOSIS — I10 Essential (primary) hypertension: Secondary | ICD-10-CM | POA: Diagnosis not present

## 2015-06-17 DIAGNOSIS — E669 Obesity, unspecified: Secondary | ICD-10-CM

## 2015-06-17 DIAGNOSIS — R739 Hyperglycemia, unspecified: Secondary | ICD-10-CM

## 2015-06-17 MED ORDER — LOSARTAN POTASSIUM-HCTZ 100-25 MG PO TABS: 1.0000 | ORAL_TABLET | Freq: Every day | ORAL | Status: DC

## 2015-06-17 MED ORDER — AMLODIPINE BESYLATE 10 MG PO TABS: 10.0000 mg | ORAL_TABLET | Freq: Every day | ORAL | Status: DC

## 2015-06-17 MED ORDER — ROSUVASTATIN CALCIUM 20 MG PO TABS: 20.0000 mg | ORAL_TABLET | Freq: Every day | ORAL | Status: DC

## 2015-06-17 MED ORDER — ESCITALOPRAM OXALATE 10 MG PO TABS: ORAL_TABLET | ORAL | Status: DC

## 2015-06-17 MED ORDER — OMEPRAZOLE 40 MG PO CPDR: 40.0000 mg | DELAYED_RELEASE_CAPSULE | Freq: Every day | ORAL | Status: DC

## 2015-06-17 MED ORDER — FENOFIBRATE 145 MG PO TABS: 145.0000 mg | ORAL_TABLET | Freq: Every day | ORAL | Status: DC

## 2015-06-17 NOTE — Progress Notes (Signed)
Pre visit review using our clinic review tool, if applicable. No additional management support is needed unless otherwise documented below in the visit note. 

## 2015-06-17 NOTE — Telephone Encounter (Signed)
No charge. 

## 2015-06-17 NOTE — Telephone Encounter (Signed)
Pt was no show 06/13/15 9:00am for follow up appt, pt came in today, charge or no charge?

## 2015-06-17 NOTE — Patient Instructions (Signed)
[facility] affected [facility] Astring[facility] daily after the shower  Cholesterol Cholesterol is a white, waxy, fat-like substance needed by your body in small amounts. The liver makes all the cholesterol you need. Cholesterol is carried from the liver by the blood through the blood vessels. Deposits of cholesterol (plaque) may build up on blood vessel walls. These make the arteries narrower and stiffer. Cholesterol plaques increase the risk for heart attack and stroke.  You cannot feel your cholesterol level even if it is very high. The only way to know it is high is with a blood test. Once you know your cholesterol levels, you should keep a record of the test results. Work with your health care provider to keep your levels in the desired range.  WHAT DO THE RESULTS MEAN?  Total cholesterol is a rough measure of all the cholesterol in your blood.   LDL is the so-called bad cholesterol. This is the type that deposits cholesterol in the walls of the arteries. You want this level to be low.   HDL is the good cholesterol because it cleans the arteries and carries the LDL away. You want this level to be high.  Triglycerides are fat that the body can either burn for energy or store. High levels are closely linked to heart disease.  WHAT ARE THE DESIRED LEVELS OF CHOLESTEROL?  Total cholesterol below 200.   LDL below 100 for people at risk, below 70 for those at very high risk.   HDL above 50 is good, above 60 is best.   Triglycerides below 150.  HOW CAN I LOWER MY CHOLESTEROL?  Diet. Follow your diet programs as directed by your health care provider.   Choose fish or white meat chicken and [place], roasted or baked. Limit fatty cuts of red meat, fried foods, and processed meats, such as sausage and lunch meats.   Eat lots of fresh fruits and vegetables.  Choose whole grains, beans, pasta, potatoes, and cereals.   Use only small amounts of olive, corn, or canola oils.   Avoid  butter, mayonnaise, shortening, or palm kernel oils.  Avoid foods with trans fats.   Drink skim or nonfat milk and eat low-fat or nonfat yogurt and cheeses. Avoid whole milk, cream, ice cream, egg yolks, and full-fat cheeses.   Healthy desserts include angel food cake, ginger snaps, animal crackers, hard candy, popsicles, and low-fat or nonfat frozen yogurt. Avoid pastries, cakes, pies, and cookies.   Exercise. Follow your exercise programs as directed by your health care provider.   A regular program helps decrease LDL and raise HDL.   A regular program helps with weight control.   Do things that increase your activity level like gardening, walking, or taking the stairs. Ask your health care provider about how you can be more active in your daily life.   Medicine. Take medicine only as directed by your health care provider.   Medicine may be prescribed by your health care provider to help lower cholesterol and decrease the risk for heart disease.   If you have several risk factors, you may need medicine even if your levels are normal.   This information is not intended to replace advice given to you by your health care provider. Make sure you discuss any questions you have with your health care provider.   Document Released: 03/27/2001 Document Revised: 07/23/2014 Document Reviewed: 04/15/2013 Elsevier Interactive Patient Education [employer]2016 Elsevier Inc.

## 2015-06-19 ENCOUNTER — Encounter: Payer: Self-pay | Admitting: Family Medicine

## 2015-06-19 NOTE — Assessment & Plan Note (Signed)
Encouraged DASH diet, decrease po intake and increase exercise as tolerated. Needs 7-8 hours of sleep nightly. Avoid trans fats, eat small, frequent meals every 4-5 hours with lean proteins, complex carbs and healthy fats. Minimize simple carbs 

## 2015-06-19 NOTE — Assessment & Plan Note (Signed)
minimize simple carbs. Increase exercise as tolerated.  

## 2015-06-19 NOTE — Assessment & Plan Note (Signed)
Tolerating statin, encouraged heart healthy diet, avoid trans fats, minimize simple carbs and saturated fats. Increase exercise as tolerated 

## 2015-06-19 NOTE — Assessment & Plan Note (Addendum)
Elevated but patient has been out of his meds for about a month, will restart and reevaluate. Given flu shot today

## 2015-06-19 NOTE — Progress Notes (Signed)
Subjective:    Patient ID: Jason Harper, male    DOB: 03/13/1971, 44 y.o.   MRN: [id number]  Chief Complaint  Patient presents with  . Follow-up    HPI Patient is in today for follow-up. Patient has been out of his meds for the past month. He reports he feels well. He reports he was not having any trouble with any of his medications. He denies any recent illness or other new acute complaint. Denies CP/palp/SOB/HA/congestion/fevers/GI or GU c/o. Taking meds as prescribed  Past Medical History  Diagnosis Date  . Hypertension   . High cholesterol   . Obesity 03/22/2015  . Hearing loss in right ear 03/22/2015    Secondary to Scarlet Fever as child    History reviewed. No pertinent past surgical history.  Family History  Problem Relation Age of Onset  . Hyperlipidemia Mother   . Hypertension Mother   . Arthritis Mother   . Hypertension Father   . Hyperlipidemia Father   . Heart disease Father     MI  . Hypertension Brother   . Heart disease Brother     MI    Social History   Social History  . Marital Status: Legally Separated    Spouse Name: N/A  . Number of Children: N/A  . Years of Education: N/A   Occupational History  . Not on file.   Social History Main Topics  . Smoking status: Never Smoker   . Smokeless tobacco: Never Used  . Alcohol Use: No  . Drug Use: No  . 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 Not on file   Social History Narrative    Outpatient Prescriptions Prior to Visit  Medication Sig Dispense Refill  . aspirin EC 81 MG tablet Take 81 mg by mouth daily.    Marland Kitchen. ibuprofen (ADVIL,MOTRIN) 800 MG tablet Take 1 tablet (800 mg total) by mouth every 8 (eight) hours as needed for pain. 40 tablet 1  . meloxicam (MOBIC) 15 MG tablet Take 1 tablet (15 mg total) by mouth daily. 30 tablet 3  . amLODipine (NORVASC) 10 MG tablet Take 1 tablet (10 mg total) by  mouth daily. 30 tablet 5  . fenofibrate (TRICOR) 145 MG tablet Take 1 tablet (145 mg total) by mouth daily. 30 tablet 5  . losartan-hydrochlorothiazide (HYZAAR) 100-25 MG per tablet Take 1 tablet by mouth daily. 30 tablet 5  . methylPREDNISolone (MEDROL) 4 MG TBPK tablet Tapering 6 day dose pack 21 tablet 0  . omeprazole (PRILOSEC) 40 MG capsule Take 1 capsule (40 mg total) by mouth daily. 30 capsule 5  . rosuvastatin (CRESTOR) 20 MG tablet Take 1 tablet (20 mg total) by mouth daily. 30 tablet 6   No facility-administered medications prior to visit.    No Known Allergies  Review of Systems  Constitutional: Negative for fever and malaise/fatigue.  HENT: Negative for congestion.   Eyes: Negative for discharge.  Respiratory: Negative for shortness of breath.   Cardiovascular: Negative for chest pain, palpitations and leg swelling.  Gastrointestinal: Negative for nausea and abdominal pain.  Genitourinary: Negative for dysuria.  Musculoskeletal: Negative for falls.  Skin: Negative for rash.  Neurological: Negative for loss of consciousness and headaches.  Endo/Heme/Allergies: Negative for environmental allergies.  Psychiatric/Behavioral: Negative for depression. The patient is not nervous/anxious.        Objective:    Physical Exam  Constitutional: He is oriented to person, place, and time. He appears well-developed and well-nourished. No distress.  HENT:  Head: Normocephalic and atraumatic.  Nose: Nose normal.  Eyes: Right eye exhibits no discharge. Left eye exhibits no discharge.  Neck: Normal range of motion. Neck supple.  Cardiovascular: Normal rate and regular rhythm.   No murmur heard. Pulmonary/Chest: Effort normal and breath sounds normal.  Abdominal: Soft. Bowel sounds are normal. There is no tenderness.  Musculoskeletal: He exhibits no edema.  Neurological: He is alert and oriented to person, place, and time.  Skin: Skin is warm and dry.  Psychiatric: He has a normal  mood and affect.  Nursing note and vitals reviewed.   BP 144/96 mmHg  Pulse 84  Temp(Src) 98.6 F (37 C) (Oral)  Ht  (1.676 m)  Wt 235 lb 4 oz (106.709 kg)  BMI 37.99 kg/m2  SpO2 98% Wt Readings from Last 3 Encounters:  06/17/15 235 lb 4 oz (106.709 kg)  03/22/15 234 lb (106.142 kg)  02/21/15 236 lb (107.049 kg)     Lab Results  Component Value Date   GLUCOSE 106* 03/22/2015   CHOL 194 02/21/2015   TRIG 225* 02/21/2015   HDL 24* 02/21/2015   LDLCALC 125* 02/21/2015   ALT 19 03/22/2015   AST 17 03/22/2015   NA 137 03/22/2015   K 3.7 03/22/2015   CL 101 03/22/2015   CREATININE 1.29 03/22/2015   BUN 16 03/22/2015   CO2 27 03/22/2015   TSH 1.286 12/16/2012   PSA 0.9 07/26/2014   HGBA1C 5.9 03/22/2015    Lab Results  Component Value Date   TSH 1.286 12/16/2012   No results found for: WBC, HGB, HCT, MCV, PLT Lab Results  Component Value Date   NA 137 03/22/2015   K 3.7 03/22/2015   CO2 27 03/22/2015   GLUCOSE 106* 03/22/2015   BUN 16 03/22/2015   CREATININE 1.29 03/22/2015   BILITOT 0.6 03/22/2015   ALKPHOS 49 03/22/2015   AST 17 03/22/2015   ALT 19 03/22/2015   PROT 7.4 03/22/2015   ALBUMIN 4.3 03/22/2015   CALCIUM 9.2 03/22/2015   GFR 64.87 03/22/2015   Lab Results  Component Value Date   CHOL 194 02/21/2015   Lab Results  Component Value Date   HDL 24* 02/21/2015   Lab Results  Component Value Date   LDLCALC 125* 02/21/2015   Lab Results  Component Value Date   TRIG 225* 02/21/2015   Lab Results  Component Value Date   CHOLHDL 8.1* 02/21/2015   Lab Results  Component Value Date   HGBA1C 5.9 03/22/2015       Assessment & Plan:   Problem List Items Addressed This Visit    GERD (gastroesophageal reflux disease)    Avoid offending foods, start probiotics. Do not eat large meals in late evening and consider raising head of bed.       Relevant Medications   omeprazole (PRILOSEC) 40 MG capsule   Hyperglycemia    minimize  simple carbs. Increase exercise as tolerated.       Hyperlipidemia with target LDL less than 100    Tolerating statin, encouraged heart healthy diet, avoid trans fats, minimize simple carbs and saturated fats. Increase exercise as tolerated      Relevant Medications   rosuvastatin (CRESTOR) 20 MG tablet   losartan-hydrochlorothiazide (HYZAAR) 100-25 MG tablet   fenofibrate (TRICOR) 145 MG tablet   amLODipine (NORVASC) 10 MG tablet   Hypertension  Elevated but patient has been out of his meds for about a month, will restart and reevaluate. Given flu shot today      Relevant Medications   rosuvastatin (CRESTOR) 20 MG tablet   losartan-hydrochlorothiazide (HYZAAR) 100-25 MG tablet   fenofibrate (TRICOR) 145 MG tablet   amLODipine (NORVASC) 10 MG tablet   Obesity    Encouraged DASH diet, decrease po intake and increase exercise as tolerated. Needs 7-8 hours of sleep nightly. Avoid trans fats, eat small, frequent meals every 4-5 hours with lean proteins, complex carbs and healthy fats. Minimize simple carbs       Other Visit Diagnoses    Encounter for immunization    -  Primary       I have discontinued Mr. Confer's methylPREDNISolone. I have also changed his losartan-hydrochlorothiazide. Additionally, I am having him start on escitalopram. Lastly, I am having him maintain his aspirin EC, ibuprofen, meloxicam, rosuvastatin, omeprazole, fenofibrate, and amLODipine.  Meds ordered this encounter  Medications  . rosuvastatin (CRESTOR) 20 MG tablet    Sig: Take 1 tablet (20 mg total) by mouth daily.    Dispense:  30 tablet    Refill:  6  . omeprazole (PRILOSEC) 40 MG capsule    Sig: Take 1 capsule (40 mg total) by mouth daily.    Dispense:  30 capsule    Refill:  5  . losartan-hydrochlorothiazide (HYZAAR) 100-25 MG tablet    Sig: Take 1 tablet by mouth daily.    Dispense:  30 tablet    Refill:  5  . fenofibrate (TRICOR) 145 MG tablet    Sig: Take 1 tablet (145 mg total) by  mouth daily.    Dispense:  30 tablet    Refill:  5  . amLODipine (NORVASC) 10 MG tablet    Sig: Take 1 tablet (10 mg total) by mouth daily.    Dispense:  30 tablet    Refill:  5  . escitalopram (LEXAPRO) 10 MG tablet    Sig: 1/2 tab po daily x 7 days then increase to 1 tab po daily    Dispense:  30 tablet    Refill:  3     Danise Edge, MD

## 2015-06-19 NOTE — Assessment & Plan Note (Signed)
Avoid offending foods, start probiotics. Do not eat large meals in late evening and consider raising head of bed.  

## 2015-09-20 ENCOUNTER — Ambulatory Visit: Payer: BLUE CROSS/BLUE SHIELD | Admitting: Family Medicine

## 2015-09-27 ENCOUNTER — Ambulatory Visit: Payer: BLUE CROSS/BLUE SHIELD | Admitting: Family Medicine

## 2015-10-17 ENCOUNTER — Ambulatory Visit (INDEPENDENT_AMBULATORY_CARE_PROVIDER_SITE_OTHER): Payer: BLUE CROSS/BLUE SHIELD | Admitting: Family Medicine

## 2015-10-17 ENCOUNTER — Encounter: Payer: Self-pay | Admitting: Family Medicine

## 2015-10-17 VITALS — BP 120/67 | HR 86 | Temp 97.9°F | Ht 65.0 in | Wt 238.4 lb

## 2015-10-17 DIAGNOSIS — M25572 Pain in left ankle and joints of left foot: Secondary | ICD-10-CM

## 2015-10-17 DIAGNOSIS — E669 Obesity, unspecified: Secondary | ICD-10-CM

## 2015-10-17 DIAGNOSIS — R739 Hyperglycemia, unspecified: Secondary | ICD-10-CM

## 2015-10-17 DIAGNOSIS — K219 Gastro-esophageal reflux disease without esophagitis: Secondary | ICD-10-CM | POA: Diagnosis not present

## 2015-10-17 DIAGNOSIS — I10 Essential (primary) hypertension: Secondary | ICD-10-CM

## 2015-10-17 DIAGNOSIS — E785 Hyperlipidemia, unspecified: Secondary | ICD-10-CM

## 2015-10-17 LAB — COMPREHENSIVE METABOLIC PANEL
ALBUMIN: 4.5 g/dL (ref 3.5–5.2)
ALT: 33 U/L (ref 0–53)
AST: 30 U/L (ref 0–37)
Alkaline Phosphatase: 52 U/L (ref 39–117)
BUN: 10 mg/dL (ref 6–23)
CHLORIDE: 101 meq/L (ref 96–112)
CO2: 26 mEq/L (ref 19–32)
CREATININE: 0.95 mg/dL (ref 0.40–1.50)
Calcium: 9.6 mg/dL (ref 8.4–10.5)
GFR: 92.08 mL/min (ref >60.00)
GLUCOSE: 99 mg/dL (ref 70–99)
Potassium: 3.5 mEq/L (ref 3.5–5.1)
SODIUM: 136 meq/L (ref 135–145)
TOTAL PROTEIN: 7.5 g/dL (ref 6.0–8.3)
Total Bilirubin: 0.5 mg/dL (ref 0.2–1.2)

## 2015-10-17 LAB — LIPID PANEL
CHOL/HDL RATIO: 6
CHOLESTEROL: 153 mg/dL (ref 0–200)
HDL: 26.8 mg/dL — ABNORMAL LOW (ref >39.00)
LDL CALC: 98 mg/dL (ref 0–99)
NONHDL: 126.16
Triglycerides: 142 mg/dL (ref 0.0–149.0)
VLDL: 28.4 mg/dL (ref 0.0–40.0)

## 2015-10-17 LAB — TSH: TSH: 1.45 u[IU]/mL (ref 0.35–4.50)

## 2015-10-17 LAB — CBC
HCT: 40.2 % (ref 39.0–52.0)
Hemoglobin: 13.3 g/dL (ref 13.0–17.0)
MCHC: 33.2 g/dL (ref 30.0–36.0)
MCV: 82 fl (ref 78.0–100.0)
PLATELETS: 393 10*3/uL (ref 150.0–400.0)
RBC: 4.9 Mil/uL (ref 4.22–5.81)
RDW: 14.3 % (ref 11.5–15.5)
WBC: 9.9 10*3/uL (ref 4.0–10.5)

## 2015-10-17 LAB — HEMOGLOBIN A1C: HEMOGLOBIN A1C: 6.1 % (ref 4.6–6.5)

## 2015-10-17 MED ORDER — LOSARTAN POTASSIUM-HCTZ 100-25 MG PO TABS: 1.0000 | ORAL_TABLET | Freq: Every day | ORAL | Status: DC

## 2015-10-17 MED ORDER — FENOFIBRATE 145 MG PO TABS: 145.0000 mg | ORAL_TABLET | Freq: Every day | ORAL | Status: DC

## 2015-10-17 MED ORDER — MELOXICAM 15 MG PO TABS: 15.0000 mg | ORAL_TABLET | Freq: Every day | ORAL | Status: DC

## 2015-10-17 MED ORDER — CYCLOBENZAPRINE HCL 10 MG PO TABS: 10.0000 mg | ORAL_TABLET | Freq: Every evening | ORAL | Status: DC | PRN

## 2015-10-17 MED ORDER — AMLODIPINE BESYLATE 10 MG PO TABS
10.0000 mg | ORAL_TABLET | Freq: Every day | ORAL | Status: DC
Start: 2015-10-17 — End: 2016-07-13

## 2015-10-17 NOTE — Assessment & Plan Note (Signed)
encouraged heart healthy diet, avoid trans fats, minimize simple carbs and saturated fats. Increase exercise as tolerated 

## 2015-10-17 NOTE — Patient Instructions (Signed)
Stretch your calf muscle and foot daily.  Apply Salonpas with lidocaine and Aspercreme twice daily.   Plantar Fasciitis Plantar fasciitis is a painful foot condition that affects the heel. It occurs when the band of tissue that connects the toes to the heel bone (plantar fascia) becomes irritated. This can happen after exercising too much or doing other repetitive activities (overuse injury). The pain from plantar fasciitis can range from mild irritation to severe pain that makes it difficult for you to walk or move. The pain is usually worse in the morning or after you have been sitting or lying down for a while. CAUSES This condition may be caused by:  Standing for long periods of time.  Wearing shoes that do not fit.  Doing high-impact activities, including running, aerobics, and ballet.  Being overweight.  Having an abnormal way of walking (gait).  Having tight calf muscles.  Having high arches in your feet.  Starting a new athletic activity. SYMPTOMS The main symptom of this condition is heel pain. Other symptoms include:  Pain that gets worse after activity or exercise.  Pain that is worse in the morning or after resting.  Pain that goes away after you walk for a few minutes. DIAGNOSIS This condition may be diagnosed based on your signs and symptoms. Your health care provider will also do a physical exam to check for:  A tender area on the bottom of your foot.  A high arch in your foot.  Pain when you move your foot.  Difficulty moving your foot. You may also need to have imaging studies to confirm the diagnosis. These can include:  X-rays.  Ultrasound.  MRI. TREATMENT  Treatment for plantar fasciitis depends on the severity of the condition. Your treatment may include:  Rest, ice, and over-the-counter pain medicines to manage your pain.  Exercises to stretch your calves and your plantar fascia.  A splint that holds your foot in a stretched, upward position  while you sleep (night splint).  Physical therapy to relieve symptoms and prevent problems in the future.  Cortisone injections to relieve severe pain.  Extracorporeal shock wave therapy (ESWT) to stimulate damaged plantar fascia with electrical impulses. It is often used as a last resort before surgery.  Surgery, if other treatments have not worked after 12 months. HOME CARE INSTRUCTIONS  Take medicines only as directed by your health care provider.  Avoid activities that cause pain.  Roll the bottom of your foot over a bag of ice or a bottle of cold water. Do this for 20 minutes, 3-4 times a day.  Perform simple stretches as directed by your health care provider.  Try wearing athletic shoes with air-sole or gel-sole cushions or soft shoe inserts.  Wear a night splint while sleeping, if directed by your health care provider.  Keep all follow-up appointments with your health care provider. PREVENTION   Do not perform exercises or activities that cause heel pain.  Consider finding low-impact activities if you continue to have problems.  Lose weight if you need to. The best way to prevent plantar fasciitis is to avoid the activities that aggravate your plantar fascia. SEEK MEDICAL CARE IF:  Your symptoms do not go away after treatment with home care measures.  Your pain gets worse.  Your pain affects your ability to move or do your daily activities.   This information is not intended to replace advice given to you by your health care provider. Make sure you discuss any questions you  have with your health care provider.   Document Released: 03/27/2001 Document Revised: 03/23/2015 Document Reviewed: 05/12/2014 Elsevier Interactive Patient Education [employer].

## 2015-10-17 NOTE — Progress Notes (Signed)
Subjective:    Patient ID: Jason Harper, male    DOB: 12/08/1970, 45 y.o.   MRN: [id number]  Chief Complaint  Patient presents with  . Follow-up    HPI Patient is in today for follow up and patient presents with concerns with left foot plantar fasciitis. It has been bothering since an injury in January of 2016, has tried inserts and injections. Injections help temporarily. Is following with podiatry. No recent illness or acute concerns. Stopped his Crestor. Denies CP/palp/SOB/HA/congestion/fevers/GI or GU c/o. Taking meds as prescribed   Past Medical History  Diagnosis Date  . Hypertension   . High cholesterol   . Obesity 03/22/2015  . Hearing loss in right ear 03/22/2015    Secondary to Scarlet Fever as child    No past surgical history on file.  Family History  Problem Relation Age of Onset  . Hyperlipidemia Mother   . Hypertension Mother   . Arthritis Mother   . Hypertension Father   . Hyperlipidemia Father   . Heart disease Father     MI  . Hypertension Brother   . Heart disease Brother     MI    Social History   Social History  . Marital Status: Legally Separated    Spouse Name: N/A  . Number of Children: N/A  . Years of Education: N/A   Occupational History  . Not on file.   Social History Main Topics  . Smoking status: Never Smoker   . Smokeless tobacco: Never Used  . Alcohol Use: No  . Drug Use: No  . 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 Not on file   Social History Narrative    Outpatient Prescriptions Prior to Visit  Medication Sig Dispense Refill  . aspirin EC 81 MG tablet Take 81 mg by mouth daily.    Marland Kitchen. escitalopram (LEXAPRO) 10 MG tablet 1/2 tab po daily x 7 days then increase to 1 tab po daily 30 tablet 3  . ibuprofen (ADVIL,MOTRIN) 800 MG tablet Take 1 tablet (800 mg total) by mouth every 8 (eight) hours as needed for pain. 40 tablet 1    . omeprazole (PRILOSEC) 40 MG capsule Take 1 capsule (40 mg total) by mouth daily. 30 capsule 5  . amLODipine (NORVASC) 10 MG tablet Take 1 tablet (10 mg total) by mouth daily. 30 tablet 5  . fenofibrate (TRICOR) 145 MG tablet Take 1 tablet (145 mg total) by mouth daily. 30 tablet 5  . losartan-hydrochlorothiazide (HYZAAR) 100-25 MG tablet Take 1 tablet by mouth daily. 30 tablet 5  . meloxicam (MOBIC) 15 MG tablet Take 1 tablet (15 mg total) by mouth daily. 30 tablet 3  . rosuvastatin (CRESTOR) 20 MG tablet Take 1 tablet (20 mg total) by mouth daily. 30 tablet 6   No facility-administered medications prior to visit.    No Known Allergies  Review of Systems  Constitutional: Negative for fever and malaise/fatigue.  HENT: Negative for congestion.   Eyes: Negative for blurred vision.  Respiratory: Negative for shortness of breath.   Cardiovascular: Negative for chest pain, palpitations and leg swelling.  Gastrointestinal: Negative for nausea, abdominal pain and blood in stool.  Genitourinary: Negative for dysuria and frequency.  Musculoskeletal: Negative for falls.  Skin: Positive for rash.  Neurological: Negative for dizziness, loss of consciousness and headaches.  Endo/Heme/Allergies: Negative for environmental allergies.  Psychiatric/Behavioral: Negative for  depression. The patient is not nervous/anxious.        Objective:    Physical Exam  Constitutional: He is oriented to person, place, and time. He appears well-developed and well-nourished. No distress.  HENT:  Head: Normocephalic and atraumatic.  Eyes: Conjunctivae are normal.  Neck: Neck supple. No thyromegaly present.  Cardiovascular: Normal rate, regular rhythm and normal heart sounds.   No murmur heard. Pulmonary/Chest: Effort normal and breath sounds normal. No respiratory distress. He has no wheezes.  Abdominal: Soft. Bowel sounds are normal. He exhibits no mass. There is no tenderness.  Musculoskeletal: He exhibits  no edema.  Lymphadenopathy:    He has no cervical adenopathy.  Neurological: He is alert and oriented to person, place, and time.  Skin: Skin is warm and dry. Rash noted.  2 mm raised erythematous lesion right flank 4-5 cm raised, erythematous oval leion on left arm  Psychiatric: He has a normal mood and affect. His behavior is normal.    BP 120/67 mmHg  Pulse 86  Temp(Src) 97.9 F (36.6 C) (Oral)  Ht  (1.651 m)  Wt 238 lb 6 oz (108.126 kg)  BMI 39.67 kg/m2  SpO2 99% Wt Readings from Last 3 Encounters:  10/17/15 238 lb 6 oz (108.126 kg)  06/17/15 235 lb 4 oz (106.709 kg)  03/22/15 234 lb (106.142 kg)     Lab Results  Component Value Date   WBC 9.9 10/17/2015   HGB 13.3 10/17/2015   HCT 40.2 10/17/2015   PLT 393.0 10/17/2015   GLUCOSE 99 10/17/2015   CHOL 153 10/17/2015   TRIG 142.0 10/17/2015   HDL 26.80* 10/17/2015   LDLCALC 98 10/17/2015   ALT 33 10/17/2015   AST 30 10/17/2015   NA 136 10/17/2015   K 3.5 10/17/2015   CL 101 10/17/2015   CREATININE 0.95 10/17/2015   BUN 10 10/17/2015   CO2 26 10/17/2015   TSH 1.45 10/17/2015   PSA 0.9 07/26/2014   HGBA1C 6.1 10/17/2015    Lab Results  Component Value Date   TSH 1.45 10/17/2015   Lab Results  Component Value Date   WBC 9.9 10/17/2015   HGB 13.3 10/17/2015   HCT 40.2 10/17/2015   MCV 82.0 10/17/2015   PLT 393.0 10/17/2015   Lab Results  Component Value Date   NA 136 10/17/2015   K 3.5 10/17/2015   CO2 26 10/17/2015   GLUCOSE 99 10/17/2015   BUN 10 10/17/2015   CREATININE 0.95 10/17/2015   BILITOT 0.5 10/17/2015   ALKPHOS 52 10/17/2015   AST 30 10/17/2015   ALT 33 10/17/2015   PROT 7.5 10/17/2015   ALBUMIN 4.5 10/17/2015   CALCIUM 9.6 10/17/2015   GFR 92.08 10/17/2015   Lab Results  Component Value Date   CHOL 153 10/17/2015   Lab Results  Component Value Date   HDL 26.80* 10/17/2015   Lab Results  Component Value Date   LDLCALC 98 10/17/2015   Lab Results  Component Value  Date   TRIG 142.0 10/17/2015   Lab Results  Component Value Date   CHOLHDL 6 10/17/2015   Lab Results  Component Value Date   HGBA1C 6.1 10/17/2015       Assessment & Plan:   Problem List Items Addressed This Visit    GERD (gastroesophageal reflux disease)    Avoid offending foods, start probiotics. Do not eat large meals in late evening and consider raising head of bed.       Relevant Orders  CBC (Completed)   TSH (Completed)   Comprehensive metabolic panel (Completed)   Lipid panel (Completed)   Hemoglobin A1c (Completed)   Hyperglycemia    hgba1c acceptable, minimize simple carbs. Increase exercise as tolerated.       Relevant Orders   CBC (Completed)   TSH (Completed)   Comprehensive metabolic panel (Completed)   Lipid panel (Completed)   Hemoglobin A1c (Completed)   Hyperlipidemia with target LDL less than 100    encouraged heart healthy diet, avoid trans fats, minimize simple carbs and saturated fats. Increase exercise as tolerated      Relevant Medications   losartan-hydrochlorothiazide (HYZAAR) 100-25 MG tablet   fenofibrate (TRICOR) 145 MG tablet   amLODipine (NORVASC) 10 MG tablet   Other Relevant Orders   CBC (Completed)   TSH (Completed)   Comprehensive metabolic panel (Completed)   Lipid panel (Completed)   Hemoglobin A1c (Completed)   Hypertension - Primary    Well controlled, no changes to meds. Encouraged heart healthy diet such as the DASH diet and exercise as tolerated.       Relevant Medications   losartan-hydrochlorothiazide (HYZAAR) 100-25 MG tablet   fenofibrate (TRICOR) 145 MG tablet   amLODipine (NORVASC) 10 MG tablet   Other Relevant Orders   CBC (Completed)   TSH (Completed)   Comprehensive metabolic panel (Completed)   Lipid panel (Completed)   Hemoglobin A1c (Completed)   Obesity    Encouraged DASH diet, decrease po intake and increase exercise as tolerated. Needs 7-8 hours of sleep nightly. Avoid trans fats, eat small,  frequent meals every 4-5 hours with lean proteins, complex carbs and healthy fats. Minimize simple carbs, GMO foods.      Pain in joint, ankle and foot    Left ankle and left heel spur s/p injury 1/16, is following with podiatry, encouraged ice, stretching, topical treatments and inserts. Pain is keeping him up. Encouraged daily meloxicam and will add Cyclobenzaprine qhs prn         I have discontinued Mr. Crescenzo rosuvastatin. I am also having him start on cyclobenzaprine. Additionally, I am having him maintain his aspirin EC, ibuprofen, omeprazole, escitalopram, Omega-3 Fatty Acids (FISH OIL OMEGA-3 PO), losartan-hydrochlorothiazide, fenofibrate, amLODipine, and meloxicam.  Meds ordered this encounter  Medications  . Omega-3 Fatty Acids (FISH OIL OMEGA-3 PO)    Sig: Take 1 tablet by mouth daily.  Marland Kitchen losartan-hydrochlorothiazide (HYZAAR) 100-25 MG tablet    Sig: Take 1 tablet by mouth daily.    Dispense:  30 tablet    Refill:  5  . fenofibrate (TRICOR) 145 MG tablet    Sig: Take 1 tablet (145 mg total) by mouth daily.    Dispense:  30 tablet    Refill:  5  . amLODipine (NORVASC) 10 MG tablet    Sig: Take 1 tablet (10 mg total) by mouth daily.    Dispense:  30 tablet    Refill:  5  . meloxicam (MOBIC) 15 MG tablet    Sig: Take 1 tablet (15 mg total) by mouth daily.    Dispense:  30 tablet    Refill:  3  . cyclobenzaprine (FLEXERIL) 10 MG tablet    Sig: Take 1 tablet (10 mg total) by mouth at bedtime as needed for muscle spasms.    Dispense:  30 tablet    Refill:  1     Danise Edge, MD

## 2015-10-17 NOTE — Assessment & Plan Note (Signed)
hgba1c acceptable, minimize simple carbs. Increase exercise as tolerated.  

## 2015-10-17 NOTE — Assessment & Plan Note (Signed)
Avoid offending foods, start probiotics. Do not eat large meals in late evening and consider raising head of bed.  

## 2015-10-17 NOTE — Assessment & Plan Note (Signed)
Encouraged DASH diet, decrease po intake and increase exercise as tolerated. Needs 7-8 hours of sleep nightly. Avoid trans fats, eat small, frequent meals every 4-5 hours with lean proteins, complex carbs and healthy fats. Minimize simple carbs, GMO foods. 

## 2015-10-17 NOTE — Assessment & Plan Note (Signed)
Well controlled, no changes to meds. Encouraged heart healthy diet such as the DASH diet and exercise as tolerated.  °

## 2015-10-17 NOTE — Assessment & Plan Note (Signed)
Left ankle and left heel spur s/p injury 1/16, is following with podiatry, encouraged ice, stretching, topical treatments and inserts. Pain is keeping him up. Encouraged daily meloxicam and will add Cyclobenzaprine qhs prn

## 2015-10-17 NOTE — Progress Notes (Signed)
Pre visit review using our clinic review tool, if applicable. No additional management support is needed unless otherwise documented below in the visit note. 

## 2016-01-23 ENCOUNTER — Encounter: Payer: Self-pay | Admitting: Family Medicine

## 2016-01-23 ENCOUNTER — Ambulatory Visit (INDEPENDENT_AMBULATORY_CARE_PROVIDER_SITE_OTHER): Payer: BLUE CROSS/BLUE SHIELD | Admitting: Family Medicine

## 2016-01-23 DIAGNOSIS — R739 Hyperglycemia, unspecified: Secondary | ICD-10-CM | POA: Diagnosis not present

## 2016-01-23 DIAGNOSIS — E785 Hyperlipidemia, unspecified: Secondary | ICD-10-CM

## 2016-01-23 DIAGNOSIS — E669 Obesity, unspecified: Secondary | ICD-10-CM | POA: Diagnosis not present

## 2016-01-23 DIAGNOSIS — I10 Essential (primary) hypertension: Secondary | ICD-10-CM

## 2016-01-23 MED ORDER — TIZANIDINE HCL 4 MG PO TABS
4.0000 mg | ORAL_TABLET | Freq: Three times a day (TID) | ORAL | Status: DC | PRN
Start: 2016-01-23 — End: 2016-07-13

## 2016-01-23 MED ORDER — TRAMADOL HCL 50 MG PO TABS: 50.0000 mg | ORAL_TABLET | Freq: Two times a day (BID) | ORAL | Status: DC | PRN

## 2016-01-23 NOTE — Progress Notes (Signed)
Pre visit review using our clinic review tool, if applicable. No additional management support is needed unless otherwise documented below in the visit note. 

## 2016-01-23 NOTE — Patient Instructions (Signed)

## 2016-02-05 NOTE — Assessment & Plan Note (Signed)
minimize simple carbs. Increase exercise as tolerated.  

## 2016-02-05 NOTE — Assessment & Plan Note (Signed)
Encouraged heart healthy diet, increase exercise, avoid trans fats, consider a krill oil cap daily 

## 2016-02-05 NOTE — Assessment & Plan Note (Signed)
Encouraged DASH diet, decrease po intake and increase exercise as tolerated. Needs 7-8 hours of sleep nightly. Avoid trans fats, eat small, frequent meals every 4-5 hours with lean proteins, complex carbs and healthy fats. Minimize simple carbs 

## 2016-02-05 NOTE — Progress Notes (Signed)
Patient ID: Jason Harper, male   DOB: 11/16/1970, 45 y.o.   MRN: [id number]   Subjective:    Patient ID: Jason Harper, male    DOB: 09/25/1970, 45 y.o.   MRN: [id number]  Chief Complaint  Patient presents with  . Follow-up    HPI Patient is in today for follow up. Is feeling well most days. No recent illness or hospitalizations. Is noting stiffness in am especially in left foot but also in b/l legs. No injury. Denies CP/palp/SOB/HA/congestion/fevers/GI or GU c/o. Taking meds as prescribed. No polyuria or polydipsia  Past Medical History:  Diagnosis Date  . Hearing loss in right ear 03/22/2015   Secondary to Scarlet Fever as child  . High cholesterol   . Hypertension   . Obesity 03/22/2015    No past surgical history on file.  Family History  Problem Relation Age of Onset  . Hyperlipidemia Mother   . Hypertension Mother   . Arthritis Mother   . Hypertension Father   . Hyperlipidemia Father   . Heart disease Father     MI  . Hypertension Brother   . Heart disease Brother     MI    Social History   Social History  . Marital status: Legally Separated    Spouse name: N/A  . Number of children: N/A  . Years of education: N/A   Occupational History  . Not on file.   Social History Main Topics  . Smoking status: Never Smoker  . Smokeless tobacco: Never Used  . Alcohol use No  . Drug use: No  . 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 Not on file   Social History Narrative  . No narrative on file    Outpatient Medications Prior to Visit  Medication Sig Dispense Refill  . amLODipine (NORVASC) 10 MG tablet Take 1 tablet (10 mg total) by mouth daily. 30 tablet 5  . aspirin EC 81 MG tablet Take 81 mg by mouth daily.    Marland Kitchen escitalopram (LEXAPRO) 10 MG tablet 1/2 tab po daily x 7 days then increase to 1 tab po daily 30 tablet 3  . fenofibrate (TRICOR) 145 MG tablet Take 1  tablet (145 mg total) by mouth daily. 30 tablet 5  . losartan-hydrochlorothiazide (HYZAAR) 100-25 MG tablet Take 1 tablet by mouth daily. 30 tablet 5  . meloxicam (MOBIC) 15 MG tablet Take 1 tablet (15 mg total) by mouth daily. 30 tablet 3  . Omega-3 Fatty Acids (FISH OIL OMEGA-3 PO) Take 1 tablet by mouth daily.    Marland Kitchen omeprazole (PRILOSEC) 40 MG capsule Take 1 capsule (40 mg total) by mouth daily. 30 capsule 5  . cyclobenzaprine (FLEXERIL) 10 MG tablet Take 1 tablet (10 mg total) by mouth at bedtime as needed for muscle spasms. 30 tablet 1  . ibuprofen (ADVIL,MOTRIN) 800 MG tablet Take 1 tablet (800 mg total) by mouth every 8 (eight) hours as needed for pain. 40 tablet 1   No facility-administered medications prior to visit.     No Known Allergies  Review of Systems  Constitutional: Negative for fever and malaise/fatigue.  HENT: Negative for congestion.   Eyes: Negative for blurred vision.  Respiratory: Negative for shortness of breath.   Cardiovascular: Negative for chest pain, palpitations and leg swelling.  Gastrointestinal: Negative for abdominal pain, blood in stool and nausea.  Genitourinary: Negative for dysuria and frequency.  Musculoskeletal: Positive for back pain, joint pain and myalgias. Negative for falls.  Skin: Negative for rash.  Neurological: Negative for dizziness, loss of consciousness and headaches.  Endo/Heme/Allergies: Negative for environmental allergies.  Psychiatric/Behavioral: Negative for depression. The patient is not nervous/anxious.        Objective:    Physical Exam  Constitutional: He is oriented to person, place, and time. He appears well-developed and well-nourished. No distress.  HENT:  Head: Normocephalic and atraumatic.  Nose: Nose normal.  Eyes: Right eye exhibits no discharge. Left eye exhibits no discharge.  Neck: Normal range of motion. Neck supple.  Cardiovascular: Normal rate and regular rhythm.   No murmur heard. Pulmonary/Chest:  Effort normal and breath sounds normal.  Abdominal: Soft. Bowel sounds are normal. There is no tenderness.  Musculoskeletal: He exhibits no edema.  Neurological: He is alert and oriented to person, place, and time.  Skin: Skin is warm and dry.  Psychiatric: He has a normal mood and affect.  Nursing note and vitals reviewed.   BP 112/70   Pulse 72   Temp 98.3 F (36.8 C) (Oral)   Ht  (1.651 m)   Wt 242 lb 4 oz (109.9 kg)   SpO2 96%   BMI 40.31 kg/m  Wt Readings from Last 3 Encounters:  01/23/16 242 lb 4 oz (109.9 kg)  10/17/15 238 lb 6 oz (108.1 kg)  06/17/15 235 lb 4 oz (106.7 kg)     Lab Results  Component Value Date   WBC 9.9 10/17/2015   HGB 13.3 10/17/2015   HCT 40.2 10/17/2015   PLT 393.0 10/17/2015   GLUCOSE 99 10/17/2015   CHOL 153 10/17/2015   TRIG 142.0 10/17/2015   HDL 26.80 (L) 10/17/2015   LDLCALC 98 10/17/2015   ALT 33 10/17/2015   AST 30 10/17/2015   NA 136 10/17/2015   K 3.5 10/17/2015   CL 101 10/17/2015   CREATININE 0.95 10/17/2015   BUN 10 10/17/2015   CO2 26 10/17/2015   TSH 1.45 10/17/2015   PSA 0.9 07/26/2014   HGBA1C 6.1 10/17/2015    Lab Results  Component Value Date   TSH 1.45 10/17/2015   Lab Results  Component Value Date   WBC 9.9 10/17/2015   HGB 13.3 10/17/2015   HCT 40.2 10/17/2015   MCV 82.0 10/17/2015   PLT 393.0 10/17/2015   Lab Results  Component Value Date   NA 136 10/17/2015   K 3.5 10/17/2015   CO2 26 10/17/2015   GLUCOSE 99 10/17/2015   BUN 10 10/17/2015   CREATININE 0.95 10/17/2015   BILITOT 0.5 10/17/2015   ALKPHOS 52 10/17/2015   AST 30 10/17/2015   ALT 33 10/17/2015   PROT 7.5 10/17/2015   ALBUMIN 4.5 10/17/2015   CALCIUM 9.6 10/17/2015   GFR 92.08 10/17/2015   Lab Results  Component Value Date   CHOL 153 10/17/2015   Lab Results  Component Value Date   HDL 26.80 (L) 10/17/2015   Lab Results  Component Value Date   LDLCALC 98 10/17/2015   Lab Results  Component Value Date   TRIG  142.0 10/17/2015   Lab Results  Component Value Date   CHOLHDL 6 10/17/2015   Lab Results  Component Value Date   HGBA1C 6.1 10/17/2015       Assessment & Plan:   Problem List Items Addressed This Visit    Hypertension    Well controlled, no changes to meds. Encouraged heart healthy diet such as the  DASH diet and exercise as tolerated.       Hyperlipidemia with target LDL less than 100    Encouraged heart healthy diet, increase exercise, avoid trans fats, consider a krill oil cap daily      Obesity    Encouraged DASH diet, decrease po intake and increase exercise as tolerated. Needs 7-8 hours of sleep nightly. Avoid trans fats, eat small, frequent meals every 4-5 hours with lean proteins, complex carbs and healthy fats. Minimize simple carbs      Hyperglycemia     minimize simple carbs. Increase exercise as tolerated.       Other Visit Diagnoses   None.     I have discontinued Mr. Souder ibuprofen and cyclobenzaprine. I am also having him start on traMADol and tiZANidine. Additionally, I am having him maintain his aspirin EC, omeprazole, escitalopram, Omega-3 Fatty Acids (FISH OIL OMEGA-3 PO), losartan-hydrochlorothiazide, fenofibrate, amLODipine, and meloxicam.  Meds ordered this encounter  Medications  . traMADol (ULTRAM) 50 MG tablet    Sig: Take 1 tablet (50 mg total) by mouth every 12 (twelve) hours as needed for moderate pain or severe pain.    Dispense:  60 tablet    Refill:  0  . tiZANidine (ZANAFLEX) 4 MG tablet    Sig: Take 1 tablet (4 mg total) by mouth every 8 (eight) hours as needed for muscle spasms.    Dispense:  90 tablet    Refill:  3     Danise Edge, MD

## 2016-02-05 NOTE — Assessment & Plan Note (Signed)
Well controlled, no changes to meds. Encouraged heart healthy diet such as the DASH diet and exercise as tolerated.  °

## 2016-05-18 ENCOUNTER — Ambulatory Visit: Payer: BLUE CROSS/BLUE SHIELD | Admitting: Family Medicine

## 2016-05-24 ENCOUNTER — Ambulatory Visit (INDEPENDENT_AMBULATORY_CARE_PROVIDER_SITE_OTHER): Payer: BLUE CROSS/BLUE SHIELD | Admitting: Family Medicine

## 2016-05-24 ENCOUNTER — Encounter: Payer: Self-pay | Admitting: Family Medicine

## 2016-05-24 VITALS — BP 138/90 | HR 73 | Temp 98.3°F | Ht 65.0 in | Wt 232.4 lb

## 2016-05-24 DIAGNOSIS — M722 Plantar fascial fibromatosis: Secondary | ICD-10-CM | POA: Diagnosis not present

## 2016-05-24 DIAGNOSIS — R739 Hyperglycemia, unspecified: Secondary | ICD-10-CM

## 2016-05-24 DIAGNOSIS — E782 Mixed hyperlipidemia: Secondary | ICD-10-CM | POA: Diagnosis not present

## 2016-05-24 DIAGNOSIS — I10 Essential (primary) hypertension: Secondary | ICD-10-CM | POA: Diagnosis not present

## 2016-05-24 DIAGNOSIS — M25572 Pain in left ankle and joints of left foot: Secondary | ICD-10-CM | POA: Diagnosis not present

## 2016-05-24 DIAGNOSIS — G8929 Other chronic pain: Secondary | ICD-10-CM

## 2016-05-24 DIAGNOSIS — E6609 Other obesity due to excess calories: Secondary | ICD-10-CM

## 2016-05-24 LAB — LIPID PANEL
CHOL/HDL RATIO: 7
CHOLESTEROL: 222 mg/dL — AB (ref 0–200)
HDL: 32 mg/dL — AB (ref >39.00)
LDL CALC: 160 mg/dL — AB (ref 0–99)
NonHDL: 189.95
TRIGLYCERIDES: 150 mg/dL — AB (ref 0.0–149.0)
VLDL: 30 mg/dL (ref 0.0–40.0)

## 2016-05-24 LAB — CBC
HCT: 41.3 % (ref 39.0–52.0)
HEMOGLOBIN: 13.5 g/dL (ref 13.0–17.0)
MCHC: 32.8 g/dL (ref 30.0–36.0)
MCV: 78.6 fl (ref 78.0–100.0)
PLATELETS: 394 10*3/uL (ref 150.0–400.0)
RBC: 5.25 Mil/uL (ref 4.22–5.81)
RDW: 14.7 % (ref 11.5–15.5)
WBC: 12.3 10*3/uL — AB (ref 4.0–10.5)

## 2016-05-24 LAB — COMPREHENSIVE METABOLIC PANEL
ALK PHOS: 69 U/L (ref 39–117)
ALT: 25 U/L (ref 0–53)
AST: 23 U/L (ref 0–37)
Albumin: 4.3 g/dL (ref 3.5–5.2)
BILIRUBIN TOTAL: 0.4 mg/dL (ref 0.2–1.2)
BUN: 11 mg/dL (ref 6–23)
CALCIUM: 9.2 mg/dL (ref 8.4–10.5)
CO2: 28 mEq/L (ref 19–32)
Chloride: 102 mEq/L (ref 96–112)
Creatinine, Ser: 0.96 mg/dL (ref 0.40–1.50)
GFR: 90.71 mL/min (ref >60.00)
GLUCOSE: 90 mg/dL (ref 70–99)
POTASSIUM: 3.6 meq/L (ref 3.5–5.1)
Sodium: 138 mEq/L (ref 135–145)
TOTAL PROTEIN: 7.8 g/dL (ref 6.0–8.3)

## 2016-05-24 LAB — HEMOGLOBIN A1C: Hgb A1c MFr Bld: 6 % (ref 4.6–6.5)

## 2016-05-24 LAB — TSH: TSH: 2.19 u[IU]/mL (ref 0.35–4.50)

## 2016-05-24 MED ORDER — TRAMADOL HCL 50 MG PO TABS: 50.0000 mg | ORAL_TABLET | Freq: Two times a day (BID) | ORAL | 0 refills | Status: DC | PRN

## 2016-05-24 NOTE — Progress Notes (Signed)
Pre visit review using our clinic review tool, if applicable. No additional management support is needed unless otherwise documented below in the visit note. 

## 2016-05-24 NOTE — Patient Instructions (Addendum)
Lidocaine gel as needed for foot and ankle pain   Basic Carbohydrate Counting for Diabetes Mellitus Carbohydrate counting is a method for keeping track of the amount of carbohydrates you eat. Eating carbohydrates naturally increases the level of sugar (glucose) in your blood, so it is important for you to know the amount that is okay for you to have in every meal. Carbohydrate counting helps keep the level of glucose in your blood within normal limits. The amount of carbohydrates allowed is different for every person. A dietitian can help you calculate the amount that is right for you. Once you know the amount of carbohydrates you can have, you can count the carbohydrates in the foods you want to eat. Carbohydrates are found in the following foods:  Grains, such as breads and cereals.  Dried beans and soy products.  Starchy vegetables, such as potatoes, peas, and corn.  Fruit and fruit juices.  Milk and yogurt.  Sweets and snack foods, such as cake, cookies, candy, chips, soft drinks, and fruit drinks. CARBOHYDRATE COUNTING There are two ways to count the carbohydrates in your food. You can use either of the methods or a combination of both. Reading the "Nutrition Facts" on Packaged Food The "Nutrition Facts" is an area that is included on the labels of almost all packaged food and beverages in the [place]. It includes the serving size of that food or beverage and information about the nutrients in each serving of the food, including the grams (g) of carbohydrate per serving.  Decide the number of servings of this food or beverage that you will be able to eat or drink. Multiply that number of servings by the number of grams of carbohydrate that is listed on the label for that serving. The total will be the amount of carbohydrates you will be having when you eat or drink this food or beverage. Learning Standard Serving Sizes of Food When you eat food that is not packaged or does not  include "Nutrition Facts" on the label, you need to measure the servings in order to count the amount of carbohydrates.A serving of most carbohydrate-rich foods contains about 15 g of carbohydrates. The following list includes serving sizes of carbohydrate-rich foods that provide 15 g ofcarbohydrate per serving:   1 slice of bread (1 oz) or 1 six-inch tortilla.    of a hamburger bun or English muffin.  4-6 crackers.   cup unsweetened dry cereal.    cup hot cereal.   cup rice or pasta.    cup mashed potatoes or  of a large baked potato.  1 cup fresh fruit or one small piece of fruit.    cup canned or frozen fruit or fruit juice.  1 cup milk.   cup plain fat-free yogurt or yogurt sweetened with artificial sweeteners.   cup cooked dried beans or starchy vegetable, such as peas, corn, or potatoes.  Decide the number of standard-size servings that you will eat. Multiply that number of servings by 15 (the grams of carbohydrates in that serving). For example, if you eat 2 cups of strawberries, you will have eaten 2 servings and 30 g of carbohydrates (2 servings x 15 g = 30 g). For foods such as soups and casseroles, in which more than one food is mixed in, you will need to count the carbohydrates in each food that is included. EXAMPLE OF CARBOHYDRATE COUNTING Sample Dinner  3 oz chicken breast.   cup of brown rice.   cup of  corn.  1 cup milk.   1 cup strawberries with sugar-free whipped topping.  Carbohydrate Calculation Step 1: Identify the foods that contain carbohydrates:   Rice.   Corn.   Milk.   Strawberries. Step 2:Calculate the number of servings eaten of each:   2 servings of rice.   1 serving of corn.   1 serving of milk.   1 serving of strawberries. Step 3: Multiply each of those number of servings by 15 g:   2 servings of rice x 15 g = 30 g.   1 serving of corn x 15 g = 15 g.   1 serving of milk x 15 g = 15 g.   1  serving of strawberries x 15 g = 15 g. Step 4: Add together all of the amounts to find the total grams of carbohydrates eaten: 30 g + 15 g + 15 g + 15 g = 75 g.   This information is not intended to replace advice given to you by your health care provider. Make sure you discuss any questions you have with your health care provider.   Document Released: 07/02/2005 Document Revised: 07/23/2014 Document Reviewed: 05/29/2013 Elsevier Interactive Patient Education [employer].

## 2016-05-25 ENCOUNTER — Other Ambulatory Visit: Payer: Self-pay | Admitting: Family Medicine

## 2016-05-25 DIAGNOSIS — D72829 Elevated white blood cell count, unspecified: Secondary | ICD-10-CM

## 2016-06-03 NOTE — Assessment & Plan Note (Signed)
Encouraged DASH diet, decrease po intake and increase exercise as tolerated. Needs 7-8 hours of sleep nightly. Avoid trans fats, eat small, frequent meals every 4-5 hours with lean proteins, complex carbs and healthy fats. Minimize simple carbs 

## 2016-06-03 NOTE — Assessment & Plan Note (Signed)
Has been present for months. No injury or fall. Referred to sports medicine for further consideration.

## 2016-06-03 NOTE — Assessment & Plan Note (Signed)
Well controlled, no changes to meds. Encouraged heart healthy diet such as the DASH diet and exercise as tolerated. Improved on recheck 

## 2016-06-03 NOTE — Progress Notes (Signed)
Patient ID: Jason Harper, male   DOB: 1971-01-27, 45 y.o.   MRN: [id number]   Subjective:    Patient ID: Jason Harper, male    DOB: 02-13-1971, 45 y.o.   MRN: [id number]  Chief Complaint  Patient presents with  . Follow-up    HPI Patient is in today for follow up. His greatest complaint is persistent pain in his feet, right mostly. No falls or trauma. No warmth or erythema. Foot hurst routinely but is worse with prolonged use. Took his flu shot at work. Denies CP/palp/SOB/HA/congestion/fevers/GI or GU c/o. Taking meds as prescribed  Past Medical History:  Diagnosis Date  . Hearing loss in right ear 03/22/2015   Secondary to Scarlet Fever as child  . High cholesterol   . Hypertension   . Obesity 03/22/2015    No past surgical history on file.  Family History  Problem Relation Age of Onset  . Hyperlipidemia Mother   . Hypertension Mother   . Arthritis Mother   . Hypertension Father   . Hyperlipidemia Father   . Heart disease Father     MI  . Hypertension Brother   . Heart disease Brother     MI    Social History   Social History  . Marital status: Legally Separated    Spouse name: N/A  . Number of children: N/A  . Years of education: N/A   Occupational History  . Not on file.   Social History Main Topics  . Smoking status: Never Smoker  . Smokeless tobacco: Never Used  . Alcohol use No  . Drug use: No  . 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 Not on file   Social History Narrative  . No narrative on file    Outpatient Medications Prior to Visit  Medication Sig Dispense Refill  . amLODipine (NORVASC) 10 MG tablet Take 1 tablet (10 mg total) by mouth daily. 30 tablet 5  . aspirin EC 81 MG tablet Take 81 mg by mouth daily.    Marland Kitchen escitalopram (LEXAPRO) 10 MG tablet 1/2 tab po daily x 7 days then increase to 1 tab po daily 30 tablet 3  . fenofibrate (TRICOR) 145  MG tablet Take 1 tablet (145 mg total) by mouth daily. 30 tablet 5  . losartan-hydrochlorothiazide (HYZAAR) 100-25 MG tablet Take 1 tablet by mouth daily. 30 tablet 5  . meloxicam (MOBIC) 15 MG tablet Take 1 tablet (15 mg total) by mouth daily. 30 tablet 3  . Omega-3 Fatty Acids (FISH OIL OMEGA-3 PO) Take 1 tablet by mouth daily.    Marland Kitchen omeprazole (PRILOSEC) 40 MG capsule Take 1 capsule (40 mg total) by mouth daily. 30 capsule 5  . tiZANidine (ZANAFLEX) 4 MG tablet Take 1 tablet (4 mg total) by mouth every 8 (eight) hours as needed for muscle spasms. 90 tablet 3  . traMADol (ULTRAM) 50 MG tablet Take 1 tablet (50 mg total) by mouth every 12 (twelve) hours as needed for moderate pain or severe pain. 60 tablet 0   No facility-administered medications prior to visit.     No Known Allergies  Review of Systems  Constitutional: Positive for malaise/fatigue. Negative for fever.  HENT: Negative for congestion.   Eyes: Negative for blurred vision.  Respiratory: Negative for shortness of breath.   Cardiovascular: Negative for chest pain, palpitations and leg swelling.  Gastrointestinal: Negative for abdominal pain, blood  in stool and nausea.  Genitourinary: Negative for dysuria and frequency.  Musculoskeletal: Positive for joint pain. Negative for falls.  Skin: Negative for rash.  Neurological: Negative for dizziness, loss of consciousness and headaches.  Endo/Heme/Allergies: Negative for environmental allergies.  Psychiatric/Behavioral: Negative for depression. The patient is nervous/anxious.        Objective:    Physical Exam  Constitutional: He is oriented to person, place, and time. He appears well-developed and well-nourished. No distress.  HENT:  Head: Normocephalic and atraumatic.  Nose: Nose normal.  Eyes: Right eye exhibits no discharge. Left eye exhibits no discharge.  Neck: Normal range of motion. Neck supple.  Cardiovascular: Normal rate and regular rhythm.   No murmur  heard. Pulmonary/Chest: Effort normal and breath sounds normal.  Abdominal: Soft. Bowel sounds are normal. There is no tenderness.  Musculoskeletal: He exhibits no edema.  Neurological: He is alert and oriented to person, place, and time.  Skin: Skin is warm and dry.  Psychiatric: He has a normal mood and affect.  Nursing note and vitals reviewed.   BP 138/90   Pulse 73   Temp 98.3 F (36.8 C)   Ht 5\' 5"  (1.651 m)   Wt 232 lb 6 oz (105.4 kg)   SpO2 100%   BMI 38.67 kg/m  Wt Readings from Last 3 Encounters:  05/24/16 232 lb 6 oz (105.4 kg)  01/23/16 242 lb 4 oz (109.9 kg)  10/17/15 238 lb 6 oz (108.1 kg)     Lab Results  Component Value Date   WBC 12.3 (H) 05/24/2016   HGB 13.5 05/24/2016   HCT 41.3 05/24/2016   PLT 394.0 05/24/2016   GLUCOSE 90 05/24/2016   CHOL 222 (H) 05/24/2016   TRIG 150.0 (H) 05/24/2016   HDL 32.00 (L) 05/24/2016   LDLCALC 160 (H) 05/24/2016   ALT 25 05/24/2016   AST 23 05/24/2016   NA 138 05/24/2016   K 3.6 05/24/2016   CL 102 05/24/2016   CREATININE 0.96 05/24/2016   BUN 11 05/24/2016   CO2 28 05/24/2016   TSH 2.19 05/24/2016   PSA 0.9 07/26/2014   HGBA1C 6.0 05/24/2016    Lab Results  Component Value Date   TSH 2.19 05/24/2016   Lab Results  Component Value Date   WBC 12.3 (H) 05/24/2016   HGB 13.5 05/24/2016   HCT 41.3 05/24/2016   MCV 78.6 05/24/2016   PLT 394.0 05/24/2016   Lab Results  Component Value Date   NA 138 05/24/2016   K 3.6 05/24/2016   CO2 28 05/24/2016   GLUCOSE 90 05/24/2016   BUN 11 05/24/2016   CREATININE 0.96 05/24/2016   BILITOT 0.4 05/24/2016   ALKPHOS 69 05/24/2016   AST 23 05/24/2016   ALT 25 05/24/2016   PROT 7.8 05/24/2016   ALBUMIN 4.3 05/24/2016   CALCIUM 9.2 05/24/2016   GFR 90.71 05/24/2016   Lab Results  Component Value Date   CHOL 222 (H) 05/24/2016   Lab Results  Component Value Date   HDL 32.00 (L) 05/24/2016   Lab Results  Component Value Date   LDLCALC 160 (H)  05/24/2016   Lab Results  Component Value Date   TRIG 150.0 (H) 05/24/2016   Lab Results  Component Value Date   CHOLHDL 7 05/24/2016   Lab Results  Component Value Date   HGBA1C 6.0 05/24/2016       Assessment & Plan:   Problem List Items Addressed This Visit    Hypertension  Well controlled, no changes to meds. Encouraged heart healthy diet such as the DASH diet and exercise as tolerated. Improved on recheck      Obesity    Encouraged DASH diet, decrease po intake and increase exercise as tolerated. Needs 7-8 hours of sleep nightly. Avoid trans fats, eat small, frequent meals every 4-5 hours with lean proteins, complex carbs and healthy fats. Minimize simple carbs      Hyperglycemia   Relevant Orders   Hemoglobin A1c (Completed)   Pain in joint, ankle and foot    Has been present for months. No injury or fall. Referred to sports medicine for further consideration.        Other Visit Diagnoses    Chronic pain of left ankle    -  Primary   Relevant Medications   traMADol (ULTRAM) 50 MG tablet   Other Relevant Orders   Ambulatory referral to Sports Medicine   Comprehensive metabolic panel (Completed)   CBC (Completed)   TSH (Completed)   Lipid panel (Completed)   Hemoglobin A1c (Completed)   Plantar fasciitis of left foot       Relevant Orders   Ambulatory referral to Sports Medicine   Comprehensive metabolic panel (Completed)   CBC (Completed)   TSH (Completed)   Lipid panel (Completed)   Hemoglobin A1c (Completed)   Hyperlipidemia, mixed       Relevant Orders   Lipid panel (Completed)   HTN (hypertension), benign       Relevant Orders   CBC (Completed)   TSH (Completed)      I am having Mr. Pittinger maintain his aspirin EC, omeprazole, escitalopram, Omega-3 Fatty Acids (FISH OIL OMEGA-3 PO), losartan-hydrochlorothiazide, fenofibrate, amLODipine, meloxicam, tiZANidine, and traMADol.  Meds ordered this encounter  Medications  . traMADol (ULTRAM) 50 MG  tablet    Sig: Take 1 tablet (50 mg total) by mouth every 12 (twelve) hours as needed for moderate pain or severe pain.    Dispense:  60 tablet    Refill:  0     Danise EdgeBLYTH, Mavrik Bynum, MD

## 2016-06-12 ENCOUNTER — Encounter: Payer: Self-pay | Admitting: Family Medicine

## 2016-06-12 ENCOUNTER — Ambulatory Visit (INDEPENDENT_AMBULATORY_CARE_PROVIDER_SITE_OTHER): Payer: BLUE CROSS/BLUE SHIELD | Admitting: Family Medicine

## 2016-06-12 DIAGNOSIS — M25572 Pain in left ankle and joints of left foot: Secondary | ICD-10-CM | POA: Diagnosis not present

## 2016-06-12 MED ORDER — NITROGLYCERIN 0.2 MG/HR TD PT24: MEDICATED_PATCH | TRANSDERMAL | 1 refills | Status: DC

## 2016-06-12 NOTE — Patient Instructions (Signed)
You have plantar fasciitis, posterior tibialis tendinitis, and extensor tendinitis of your foot/ankle. Take meloxicam as you have been for pain and inflammation. Plantar fascia stretch for 20-30 seconds (do 3 of these) in morning Lowering/raise on a step exercises 3 x 10 once or twice a day - this is very important for long term recovery. Can add heel walks, toe walks forward and backward as well Ice heel for 15 minutes as needed. Avoid flat shoes/barefoot walking as much as possible. Arch straps have been shown to help with pain - wear these during the day. Try different inserts: dr. Jari Sportsmanscholls active series, spencos, or our green insoles with a scaphoid pad. Nitro patches 1/4th patch over your ankle, change daily. Consider custom orthotics, physical therapy, increasing the nitro dosage if you're not improving. Follow up with me in 5-6 weeks.

## 2016-06-13 NOTE — Progress Notes (Signed)
PCP and consultation requested by: Danise EdgeBLYTH, STACEY, MD  Subjective:   HPI: Patient is a 45 y.o. male here for left foot pain.  Patient reports he's had about 2 years of left foot pain. Started at that time when he fell on the ice, twisted this foot. Had pain bottom of foot and medial ankle/foot that has persisted since that time. Pain is 6/10, sharp. Worse with prolonged standing. Has tried several things: boot, medrol dose pack, meloxicam, plantar fascia splint, home exercise program, plantar fascia injection, inserts, frozen water bottle. Has since also developed pain dorsal foot/ankle. Has been icing, elevating, taking tramadol. No skin changes, numbness.  Past Medical History:  Diagnosis Date  . Hearing loss in right ear 03/22/2015   Secondary to Scarlet Fever as child  . High cholesterol   . Hypertension   . Obesity 03/22/2015    Current Outpatient Prescriptions on File Prior to Visit  Medication Sig Dispense Refill  . amLODipine (NORVASC) 10 MG tablet Take 1 tablet (10 mg total) by mouth daily. 30 tablet 5  . aspirin EC 81 MG tablet Take 81 mg by mouth daily.    Marland Kitchen. escitalopram (LEXAPRO) 10 MG tablet 1/2 tab po daily x 7 days then increase to 1 tab po daily 30 tablet 3  . fenofibrate (TRICOR) 145 MG tablet Take 1 tablet (145 mg total) by mouth daily. 30 tablet 5  . losartan-hydrochlorothiazide (HYZAAR) 100-25 MG tablet Take 1 tablet by mouth daily. 30 tablet 5  . meloxicam (MOBIC) 15 MG tablet Take 1 tablet (15 mg total) by mouth daily. 30 tablet 3  . Omega-3 Fatty Acids (FISH OIL OMEGA-3 PO) Take 1 tablet by mouth daily.    Marland Kitchen. omeprazole (PRILOSEC) 40 MG capsule Take 1 capsule (40 mg total) by mouth daily. 30 capsule 5  . tiZANidine (ZANAFLEX) 4 MG tablet Take 1 tablet (4 mg total) by mouth every 8 (eight) hours as needed for muscle spasms. 90 tablet 3  . traMADol (ULTRAM) 50 MG tablet Take 1 tablet (50 mg total) by mouth every 12 (twelve) hours as needed for moderate pain or  severe pain. 60 tablet 0   No current facility-administered medications on file prior to visit.     No past surgical history on file.  No Known Allergies  Social History   Social History  . Marital status: Legally Separated    Spouse name: N/A  . Number of children: N/A  . Years of education: N/A   Occupational History  . Not on file.   Social History Main Topics  . Smoking status: Never Smoker  . Smokeless tobacco: Never Used  . Alcohol use No  . Drug use: No  . 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 Not on file   Social History Narrative  . No narrative on file    Family History  Problem Relation Age of Onset  . Hyperlipidemia Mother   . Hypertension Mother   . Arthritis Mother   . Hypertension Father   . Hyperlipidemia Father   . Heart disease Father     MI  . Hypertension Brother   . Heart disease Brother     MI    BP 116/79   Pulse 76   Ht 5\' 5"  (1.651 m)   Wt 232 lb (105.2 kg)   BMI 38.61 kg/m   Review of Systems: See HPI above.  Objective:  Physical Exam:  Gen: NAD, comfortable in exam room  Left foot/ankle: No gross deformity, swelling, ecchymoses.  Mild pronation. FROM with mild pain on internal rotation. TTP within plantar fascia, course of posterior tibialis tendon, less over extensor digitorum. Negative ant drawer and talar tilt.   Negative syndesmotic compression. Negative calcaneal squeeze. Thompsons test negative. NV intact distally.   Assessment & Plan:  1. Left foot/ankle pain - 2/2 plantar fasciitis and posterior tibialis tendinitis initially - has extensor tendinitis now as well.  Has tried multiple things for this to date (OTC inserts, boot, steroids, plantar fascia splint, injection).  Will start with arch binder, nitro patches (discussed risks of headache, skin irritation), shown home exercises and stretches, discussed  other inserts.  F/u in 5-6 weeks.  Consider custom orthotics, physical therapy, increased nitro dosage if not improving.

## 2016-06-13 NOTE — Assessment & Plan Note (Signed)
2/2 plantar fasciitis and posterior tibialis tendinitis initially - has extensor tendinitis now as well.  Has tried multiple things for this to date (OTC inserts, boot, steroids, plantar fascia splint, injection).  Will start with arch binder, nitro patches (discussed risks of headache, skin irritation), shown home exercises and stretches, discussed other inserts.  F/u in 5-6 weeks.  Consider custom orthotics, physical therapy, increased nitro dosage if not improving.

## 2016-06-15 ENCOUNTER — Telehealth: Payer: Self-pay | Admitting: Family Medicine

## 2016-06-15 NOTE — Telephone Encounter (Signed)
No it will not

## 2016-06-15 NOTE — Telephone Encounter (Signed)
Spoke to patient and gave him information.

## 2016-06-26 ENCOUNTER — Ambulatory Visit: Payer: BLUE CROSS/BLUE SHIELD | Admitting: Family Medicine

## 2016-06-26 ENCOUNTER — Other Ambulatory Visit (INDEPENDENT_AMBULATORY_CARE_PROVIDER_SITE_OTHER): Payer: BLUE CROSS/BLUE SHIELD

## 2016-06-26 VITALS — BP 121/75 | HR 62

## 2016-06-26 DIAGNOSIS — I10 Essential (primary) hypertension: Secondary | ICD-10-CM

## 2016-06-26 DIAGNOSIS — D72829 Elevated white blood cell count, unspecified: Secondary | ICD-10-CM

## 2016-06-26 LAB — CBC WITH DIFFERENTIAL/PLATELET
BASOS PCT: 0.3 % (ref 0.0–3.0)
Basophils Absolute: 0 10*3/uL (ref 0.0–0.1)
EOS PCT: 6.6 % — AB (ref 0.0–5.0)
Eosinophils Absolute: 0.7 10*3/uL (ref 0.0–0.7)
HCT: 39.5 % (ref 39.0–52.0)
Hemoglobin: 13 g/dL (ref 13.0–17.0)
LYMPHS ABS: 2.9 10*3/uL (ref 0.7–4.0)
Lymphocytes Relative: 29 % (ref 12.0–46.0)
MCHC: 33 g/dL (ref 30.0–36.0)
MCV: 79.1 fl (ref 78.0–100.0)
MONOS PCT: 6.1 % (ref 3.0–12.0)
Monocytes Absolute: 0.6 10*3/uL (ref 0.1–1.0)
NEUTROS ABS: 5.8 10*3/uL (ref 1.4–7.7)
NEUTROS PCT: 58 % (ref 43.0–77.0)
Platelets: 357 10*3/uL (ref 150.0–400.0)
RBC: 4.99 Mil/uL (ref 4.22–5.81)
RDW: 15.8 % — AB (ref 11.5–15.5)
WBC: 10 10*3/uL (ref 4.0–10.5)

## 2016-06-26 NOTE — Progress Notes (Signed)
Pre visit review using our clinic tool,if applicable. No additional management support is needed unless otherwise documented below in the visit note.   Patient in for BP check.  BP = 121/75 P=73  LPN blood pressure check note reviewed. Agree with documention and plan.  Danise EdgeBLYTH, STACEY, MD

## 2016-07-02 ENCOUNTER — Telehealth: Payer: Self-pay

## 2016-07-05 NOTE — Telephone Encounter (Signed)
Called patient left message for patient to return call to see if he has gone to ED or UC regarding his symptoms,or to see if he wanted to schedule appointment for this afternoon or tomorrow..Marland Kitchen

## 2016-07-05 NOTE — Telephone Encounter (Signed)
I agree, it may be musculoskeletal but someone has to evaluate

## 2016-07-05 NOTE — Telephone Encounter (Signed)
Patient called states he has been doing some moving and now his chest hurts on one side and his eye feels like it's "about to pop out of his head" wanted to know if he needed to go to Urgent Care. Advised patient that if he had chest pain he definately needed to be evaluated. Patient agreed to go.

## 2016-07-06 ENCOUNTER — Encounter: Payer: Self-pay | Admitting: *Deleted

## 2016-07-06 NOTE — Telephone Encounter (Signed)
Noted, thank you

## 2016-07-06 NOTE — Telephone Encounter (Signed)
Patient returned call stating he feels fine and he may have pulled a muscle. Patient declined appointment stating he has a lot of "things" to do today and if symptoms reoccur he will go to the hospital. Patient did schedule appointment with PCP for Friday 07/13/16.

## 2016-07-06 NOTE — Telephone Encounter (Signed)
Called patient and left message to return call. MyChart message sent to patient as well. Per chart review, he has not been seen at any [facility] facility.

## 2016-07-06 NOTE — Telephone Encounter (Signed)
Patient returning call.

## 2016-07-13 ENCOUNTER — Encounter: Payer: Self-pay | Admitting: Family Medicine

## 2016-07-13 ENCOUNTER — Ambulatory Visit (INDEPENDENT_AMBULATORY_CARE_PROVIDER_SITE_OTHER): Payer: BLUE CROSS/BLUE SHIELD | Admitting: Family Medicine

## 2016-07-13 DIAGNOSIS — E6609 Other obesity due to excess calories: Secondary | ICD-10-CM | POA: Diagnosis not present

## 2016-07-13 DIAGNOSIS — T148XXA Other injury of unspecified body region, initial encounter: Secondary | ICD-10-CM | POA: Diagnosis not present

## 2016-07-13 DIAGNOSIS — I10 Essential (primary) hypertension: Secondary | ICD-10-CM | POA: Diagnosis not present

## 2016-07-13 MED ORDER — ROSUVASTATIN CALCIUM 20 MG PO TABS
20.0000 mg | ORAL_TABLET | Freq: Every day | ORAL | 5 refills | Status: DC
Start: 2016-07-13 — End: 2017-11-12

## 2016-07-13 MED ORDER — LOSARTAN POTASSIUM-HCTZ 100-25 MG PO TABS: 1.0000 | ORAL_TABLET | Freq: Every day | ORAL | 5 refills | Status: DC

## 2016-07-13 MED ORDER — CARISOPRODOL 350 MG PO TABS: 350.0000 mg | ORAL_TABLET | Freq: Three times a day (TID) | ORAL | 1 refills | Status: DC | PRN

## 2016-07-13 MED ORDER — FENOFIBRATE 145 MG PO TABS: 145.0000 mg | ORAL_TABLET | Freq: Every day | ORAL | 5 refills | Status: DC

## 2016-07-13 MED ORDER — AMLODIPINE BESYLATE 10 MG PO TABS: 10.0000 mg | ORAL_TABLET | Freq: Every day | ORAL | 5 refills | Status: DC

## 2016-07-13 NOTE — Patient Instructions (Signed)
Try Aspercreme aspirin and the lidocaine versions apply twice daily  Costochondritis Costochondritis is swelling and irritation (inflammation) of the tissue (cartilage) that connects your ribs to your breastbone (sternum). This causes pain in the front of your chest. The pain usually starts gradually and involves more than one rib. What are the causes? The exact cause of this condition is not always known. It results from stress on the cartilage where your ribs attach to your sternum. The cause of this stress could be:  Chest injury (trauma).  Exercise or activity, such as lifting.  Severe coughing. What increases the risk? You may be at higher risk for this condition if you:  Are male.  Are 3030?43 years old.  Recently started a new exercise or work activity.  Have low levels of vitamin D.  Have a condition that makes you cough frequently. What are the signs or symptoms? The main symptom of this condition is chest pain. The pain:  Usually starts gradually and can be sharp or dull.  Gets worse with deep breathing, coughing, or exercise.  Gets better with rest.  May be worse when you press on the sternum-rib connection (tenderness). How is this diagnosed? This condition is diagnosed based on your symptoms, medical history, and a physical exam. Your health care provider will check for tenderness when pressing on your sternum. This is the most important finding. You may also have tests to rule out other causes of chest pain. These may include:  A chest X-ray to check for lung problems.  An electrocardiogram (ECG) to see if you have a heart problem that could be causing the pain.  An imaging scan to rule out a chest or rib fracture. How is this treated? This condition usually goes away on its own over time. Your health care provider may prescribe an NSAID to reduce pain and inflammation. Your health care provider may also suggest that you:  Rest and avoid activities that make  pain worse.  Apply heat or cold to the area to reduce pain and inflammation.  Do exercises to stretch your chest muscles. If these treatments do not help, your health care provider may inject a numbing medicine at the sternum-rib connection to help relieve the pain. Follow these instructions at home:  Avoid activities that make pain worse. This includes any activities that use chest, abdominal, and side muscles.  If directed, put ice on the painful area:  Put ice in a plastic bag.  Place a towel between your skin and the bag.  Leave the ice on for 20 minutes, 2-3 times a day.  If directed, apply heat to the affected area as often as told by your health care provider. Use the heat source that your health care provider recommends, such as a moist heat pack or a heating pad.  Place a towel between your skin and the heat source.  Leave the heat on for 20-30 minutes.  Remove the heat if your skin turns bright red. This is especially important if you are unable to feel pain, heat, or cold. You may have a greater risk of getting burned.  Take over-the-counter and prescription medicines only as told by your health care provider.  Return to your normal activities as told by your health care provider. Ask your health care provider what activities are safe for you.  Keep all follow-up visits as told by your health care provider. This is important. Contact a health care provider if:  You have chills or a fever.  Your pain does not go away or it gets worse.  You have a cough that does not go away (is persistent). Get help right away if:  You have shortness of breath. This information is not intended to replace advice given to you by your health care provider. Make sure you discuss any questions you have with your health care provider. Document Released: 04/11/2005 Document Revised: 01/20/2016 Document Reviewed: 10/26/2015 Elsevier Interactive Patient Education  2017 [employer]

## 2016-07-13 NOTE — Progress Notes (Signed)
Patient ID: Jason ClicheGregory Waltermire, male   DOB: 02/10/1971, 45 y.o.   MRN: [id number]

## 2016-07-13 NOTE — Progress Notes (Signed)
Pre visit review using our clinic review tool, if applicable. No additional management support is needed unless otherwise documented below in the visit note. 

## 2016-07-13 NOTE — Progress Notes (Signed)
Subjective:    Patient ID: Jason Harper, male    DOB: 01/30/1971, 45 y.o.   MRN: [id number]  Chief Complaint  Patient presents with  . Possible pulled muscle    HPI Patient is in today for a possible pulled muscle.  May have been injured at the Dentist Office on 06/29/2016. Describes pain in anterior lower chest wall. Hurts with movement, straining, coughing. Keeps him from getting comfortable even at night. Denies palp/SOB/HA/congestion/fevers/GI or GU c/o. Taking meds as prescribed  Past Medical History:  Diagnosis Date  . Hearing loss in right ear 03/22/2015   Secondary to Scarlet Fever as child  . High cholesterol   . Hypertension   . Obesity 03/22/2015    No past surgical history on file.  Family History  Problem Relation Age of Onset  . Hyperlipidemia Mother   . Hypertension Mother   . Arthritis Mother   . Hypertension Father   . Hyperlipidemia Father   . Heart disease Father     MI  . Hypertension Brother   . Heart disease Brother     MI    Social History   Social History  . Marital status: Legally Separated    Spouse name: N/A  . Number of children: N/A  . Years of education: N/A   Occupational History  . Not on file.   Social History Main Topics  . Smoking status: Never Smoker  . Smokeless tobacco: Never Used  . Alcohol use No  . Drug use: No  . 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 Not on file   Social History Narrative  . No narrative on file    Outpatient Medications Prior to Visit  Medication Sig Dispense Refill  . aspirin EC 81 MG tablet Take 81 mg by mouth daily.    Marland Kitchen. escitalopram (LEXAPRO) 10 MG tablet 1/2 tab po daily x 7 days then increase to 1 tab po daily 30 tablet 3  . meloxicam (MOBIC) 15 MG tablet Take 1 tablet (15 mg total) by mouth daily. 30 tablet 3  . nitroGLYCERIN (NITRODUR - DOSED IN MG/24 HR) 0.2 mg/hr patch Apply 1/4th  patch to affected ankle, change daily 30 patch 1  . Omega-3 Fatty Acids (FISH OIL OMEGA-3 PO) Take 1 tablet by mouth daily.    Marland Kitchen. omeprazole (PRILOSEC) 40 MG capsule Take 1 capsule (40 mg total) by mouth daily. 30 capsule 5  . traMADol (ULTRAM) 50 MG tablet Take 1 tablet (50 mg total) by mouth every 12 (twelve) hours as needed for moderate pain or severe pain. 60 tablet 0  . amLODipine (NORVASC) 10 MG tablet Take 1 tablet (10 mg total) by mouth daily. 30 tablet 5  . fenofibrate (TRICOR) 145 MG tablet Take 1 tablet (145 mg total) by mouth daily. 30 tablet 5  . losartan-hydrochlorothiazide (HYZAAR) 100-25 MG tablet Take 1 tablet by mouth daily. 30 tablet 5  . rosuvastatin (CRESTOR) 20 MG tablet   4  . tiZANidine (ZANAFLEX) 4 MG tablet Take 1 tablet (4 mg total) by mouth every 8 (eight) hours as needed for muscle spasms. 90 tablet 3   No facility-administered medications prior to visit.     No Known Allergies  Review of Systems  Constitutional: Negative for fever.  Eyes: Negative for blurred vision.  Respiratory: Negative for cough and shortness of breath.   Cardiovascular: Negative for chest pain.  Gastrointestinal: Negative for vomiting.  Musculoskeletal: Positive for myalgias. Negative for back pain.       Possible Pulled Muscle.  Skin: Negative for rash.  Neurological: Negative for loss of consciousness and headaches.       Objective:    Physical Exam  Constitutional: He is oriented to person, place, and time. He appears well-developed and well-nourished. No distress.  HENT:  Head: Normocephalic and atraumatic.  Eyes: Conjunctivae are normal.  Neck: Normal range of motion. No thyromegaly present.  Cardiovascular: Normal rate and regular rhythm.   Pulmonary/Chest: Effort normal and breath sounds normal. He has no wheezes.  Abdominal: Soft. Bowel sounds are normal. There is no tenderness.  Musculoskeletal: He exhibits no edema or deformity.  Neurological: He is alert and  oriented to person, place, and time.  Skin: Skin is warm and dry. He is not diaphoretic.  Psychiatric: He has a normal mood and affect.    BP 130/80 (BP Location: Right Arm, Patient Position: Sitting, Cuff Size: Large)   Pulse 83   Temp 97.7 F (36.5 C) (Oral)   Wt 240 lb 6.4 oz (109 kg)   SpO2 96% Comment: RA  BMI 40.00 kg/m  Wt Readings from Last 3 Encounters:  07/13/16 240 lb 6.4 oz (109 kg)  06/12/16 232 lb (105.2 kg)  05/24/16 232 lb 6 oz (105.4 kg)     Lab Results  Component Value Date   WBC 10.0 06/26/2016   HGB 13.0 06/26/2016   HCT 39.5 06/26/2016   PLT 357.0 06/26/2016   GLUCOSE 90 05/24/2016   CHOL 222 (H) 05/24/2016   TRIG 150.0 (H) 05/24/2016   HDL 32.00 (L) 05/24/2016   LDLCALC 160 (H) 05/24/2016   ALT 25 05/24/2016   AST 23 05/24/2016   NA 138 05/24/2016   K 3.6 05/24/2016   CL 102 05/24/2016   CREATININE 0.96 05/24/2016   BUN 11 05/24/2016   CO2 28 05/24/2016   TSH 2.19 05/24/2016   PSA 0.9 07/26/2014   HGBA1C 6.0 05/24/2016    Lab Results  Component Value Date   TSH 2.19 05/24/2016   Lab Results  Component Value Date   WBC 10.0 06/26/2016   HGB 13.0 06/26/2016   HCT 39.5 06/26/2016   MCV 79.1 06/26/2016   PLT 357.0 06/26/2016   Lab Results  Component Value Date   NA 138 05/24/2016   K 3.6 05/24/2016   CO2 28 05/24/2016   GLUCOSE 90 05/24/2016   BUN 11 05/24/2016   CREATININE 0.96 05/24/2016   BILITOT 0.4 05/24/2016   ALKPHOS 69 05/24/2016   AST 23 05/24/2016   ALT 25 05/24/2016   PROT 7.8 05/24/2016   ALBUMIN 4.3 05/24/2016   CALCIUM 9.2 05/24/2016   GFR 90.71 05/24/2016   Lab Results  Component Value Date   CHOL 222 (H) 05/24/2016   Lab Results  Component Value Date   HDL 32.00 (L) 05/24/2016   Lab Results  Component Value Date   LDLCALC 160 (H) 05/24/2016   Lab Results  Component Value Date   TRIG 150.0 (H) 05/24/2016   Lab Results  Component Value Date   CHOLHDL 7 05/24/2016   Lab Results  Component  Value Date   HGBA1C 6.0 05/24/2016       Assessment & Plan:   Problem List Items Addressed This Visit    Hypertension    Well controlled, no changes to meds. Encouraged heart healthy diet such as the DASH diet and exercise as tolerated.  Relevant Medications   amLODipine (NORVASC) 10 MG tablet   fenofibrate (TRICOR) 145 MG tablet   rosuvastatin (CRESTOR) 20 MG tablet   losartan-hydrochlorothiazide (HYZAAR) 100-25 MG tablet   Obesity    Encouraged DASH diet, decrease po intake and increase exercise as tolerated. Needs 7-8 hours of sleep nightly. Avoid trans fats, eat small, frequent meals every 4-5 hours with lean proteins, complex carbs and healthy fats. Minimize simple carbs      Muscle strain    Encouraged moist heat and gentle stretching as tolerated. May try NSAIDs and prescription meds as directed and report if symptoms worsen or seek immediate care. Soma prn and topical treatments         I have discontinued Mr. Kerstetter tiZANidine and rosuvastatin. I am also having him start on carisoprodol and rosuvastatin. Additionally, I am having him maintain his aspirin EC, omeprazole, escitalopram, Omega-3 Fatty Acids (FISH OIL OMEGA-3 PO), meloxicam, traMADol, nitroGLYCERIN, amLODipine, fenofibrate, and losartan-hydrochlorothiazide.  Meds ordered this encounter  Medications  . carisoprodol (SOMA) 350 MG tablet    Sig: Take 1 tablet (350 mg total) by mouth 3 (three) times daily as needed for muscle spasms.    Dispense:  30 tablet    Refill:  1  . amLODipine (NORVASC) 10 MG tablet    Sig: Take 1 tablet (10 mg total) by mouth daily.    Dispense:  30 tablet    Refill:  5  . fenofibrate (TRICOR) 145 MG tablet    Sig: Take 1 tablet (145 mg total) by mouth daily.    Dispense:  30 tablet    Refill:  5  . rosuvastatin (CRESTOR) 20 MG tablet    Sig: Take 1 tablet (20 mg total) by mouth daily.    Dispense:  30 tablet    Refill:  5  . losartan-hydrochlorothiazide (HYZAAR) 100-25  MG tablet    Sig: Take 1 tablet by mouth daily.    Dispense:  30 tablet    Refill:  5   CMA served as scribe during this visit. History, Physical and Plan performed by medical provider. Documentation and orders reviewed and attested to.  Danise Edge, MD

## 2016-07-19 DIAGNOSIS — T148XXA Other injury of unspecified body region, initial encounter: Secondary | ICD-10-CM | POA: Insufficient documentation

## 2016-07-19 NOTE — Assessment & Plan Note (Signed)
Encouraged moist heat and gentle stretching as tolerated. May try NSAIDs and prescription meds as directed and report if symptoms worsen or seek immediate care. Soma prn and topical treatments

## 2016-07-19 NOTE — Assessment & Plan Note (Signed)
Encouraged DASH diet, decrease po intake and increase exercise as tolerated. Needs 7-8 hours of sleep nightly. Avoid trans fats, eat small, frequent meals every 4-5 hours with lean proteins, complex carbs and healthy fats. Minimize simple carbs 

## 2016-07-19 NOTE — Assessment & Plan Note (Signed)
Well controlled, no changes to meds. Encouraged heart healthy diet such as the DASH diet and exercise as tolerated.  °

## 2016-07-21 DIAGNOSIS — J018 Other acute sinusitis: Secondary | ICD-10-CM | POA: Diagnosis not present

## 2016-07-21 DIAGNOSIS — T7840XA Allergy, unspecified, initial encounter: Secondary | ICD-10-CM | POA: Diagnosis not present

## 2016-07-24 ENCOUNTER — Ambulatory Visit: Payer: BLUE CROSS/BLUE SHIELD | Admitting: Family Medicine

## 2016-08-24 ENCOUNTER — Encounter: Payer: Self-pay | Admitting: Family Medicine

## 2016-08-24 ENCOUNTER — Ambulatory Visit (INDEPENDENT_AMBULATORY_CARE_PROVIDER_SITE_OTHER): Payer: BLUE CROSS/BLUE SHIELD | Admitting: Family Medicine

## 2016-08-24 DIAGNOSIS — M25572 Pain in left ankle and joints of left foot: Secondary | ICD-10-CM

## 2016-08-24 MED ORDER — MELOXICAM 15 MG PO TABS: 15.0000 mg | ORAL_TABLET | Freq: Every day | ORAL | 3 refills | Status: DC

## 2016-08-24 MED FILL — MELOXICAM 15 MG TABLET: 15 | 30 days supply | Qty: 30 | Fill #0

## 2016-08-24 NOTE — Patient Instructions (Signed)
You have plantar fasciitis and posterior tibialis tendinitis. Take meloxicam as you have been for pain and inflammation. If meloxicam isn't helping enough you can stop this and try aleve 2 tabs twice a day with food instead. Continue the home exercises, arch binders, icing. Pick up new inserts as you're going to do. Increase the nitro patches to 1/2 patch and change these daily. Consider custom orthotics, physical therapy, surgical referral if you're not improving. Follow up with me in 6 weeks or as needed.

## 2016-08-27 NOTE — Assessment & Plan Note (Signed)
2/2 plantar fasciitis and posterior tibialis tendinitis.  Extensor tendinitis has improved.  We discussed options - will continue meloxicam, home exercises, arch binders, icing.  Plans to get new OTC inserts today.  Increase nitro to 1/2 patch daily.  Consider custom orthotics, physical therapy if not improving.  F/u in 6 weeks.

## 2016-08-27 NOTE — Progress Notes (Signed)
PCP and consultation requested by: Danise EdgeStacey Blyth, MD  Subjective:   HPI: Patient is a 46 y.o. male here for left foot pain.  06/12/16: Patient reports he's had about 2 years of left foot pain. Started at that time when he fell on the ice, twisted this foot. Had pain bottom of foot and medial ankle/foot that has persisted since that time. Pain is 6/10, sharp. Worse with prolonged standing. Has tried several things: boot, medrol dose pack, meloxicam, plantar fascia splint, home exercise program, plantar fascia injection, inserts, frozen water bottle. Has since also developed pain dorsal foot/ankle. Has been icing, elevating, taking tramadol. No skin changes, numbness.  08/24/16: Patient reports his left foot feels about the same. Pain level 4/10 at rest, up to 8/10 and sharp plantar and medial left foot. Using nitro patches, doing home exercises. Pain worse at work and by end of the day. No skin changes, numbness.  Past Medical History:  Diagnosis Date  . Hearing loss in right ear 03/22/2015   Secondary to Scarlet Fever as child  . High cholesterol   . Hypertension   . Obesity 03/22/2015    Current Outpatient Prescriptions on File Prior to Visit  Medication Sig Dispense Refill  . amLODipine (NORVASC) 10 MG tablet Take 1 tablet (10 mg total) by mouth daily. 30 tablet 5  . aspirin EC 81 MG tablet Take 81 mg by mouth daily.    . carisoprodol (SOMA) 350 MG tablet Take 1 tablet (350 mg total) by mouth 3 (three) times daily as needed for muscle spasms. 30 tablet 1  . escitalopram (LEXAPRO) 10 MG tablet 1/2 tab po daily x 7 days then increase to 1 tab po daily 30 tablet 3  . fenofibrate (TRICOR) 145 MG tablet Take 1 tablet (145 mg total) by mouth daily. 30 tablet 5  . losartan-hydrochlorothiazide (HYZAAR) 100-25 MG tablet Take 1 tablet by mouth daily. 30 tablet 5  . nitroGLYCERIN (NITRODUR - DOSED IN MG/24 HR) 0.2 mg/hr patch Apply 1/4th patch to affected ankle, change daily 30 patch 1  .  Omega-3 Fatty Acids (FISH OIL OMEGA-3 PO) Take 1 tablet by mouth daily.    Marland Kitchen. omeprazole (PRILOSEC) 40 MG capsule Take 1 capsule (40 mg total) by mouth daily. 30 capsule 5  . rosuvastatin (CRESTOR) 20 MG tablet Take 1 tablet (20 mg total) by mouth daily. 30 tablet 5  . traMADol (ULTRAM) 50 MG tablet Take 1 tablet (50 mg total) by mouth every 12 (twelve) hours as needed for moderate pain or severe pain. 60 tablet 0   No current facility-administered medications on file prior to visit.     No past surgical history on file.  Allergies  Allergen Reactions  . Penicillins     Social History   Social History  . Marital status: Legally Separated    Spouse name: N/A  . Number of children: N/A  . Years of education: N/A   Occupational History  . Not on file.   Social History Main Topics  . Smoking status: Never Smoker  . Smokeless tobacco: Never Used  . Alcohol use No  . Drug use: No  . 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 Not on file   Social History Narrative  . No narrative on file    Family History  Problem Relation Age of Onset  . Hyperlipidemia Mother   . Hypertension Mother   .  Arthritis Mother   . Hypertension Father   . Hyperlipidemia Father   . Heart disease Father     MI  . Hypertension Brother   . Heart disease Brother     MI    BP (!) 147/85   Pulse 66   Ht 5\' 5"  (1.651 m)   Wt 232 lb (105.2 kg)   BMI 38.61 kg/m   Review of Systems: See HPI above.     Objective:  Physical Exam:  Gen: NAD, comfortable in exam room  Left foot/ankle: No gross deformity, swelling, ecchymoses.  Mild pronation. FROM with pain on internal rotation. TTP within plantar fascia, course of posterior tibialis tendon. Negative ant drawer and talar tilt.   Negative syndesmotic compression. Negative calcaneal squeeze. Thompsons test negative. NV intact distally.    Assessment & Plan:  1. Left foot/ankle pain - 2/2 plantar fasciitis and posterior tibialis tendinitis.  Extensor tendinitis has improved.  We discussed options - will continue meloxicam, home exercises, arch binders, icing.  Plans to get new OTC inserts today.  Increase nitro to 1/2 patch daily.  Consider custom orthotics, physical therapy if not improving.  F/u in 6 weeks.

## 2016-11-12 ENCOUNTER — Encounter: Payer: Self-pay | Admitting: Family Medicine

## 2016-11-12 ENCOUNTER — Ambulatory Visit (INDEPENDENT_AMBULATORY_CARE_PROVIDER_SITE_OTHER): Payer: BLUE CROSS/BLUE SHIELD | Admitting: Family Medicine

## 2016-11-12 VITALS — BP 118/80 | HR 72 | Temp 98.0°F | Resp 18 | Ht 65.0 in | Wt 233.2 lb

## 2016-11-12 DIAGNOSIS — I10 Essential (primary) hypertension: Secondary | ICD-10-CM

## 2016-11-12 DIAGNOSIS — R739 Hyperglycemia, unspecified: Secondary | ICD-10-CM

## 2016-11-12 DIAGNOSIS — M25572 Pain in left ankle and joints of left foot: Secondary | ICD-10-CM

## 2016-11-12 DIAGNOSIS — E6609 Other obesity due to excess calories: Secondary | ICD-10-CM | POA: Diagnosis not present

## 2016-11-12 DIAGNOSIS — Z0000 Encounter for general adult medical examination without abnormal findings: Secondary | ICD-10-CM | POA: Diagnosis not present

## 2016-11-12 DIAGNOSIS — R519 Headache, unspecified: Secondary | ICD-10-CM

## 2016-11-12 DIAGNOSIS — R51 Headache: Secondary | ICD-10-CM | POA: Diagnosis not present

## 2016-11-12 DIAGNOSIS — E785 Hyperlipidemia, unspecified: Secondary | ICD-10-CM

## 2016-11-12 HISTORY — DX: Encounter for general adult medical examination without abnormal findings: Z00.00

## 2016-11-12 HISTORY — DX: Headache, unspecified: R51.9

## 2016-11-12 LAB — LIPID PANEL
Cholesterol: 153 mg/dL (ref 0–200)
HDL: 30.1 mg/dL — AB (ref >39.00)
LDL CALC: 96 mg/dL (ref 0–99)
NONHDL: 123.25
TRIGLYCERIDES: 134 mg/dL (ref 0.0–149.0)
Total CHOL/HDL Ratio: 5
VLDL: 26.8 mg/dL (ref 0.0–40.0)

## 2016-11-12 LAB — CBC
HCT: 41.8 % (ref 39.0–52.0)
HEMOGLOBIN: 13.9 g/dL (ref 13.0–17.0)
MCHC: 33.3 g/dL (ref 30.0–36.0)
MCV: 81 fl (ref 78.0–100.0)
PLATELETS: 381 10*3/uL (ref 150.0–400.0)
RBC: 5.16 Mil/uL (ref 4.22–5.81)
RDW: 14.1 % (ref 11.5–15.5)
WBC: 10.1 10*3/uL (ref 4.0–10.5)

## 2016-11-12 LAB — COMPREHENSIVE METABOLIC PANEL
ALT: 31 U/L (ref 0–53)
AST: 29 U/L (ref 0–37)
Albumin: 4.5 g/dL (ref 3.5–5.2)
Alkaline Phosphatase: 67 U/L (ref 39–117)
BUN: 9 mg/dL (ref 6–23)
CALCIUM: 9.4 mg/dL (ref 8.4–10.5)
CHLORIDE: 101 meq/L (ref 96–112)
CO2: 27 meq/L (ref 19–32)
Creatinine, Ser: 0.82 mg/dL (ref 0.40–1.50)
GFR: 108.57 mL/min (ref >60.00)
GLUCOSE: 76 mg/dL (ref 70–99)
Potassium: 3 mEq/L — ABNORMAL LOW (ref 3.5–5.1)
Sodium: 136 mEq/L (ref 135–145)
Total Bilirubin: 0.6 mg/dL (ref 0.2–1.2)
Total Protein: 7.6 g/dL (ref 6.0–8.3)

## 2016-11-12 LAB — TSH: TSH: 1.99 u[IU]/mL (ref 0.35–4.50)

## 2016-11-12 LAB — HEMOGLOBIN A1C: HEMOGLOBIN A1C: 6 % (ref 4.6–6.5)

## 2016-11-12 MED ORDER — TRAMADOL HCL 50 MG PO TABS: 50.0000 mg | ORAL_TABLET | Freq: Two times a day (BID) | ORAL | 0 refills | Status: DC | PRN

## 2016-11-12 NOTE — Assessment & Plan Note (Signed)
Well controlled, no changes to meds. Encouraged heart healthy diet such as the DASH diet and exercise as tolerated.  °

## 2016-11-12 NOTE — Assessment & Plan Note (Signed)
hgba1c acceptable, minimize simple carbs. Increase exercise as tolerated.  

## 2016-11-12 NOTE — Assessment & Plan Note (Signed)
Injured left ankle in April 2018, slowly improving. Was seen by sports med, Dr Pearletha Forge and has been given exercises to strengthen. Will return to sports med as neede

## 2016-11-12 NOTE — Patient Instructions (Signed)
Preventive Care 18-39 Years, Male Preventive care refers to lifestyle choices and visits with your health care provider that can promote health and wellness. What does preventive care include?  A yearly physical exam. This is also called an annual well check.  Dental exams once or twice a year.  Routine eye exams. Ask your health care provider how often you should have your eyes checked.  Personal lifestyle choices, including:  Daily care of your teeth and gums.  Regular physical activity.  Eating a healthy diet.  Avoiding tobacco and drug use.  Limiting alcohol use.  Practicing safe sex. What happens during an annual well check? The services and screenings done by your health care provider during your annual well check will depend on your age, overall health, lifestyle risk factors, and family history of disease. Counseling  Your health care provider may ask you questions about your:  Alcohol use.  Tobacco use.  Drug use.  Emotional well-being.  Home and relationship well-being.  Sexual activity.  Eating habits.  Work and work Statistician. Screening  You may have the following tests or measurements:  Height, weight, and BMI.  Blood pressure.  Lipid and cholesterol levels. These may be checked every 5 years starting at age 73.  Diabetes screening. This is done by checking your blood sugar (glucose) after you have not eaten for a while (fasting).  Skin check.  Hepatitis C blood test.  Hepatitis B blood test.  Sexually transmitted disease (STD) testing. Discuss your test results, treatment options, and if necessary, the need for more tests with your health care provider. Vaccines  Your health care provider may recommend certain vaccines, such as:  Influenza vaccine. This is recommended every year.  Tetanus, diphtheria, and acellular pertussis (Tdap, Td) vaccine. You may need a Td booster every 10 years.  Varicella vaccine. You may need this if you  have not been vaccinated.  HPV vaccine. If you are 72 or younger, you may need three doses over 6 months.  Measles, mumps, and rubella (MMR) vaccine. You may need at least one dose of MMR.You may also need a second dose.  Pneumococcal 13-valent conjugate (PCV13) vaccine. You may need this if you have certain conditions and have not been vaccinated.  Pneumococcal polysaccharide (PPSV23) vaccine. You may need one or two doses if you smoke cigarettes or if you have certain conditions.  Meningococcal vaccine. One dose is recommended if you are age 56-21 years and a first-year college student living in a residence hall, or if you have one of several medical conditions. You may also need additional booster doses.  Hepatitis A vaccine. You may need this if you have certain conditions or if you travel or work in places where you may be exposed to hepatitis A.  Hepatitis B vaccine. You may need this if you have certain conditions or if you travel or work in places where you may be exposed to hepatitis B.  Haemophilus influenzae type b (Hib) vaccine. You may need this if you have certain risk factors. Talk to your health care provider about which screenings and vaccines you need and how often you need them. This information is not intended to replace advice given to you by your health care provider. Make sure you discuss any questions you have with your health care provider. Document Released: 08/28/2001 Document Revised: 03/21/2016 Document Reviewed: 05/03/2015 Elsevier Interactive Patient Education  2017 [employer].

## 2016-11-12 NOTE — Assessment & Plan Note (Signed)
Encouraged DASH diet, decrease po intake and increase exercise as tolerated. Needs 7-8 hours of sleep nightly. Avoid trans fats, eat small, frequent meals every 4-5 hours with lean proteins, complex carbs and healthy fats. Minimize simple carbs, bariatric referral 

## 2016-11-12 NOTE — Assessment & Plan Note (Signed)
Patient encouraged to maintain heart healthy diet, regular exercise, adequate sleep. Consider daily probiotics. Take medications as prescribed 

## 2016-11-12 NOTE — Progress Notes (Signed)
Pre visit review using our clinic review tool, if applicable. No additional management support is needed unless otherwise documented below in the visit note. 

## 2016-11-12 NOTE — Progress Notes (Signed)
Subjective:  I acted as a Neurosurgeon for Dr. Abner Greenspan. [place], [place]   Patient ID: Jason Harper, male    DOB: 30-Oct-1970, 46 y.o.   MRN: [id number]  Chief Complaint  Patient presents with  . Annual Exam    HPI  Patient is in today for an annual exam. Patient in following up on hypertension, and other medical conditions. Patient c/o recently having headaches. States he takes tylenol and it helps subsides. He twisted his ankle earlier this month, walking. He is following with Dr. Pearletha Forge. He feels well today. No recent febrile illness or hospitalization. Denies CP/palp/SOB/HA/congestion/fevers/GI or GU c/o. Taking meds as prescribed  Patient Care Team: Bradd Canary, MD as PCP - General (Family Medicine) Max Maud Deed, DPM as Consulting Physician (Podiatry) Lenda Kelp, MD as Consulting Physician (Sports Medicine)   Past Medical History:  Diagnosis Date  . Headache 11/12/2016  . Hearing loss in right ear 03/22/2015   Secondary to Scarlet Fever as child  . High cholesterol   . Hypertension   . Obesity 03/22/2015  . Preventative health care 11/12/2016    History reviewed. No pertinent surgical history.  Family History  Problem Relation Age of Onset  . Hyperlipidemia Mother   . Hypertension Mother   . Arthritis Mother   . Hypertension Father   . Hyperlipidemia Father   . Heart disease Father     MI  . Hypertension Brother   . Heart disease Brother     MI    Social History   Social History  . Marital status: Legally Separated    Spouse name: N/A  . Number of children: N/A  . Years of education: N/A   Occupational History  . Not on file.   Social History Main Topics  . Smoking status: Never Smoker  . Smokeless tobacco: Never Used  . Alcohol use 0.6 oz/week    1 Cans of beer per week  . Drug use: No  . 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 Not on file   Social  History Narrative  . No narrative on file    Outpatient Medications Prior to Visit  Medication Sig Dispense Refill  . amLODipine (NORVASC) 10 MG tablet Take 1 tablet (10 mg total) by mouth daily. 30 tablet 5  . aspirin EC 81 MG tablet Take 81 mg by mouth daily.    . carisoprodol (SOMA) 350 MG tablet Take 1 tablet (350 mg total) by mouth 3 (three) times daily as needed for muscle spasms. 30 tablet 1  . escitalopram (LEXAPRO) 10 MG tablet 1/2 tab po daily x 7 days then increase to 1 tab po daily 30 tablet 3  . fenofibrate (TRICOR) 145 MG tablet Take 1 tablet (145 mg total) by mouth daily. 30 tablet 5  . losartan-hydrochlorothiazide (HYZAAR) 100-25 MG tablet Take 1 tablet by mouth daily. 30 tablet 5  . meloxicam (MOBIC) 15 MG tablet Take 1 tablet (15 mg total) by mouth daily. 30 tablet 3  . nitroGLYCERIN (NITRODUR - DOSED IN MG/24 HR) 0.2 mg/hr patch Apply 1/4th patch to affected ankle, change daily 30 patch 1  . Omega-3 Fatty Acids (FISH OIL OMEGA-3 PO) Take 1 tablet by mouth daily.    Marland Kitchen omeprazole (PRILOSEC) 40 MG capsule Take 1 capsule (40 mg total) by mouth daily. 30 capsule 5  . rosuvastatin (CRESTOR) 20 MG tablet Take 1  tablet (20 mg total) by mouth daily. 30 tablet 5  . traMADol (ULTRAM) 50 MG tablet Take 1 tablet (50 mg total) by mouth every 12 (twelve) hours as needed for moderate pain or severe pain. 60 tablet 0   No facility-administered medications prior to visit.     Allergies  Allergen Reactions  . Penicillins Swelling    Review of Systems  Constitutional: Negative for fever and malaise/fatigue.  HENT: Negative for congestion.   Eyes: Negative for blurred vision.  Respiratory: Negative for cough and shortness of breath.   Cardiovascular: Negative for chest pain, palpitations and leg swelling.  Gastrointestinal: Negative for vomiting.  Musculoskeletal: Positive for joint pain. Negative for back pain.  Skin: Negative for rash.  Neurological: Negative for loss of  consciousness and headaches.       Objective:    Physical Exam  Constitutional: He is oriented to person, place, and time. He appears well-developed and well-nourished. No distress.  HENT:  Head: Normocephalic and atraumatic.  Eyes: Conjunctivae are normal.  Neck: Normal range of motion. No thyromegaly present.  Cardiovascular: Normal rate and regular rhythm.   Pulmonary/Chest: Effort normal and breath sounds normal. He has no wheezes.  Abdominal: Soft. Bowel sounds are normal. There is no tenderness.  Musculoskeletal: Normal range of motion. He exhibits no edema or deformity.  Neurological: He is alert and oriented to person, place, and time.  Skin: Skin is warm and dry. He is not diaphoretic.  Psychiatric: He has a normal mood and affect.    BP 118/80 (BP Location: Left Arm, Patient Position: Sitting, Cuff Size: Normal)   Pulse 72   Temp 98 F (36.7 C) (Oral)   Resp 18   Ht  (1.651 m)   Wt 233 lb 3.2 oz (105.8 kg)   SpO2 98%   BMI 38.81 kg/m  Wt Readings from Last 3 Encounters:  11/12/16 233 lb 3.2 oz (105.8 kg)  08/24/16 232 lb (105.2 kg)  07/13/16 240 lb 6.4 oz (109 kg)   BP Readings from Last 3 Encounters:  11/12/16 118/80  08/24/16 (!) 147/85  07/13/16 130/80     Immunization History  Administered Date(s) Administered  . Influenza,inj,Quad PF,36+ Mos 04/19/2014, 06/17/2015    Health Maintenance  Topic Date Due  . INFLUENZA VACCINE  02/13/2017  . TETANUS/TDAP  03/04/2022  . HIV Screening  Completed    Lab Results  Component Value Date   WBC 10.0 06/26/2016   HGB 13.0 06/26/2016   HCT 39.5 06/26/2016   PLT 357.0 06/26/2016   GLUCOSE 90 05/24/2016   CHOL 222 (H) 05/24/2016   TRIG 150.0 (H) 05/24/2016   HDL 32.00 (L) 05/24/2016   LDLCALC 160 (H) 05/24/2016   ALT 25 05/24/2016   AST 23 05/24/2016   NA 138 05/24/2016   K 3.6 05/24/2016   CL 102 05/24/2016   CREATININE 0.96 05/24/2016   BUN 11 05/24/2016   CO2 28 05/24/2016   TSH 2.19  05/24/2016   PSA 0.9 07/26/2014   HGBA1C 6.0 05/24/2016    Lab Results  Component Value Date   TSH 2.19 05/24/2016   Lab Results  Component Value Date   WBC 10.0 06/26/2016   HGB 13.0 06/26/2016   HCT 39.5 06/26/2016   MCV 79.1 06/26/2016   PLT 357.0 06/26/2016   Lab Results  Component Value Date   NA 138 05/24/2016   K 3.6 05/24/2016   CO2 28 05/24/2016   GLUCOSE 90 05/24/2016   BUN 11 05/24/2016  CREATININE 0.96 05/24/2016   BILITOT 0.4 05/24/2016   ALKPHOS 69 05/24/2016   AST 23 05/24/2016   ALT 25 05/24/2016   PROT 7.8 05/24/2016   ALBUMIN 4.3 05/24/2016   CALCIUM 9.2 05/24/2016   GFR 90.71 05/24/2016   Lab Results  Component Value Date   CHOL 222 (H) 05/24/2016   Lab Results  Component Value Date   HDL 32.00 (L) 05/24/2016   Lab Results  Component Value Date   LDLCALC 160 (H) 05/24/2016   Lab Results  Component Value Date   TRIG 150.0 (H) 05/24/2016   Lab Results  Component Value Date   CHOLHDL 7 05/24/2016   Lab Results  Component Value Date   HGBA1C 6.0 05/24/2016         Assessment & Plan:   Problem List Items Addressed This Visit    Hypertension - Primary    Well controlled, no changes to meds. Encouraged heart healthy diet such as the DASH diet and exercise as tolerated.       Relevant Orders   CBC   Comprehensive metabolic panel   TSH   Hyperlipidemia with target LDL less than 100    Tolerating statin, encouraged heart healthy diet, avoid trans fats, minimize simple carbs and saturated fats. Increase exercise as tolerated      Relevant Orders   Lipid panel   Obesity    Encouraged DASH diet, decrease po intake and increase exercise as tolerated. Needs 7-8 hours of sleep nightly. Avoid trans fats, eat small, frequent meals every 4-5 hours with lean proteins, complex carbs and healthy fats. Minimize simple carbs, bariatric referral      Hyperglycemia    hgba1c acceptable, minimize simple carbs. Increase exercise as  tolerated.       Relevant Orders   Hemoglobin A1c   Pain in joint, ankle and foot    Injured left ankle in April 2018, slowly improving. Was seen by sports med, Dr Pearletha Forge and has been given exercises to strengthen. Will return to sports med as neede      Headache   Relevant Medications   traMADol (ULTRAM) 50 MG tablet   Preventative health care    Patient encouraged to maintain heart healthy diet, regular exercise, adequate sleep. Consider daily probiotics. Take medications as prescribed      Relevant Orders   TSH      I am having Jason Harper maintain his aspirin EC, omeprazole, escitalopram, Omega-3 Fatty Acids (FISH OIL OMEGA-3 PO), nitroGLYCERIN, carisoprodol, amLODipine, fenofibrate, rosuvastatin, losartan-hydrochlorothiazide, meloxicam, and traMADol.  Meds ordered this encounter  Medications  . traMADol (ULTRAM) 50 MG tablet    Sig: Take 1 tablet (50 mg total) by mouth every 12 (twelve) hours as needed for moderate pain or severe pain.    Dispense:  60 tablet    Refill:  0    CMA served as scribe during this visit. History, Physical and Plan performed by medical provider. Documentation and orders reviewed and attested to.  Danise Edge, MD

## 2016-11-12 NOTE — Assessment & Plan Note (Signed)
Tolerating statin, encouraged heart healthy diet, avoid trans fats, minimize simple carbs and saturated fats. Increase exercise as tolerated 

## 2016-11-13 ENCOUNTER — Other Ambulatory Visit: Payer: Self-pay | Admitting: Family Medicine

## 2016-11-13 DIAGNOSIS — E876 Hypokalemia: Secondary | ICD-10-CM

## 2016-11-13 MED ORDER — POTASSIUM CHLORIDE CRYS ER 20 MEQ PO TBCR: EXTENDED_RELEASE_TABLET | ORAL | 0 refills | Status: DC

## 2016-11-16 ENCOUNTER — Other Ambulatory Visit (INDEPENDENT_AMBULATORY_CARE_PROVIDER_SITE_OTHER): Payer: BLUE CROSS/BLUE SHIELD

## 2016-11-16 DIAGNOSIS — E876 Hypokalemia: Secondary | ICD-10-CM | POA: Diagnosis not present

## 2016-11-16 LAB — COMPREHENSIVE METABOLIC PANEL
ALBUMIN: 4.3 g/dL (ref 3.5–5.2)
ALK PHOS: 58 U/L (ref 39–117)
ALT: 28 U/L (ref 0–53)
AST: 24 U/L (ref 0–37)
BUN: 9 mg/dL (ref 6–23)
CALCIUM: 9.5 mg/dL (ref 8.4–10.5)
CHLORIDE: 104 meq/L (ref 96–112)
CO2: 29 mEq/L (ref 19–32)
CREATININE: 0.9 mg/dL (ref 0.40–1.50)
GFR: 97.51 mL/min (ref >60.00)
Glucose, Bld: 94 mg/dL (ref 70–99)
Potassium: 3.6 mEq/L (ref 3.5–5.1)
Sodium: 140 mEq/L (ref 135–145)
Total Bilirubin: 0.3 mg/dL (ref 0.2–1.2)
Total Protein: 7.1 g/dL (ref 6.0–8.3)

## 2017-04-29 ENCOUNTER — Ambulatory Visit (INDEPENDENT_AMBULATORY_CARE_PROVIDER_SITE_OTHER): Payer: BLUE CROSS/BLUE SHIELD | Admitting: Family Medicine

## 2017-04-29 ENCOUNTER — Encounter: Payer: Self-pay | Admitting: Family Medicine

## 2017-04-29 DIAGNOSIS — I10 Essential (primary) hypertension: Secondary | ICD-10-CM

## 2017-04-29 DIAGNOSIS — Z23 Encounter for immunization: Secondary | ICD-10-CM | POA: Diagnosis not present

## 2017-04-29 DIAGNOSIS — Z79899 Other long term (current) drug therapy: Secondary | ICD-10-CM | POA: Diagnosis not present

## 2017-04-29 DIAGNOSIS — R300 Dysuria: Secondary | ICD-10-CM | POA: Insufficient documentation

## 2017-04-29 DIAGNOSIS — E785 Hyperlipidemia, unspecified: Secondary | ICD-10-CM

## 2017-04-29 DIAGNOSIS — E6609 Other obesity due to excess calories: Secondary | ICD-10-CM | POA: Diagnosis not present

## 2017-04-29 DIAGNOSIS — J069 Acute upper respiratory infection, unspecified: Secondary | ICD-10-CM | POA: Insufficient documentation

## 2017-04-29 DIAGNOSIS — M109 Gout, unspecified: Secondary | ICD-10-CM

## 2017-04-29 DIAGNOSIS — R739 Hyperglycemia, unspecified: Secondary | ICD-10-CM | POA: Diagnosis not present

## 2017-04-29 DIAGNOSIS — M25572 Pain in left ankle and joints of left foot: Secondary | ICD-10-CM | POA: Diagnosis not present

## 2017-04-29 HISTORY — DX: Gout, unspecified: M10.9

## 2017-04-29 LAB — COMPREHENSIVE METABOLIC PANEL
ALT: 28 U/L (ref 0–53)
AST: 25 U/L (ref 0–37)
Albumin: 4.2 g/dL (ref 3.5–5.2)
Alkaline Phosphatase: 66 U/L (ref 39–117)
BUN: 11 mg/dL (ref 6–23)
CALCIUM: 9.7 mg/dL (ref 8.4–10.5)
CHLORIDE: 100 meq/L (ref 96–112)
CO2: 29 meq/L (ref 19–32)
CREATININE: 0.92 mg/dL (ref 0.40–1.50)
GFR: 94.87 mL/min (ref >60.00)
GLUCOSE: 106 mg/dL — AB (ref 70–99)
Potassium: 4 mEq/L (ref 3.5–5.1)
SODIUM: 137 meq/L (ref 135–145)
Total Bilirubin: 0.5 mg/dL (ref 0.2–1.2)
Total Protein: 7.5 g/dL (ref 6.0–8.3)

## 2017-04-29 LAB — CBC
HCT: 39.9 % (ref 39.0–52.0)
Hemoglobin: 13.3 g/dL (ref 13.0–17.0)
MCHC: 33.3 g/dL (ref 30.0–36.0)
MCV: 82.2 fl (ref 78.0–100.0)
PLATELETS: 391 10*3/uL (ref 150.0–400.0)
RBC: 4.86 Mil/uL (ref 4.22–5.81)
RDW: 14.6 % (ref 11.5–15.5)
WBC: 10.2 10*3/uL (ref 4.0–10.5)

## 2017-04-29 LAB — LIPID PANEL
CHOL/HDL RATIO: 8
Cholesterol: 211 mg/dL — ABNORMAL HIGH (ref 0–200)
HDL: 25.8 mg/dL — AB (ref >39.00)
LDL CALC: 148 mg/dL — AB (ref 0–99)
NONHDL: 184.8
TRIGLYCERIDES: 185 mg/dL — AB (ref 0.0–149.0)
VLDL: 37 mg/dL (ref 0.0–40.0)

## 2017-04-29 LAB — TSH: TSH: 1.55 u[IU]/mL (ref 0.35–4.50)

## 2017-04-29 LAB — URINALYSIS
Bilirubin Urine: NEGATIVE
Hgb urine dipstick: NEGATIVE
Ketones, ur: NEGATIVE
Leukocytes, UA: NEGATIVE
NITRITE: NEGATIVE
PH: 7 (ref 5.0–8.0)
Specific Gravity, Urine: 1.01 (ref 1.000–1.030)
TOTAL PROTEIN, URINE-UPE24: NEGATIVE
Urine Glucose: NEGATIVE
Urobilinogen, UA: 0.2 (ref 0.0–1.0)

## 2017-04-29 LAB — HEMOGLOBIN A1C: HEMOGLOBIN A1C: 6.3 % (ref 4.6–6.5)

## 2017-04-29 MED ORDER — INDOMETHACIN 50 MG PO CAPS: 50.0000 mg | ORAL_CAPSULE | Freq: Three times a day (TID) | ORAL | 2 refills | Status: DC | PRN

## 2017-04-29 MED ORDER — AMLODIPINE BESYLATE 10 MG PO TABS: 10.0000 mg | ORAL_TABLET | Freq: Every day | ORAL | 5 refills | Status: DC

## 2017-04-29 MED ORDER — TRAMADOL HCL 50 MG PO TABS
50.0000 mg | ORAL_TABLET | Freq: Two times a day (BID) | ORAL | 0 refills | Status: DC | PRN
Start: 2017-04-29 — End: 2017-11-12

## 2017-04-29 MED ORDER — LOSARTAN POTASSIUM-HCTZ 100-25 MG PO TABS: 1.0000 | ORAL_TABLET | Freq: Every day | ORAL | 5 refills | Status: DC

## 2017-04-29 MED ORDER — COLCHICINE 0.6 MG PO TABS: ORAL_TABLET | ORAL | 2 refills | Status: DC

## 2017-04-29 MED ORDER — HYDROCODONE-ACETAMINOPHEN 5-325 MG PO TABS: 1.0000 | ORAL_TABLET | Freq: Three times a day (TID) | ORAL | 0 refills | Status: DC | PRN

## 2017-04-29 NOTE — Assessment & Plan Note (Signed)
UDS obtained today. Contract up to date. Tramadol prescription written. He takes meds sparingly but does get some relief

## 2017-04-29 NOTE — Patient Instructions (Addendum)
Encouraged increased rest and hydration, add probiotics, zinc such as Coldeze or Xicam. Treat fevers as needed Elderberry liquid, Vitamin C 500 to 1000 mg daily, Mucinex twice daily    Sinusitis, Adult Sinusitis is soreness and inflammation of your sinuses. Sinuses are hollow spaces in the bones around your face. They are located:  Around your eyes.  In the middle of your forehead.  Behind your nose.  In your cheekbones.  Your sinuses and nasal passages are lined with a stringy fluid (mucus). Mucus normally drains out of your sinuses. When your nasal tissues get inflamed or swollen, the mucus can get trapped or blocked so air cannot flow through your sinuses. This lets bacteria, viruses, and funguses grow, and that leads to infection. Follow these instructions at home: Medicines  Take, use, or apply over-the-counter and prescription medicines only as told by your doctor. These may include nasal sprays.  If you were prescribed an antibiotic medicine, take it as told by your doctor. Do not stop taking the antibiotic even if you start to feel better. Hydrate and Humidify  Drink enough water to keep your pee (urine) clear or pale yellow.  Use a cool mist humidifier to keep the humidity level in your home above 50%.  Breathe in steam for 10-15 minutes, 3-4 times a day or as told by your doctor. You can do this in the bathroom while a hot shower is running.  Try not to spend time in cool or dry air. Rest  Rest as much as possible.  Sleep with your head raised (elevated).  Make sure to get enough sleep each night. General instructions  Put a warm, moist washcloth on your face 3-4 times a day or as told by your doctor. This will help with discomfort.  Wash your hands often with soap and water. If there is no soap and water, use hand sanitizer.  Do not smoke. Avoid being around people who are smoking (secondhand smoke).  Keep all follow-up visits as told by your doctor. This is  important. Contact a doctor if:  You have a fever.  Your symptoms get worse.  Your symptoms do not get better within 10 days. Get help right away if:  You have a very bad headache.  You cannot stop throwing up (vomiting).  You have pain or swelling around your face or eyes.  You have trouble seeing.  You feel confused.  Your neck is stiff.  You have trouble breathing. This information is not intended to replace advice given to you by your health care provider. Make sure you discuss any questions you have with your health care provider. Document Released: 12/19/2007 Document Revised: 02/26/2016 Document Reviewed: 04/27/2015 Elsevier Interactive Patient Education  [employer].

## 2017-04-29 NOTE — Assessment & Plan Note (Addendum)
Encouraged heart healthy diet, increase exercise, avoid trans fats, consider a krill oil cap daily, has increased, will need to take crestor consistently and possibly increase strength

## 2017-04-29 NOTE — Assessment & Plan Note (Signed)
Encouraged increased rest and hydration, add probiotics, zinc such as Coldeze or Xicam. Treat fevers as needed, mucinex, vit c, elderberry. Call if worsens

## 2017-04-29 NOTE — Assessment & Plan Note (Signed)
hgba1c acceptable, minimize simple carbs. Increase exercise as tolerated.  

## 2017-04-29 NOTE — Progress Notes (Signed)
Subjective:  I acted as a Neurosurgeon for Dr. Abner Greenspan. [place], [place]  Patient ID: Jason Harper, male    DOB: [birth year], 46 y.o.   MRN: [id number]  No chief complaint on file.   HPI  Patient is in today for a 6 month follow up And he reports overall he is doing well. He doesn't knowledge she's had some minor sinus congestion just for a couple of days.'s some yellow rhinorrhea times. Denies fevers, chills, headaches, cough or chest symptoms. No diarrhea or nausea vomiting. No ear pain or sore throat. He continues to struggle with joint and back pain and uses tramadol infrequently with good results. He has no other acute concerns. No recent febrile illness or hospitalization. Denies CP/palp/SOB/HA/fevers/GI or GU c/o. Taking meds as prescribed  Patient Care Team: Bradd Canary, MD as PCP - General (Family Medicine) Elinor Parkinson, [place] as Consulting Physician (Podiatry) Lenda Kelp, MD as Consulting Physician (Sports Medicine)   Past Medical History:  Diagnosis Date  . Gout 04/29/2017  . Headache 11/12/2016  . Hearing loss in right ear 03/22/2015   Secondary to Scarlet Fever as child  . High cholesterol   . Hypertension   . Obesity 03/22/2015  . Preventative health care 11/12/2016    No past surgical history on file.  Family History  Problem Relation Age of Onset  . Hyperlipidemia Mother   . Hypertension Mother   . Arthritis Mother   . Hypertension Father   . Hyperlipidemia Father   . Heart disease Father        MI  . Hypertension Brother   . Heart disease Brother        MI    Social History   Social History  . Marital status: Legally Separated    Spouse name: N/A  . Number of children: N/A  . Years of education: N/A   Occupational History  . Not on file.   Social History Main Topics  . Smoking status: Never Smoker  . Smokeless tobacco: Never Used  . Alcohol use 0.6 oz/week    1 Cans of beer per week  . Drug use: No  . 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 Not on file   Social History Narrative  . No narrative on file    Outpatient Medications Prior to Visit  Medication Sig Dispense Refill  . aspirin EC 81 MG tablet Take 81 mg by mouth daily.    . carisoprodol (SOMA) 350 MG tablet Take 1 tablet (350 mg total) by mouth 3 (three) times daily as needed for muscle spasms. 30 tablet 1  . escitalopram (LEXAPRO) 10 MG tablet 1/2 tab po daily x 7 days then increase to 1 tab po daily 30 tablet 3  . fenofibrate (TRICOR) 145 MG tablet Take 1 tablet (145 mg total) by mouth daily. 30 tablet 5  . nitroGLYCERIN (NITRODUR - DOSED IN MG/24 HR) 0.2 mg/hr patch Apply 1/4th patch to affected ankle, change daily 30 patch 1  . Omega-3 Fatty Acids (FISH OIL OMEGA-3 PO) Take 1 tablet by mouth daily.    Marland Kitchen omeprazole (PRILOSEC) 40 MG capsule Take 1 capsule (40 mg total) by mouth daily. 30 capsule 5  . potassium chloride SA (K-DUR,KLOR-CON) 20 MEQ tablet Take 2 tablets daily for 5 days, then take 1 tablet by mouth daily 35 tablet 0  . rosuvastatin (CRESTOR) 20  MG tablet Take 1 tablet (20 mg total) by mouth daily. 30 tablet 5  . amLODipine (NORVASC) 10 MG tablet Take 1 tablet (10 mg total) by mouth daily. 30 tablet 5  . losartan-hydrochlorothiazide (HYZAAR) 100-25 MG tablet Take 1 tablet by mouth daily. 30 tablet 5  . meloxicam (MOBIC) 15 MG tablet Take 1 tablet (15 mg total) by mouth daily. 30 tablet 3  . traMADol (ULTRAM) 50 MG tablet Take 1 tablet (50 mg total) by mouth every 12 (twelve) hours as needed for moderate pain or severe pain. 60 tablet 0   No facility-administered medications prior to visit.     Allergies  Allergen Reactions  . Penicillins Swelling    Review of Systems  Constitutional: Negative for fever and malaise/fatigue.  HENT: Positive for congestion.   Eyes: Negative for blurred vision.  Respiratory: Positive for sputum production. Negative  for shortness of breath.   Cardiovascular: Negative for chest pain, palpitations and leg swelling.  Gastrointestinal: Negative for abdominal pain, blood in stool and nausea.  Genitourinary: Positive for dysuria. Negative for frequency.  Musculoskeletal: Positive for joint pain. Negative for falls.  Skin: Negative for rash.  Neurological: Negative for dizziness, loss of consciousness and headaches.  Endo/Heme/Allergies: Negative for environmental allergies.  Psychiatric/Behavioral: Negative for depression. The patient is not nervous/anxious.        Objective:    Physical Exam  Constitutional: He is oriented to person, place, and time. He appears well-developed and well-nourished. No distress.  HENT:  Head: Normocephalic and atraumatic.  Nose: Nose normal.  Eyes: Right eye exhibits no discharge. Left eye exhibits no discharge.  Neck: Normal range of motion. Neck supple.  Cardiovascular: Normal rate and regular rhythm.   No murmur heard. Pulmonary/Chest: Effort normal and breath sounds normal.  Abdominal: Soft. Bowel sounds are normal. There is no tenderness.  Musculoskeletal: He exhibits no edema.  Neurological: He is alert and oriented to person, place, and time.  Skin: Skin is warm and dry.  Psychiatric: He has a normal mood and affect.  Nursing note and vitals reviewed.   There were no vitals taken for this visit. Wt Readings from Last 3 Encounters:  11/12/16 233 lb 3.2 oz (105.8 kg)  08/24/16 232 lb (105.2 kg)  07/13/16 240 lb 6.4 oz (109 kg)   BP Readings from Last 3 Encounters:  11/12/16 118/80  08/24/16 (!) 147/85  07/13/16 130/80     Immunization History  Administered Date(s) Administered  . Influenza,inj,Quad PF,6+ Mos 04/19/2014, 06/17/2015, 04/29/2017    Health Maintenance  Topic Date Due  . INFLUENZA VACCINE  02/13/2017  . TETANUS/TDAP  03/04/2022  . HIV Screening  Completed    Lab Results  Component Value Date   WBC 10.2 04/29/2017   HGB 13.3  04/29/2017   HCT 39.9 04/29/2017   PLT 391.0 04/29/2017   GLUCOSE 106 (H) 04/29/2017   CHOL 211 (H) 04/29/2017   TRIG 185.0 (H) 04/29/2017   HDL 25.80 (L) 04/29/2017   LDLCALC 148 (H) 04/29/2017   ALT 28 04/29/2017   AST 25 04/29/2017   NA 137 04/29/2017   K 4.0 04/29/2017   CL 100 04/29/2017   CREATININE 0.92 04/29/2017   BUN 11 04/29/2017   CO2 29 04/29/2017   TSH 1.55 04/29/2017   PSA 0.9 07/26/2014   HGBA1C 6.3 04/29/2017    Lab Results  Component Value Date   TSH 1.55 04/29/2017   Lab Results  Component Value Date   WBC 10.2 04/29/2017  HGB 13.3 04/29/2017   HCT 39.9 04/29/2017   MCV 82.2 04/29/2017   PLT 391.0 04/29/2017   Lab Results  Component Value Date   NA 137 04/29/2017   K 4.0 04/29/2017   CO2 29 04/29/2017   GLUCOSE 106 (H) 04/29/2017   BUN 11 04/29/2017   CREATININE 0.92 04/29/2017   BILITOT 0.5 04/29/2017   ALKPHOS 66 04/29/2017   AST 25 04/29/2017   ALT 28 04/29/2017   PROT 7.5 04/29/2017   ALBUMIN 4.2 04/29/2017   CALCIUM 9.7 04/29/2017   GFR 94.87 04/29/2017   Lab Results  Component Value Date   CHOL 211 (H) 04/29/2017   Lab Results  Component Value Date   HDL 25.80 (L) 04/29/2017   Lab Results  Component Value Date   LDLCALC 148 (H) 04/29/2017   Lab Results  Component Value Date   TRIG 185.0 (H) 04/29/2017   Lab Results  Component Value Date   CHOLHDL 8 04/29/2017   Lab Results  Component Value Date   HGBA1C 6.3 04/29/2017         Assessment & Plan:   Problem List Items Addressed This Visit    Hypertension    Not well controlled, no changes to meds. Encouraged heart healthy diet such as the DASH diet and exercise as tolerated.       Relevant Medications   losartan-hydrochlorothiazide (HYZAAR) 100-25 MG tablet   amLODipine (NORVASC) 10 MG tablet   Other Relevant Orders   CBC (Completed)   Comprehensive metabolic panel (Completed)   TSH (Completed)   Hyperlipidemia with target LDL less than 100     Encouraged heart healthy diet, increase exercise, avoid trans fats, consider a krill oil cap daily, has increased, will need to take crestor consistently and possibly increase strength      Relevant Medications   losartan-hydrochlorothiazide (HYZAAR) 100-25 MG tablet   amLODipine (NORVASC) 10 MG tablet   Other Relevant Orders   Lipid panel (Completed)   Obesity    Encouraged DASH diet, decrease po intake and increase exercise as tolerated. Needs 7-8 hours of sleep nightly. Avoid trans fats, eat small, frequent meals every 4-5 hours with lean proteins, complex carbs and healthy fats. Minimize simple carbs,       Hyperglycemia    hgba1c acceptable, minimize simple carbs. Increase exercise as tolerated.       Relevant Orders   Hemoglobin A1c (Completed)   Pain in joint, ankle and foot    UDS obtained today. Contract up to date. Tramadol prescription written. He takes meds sparingly but does get some relief      Dysuria    Notes occasionally feels dysuria,encouraged to drink more water and check a UA with c and s      Relevant Orders   Urinalysis (Completed)   Urine Culture   URI (upper respiratory infection)    Encouraged increased rest and hydration, add probiotics, zinc such as Coldeze or Xicam. Treat fevers as needed, mucinex, vit c, elderberry. Call if worsens       Other Visit Diagnoses    High risk medication use    -  Primary   Relevant Orders   Pain Mgmt, Profile 8 w/Conf, U   Needs flu shot       Relevant Orders   Flu Vaccine QUAD 6+ mos PF IM (Fluarix Quad PF) (Completed)      I have discontinued Mr. Efferson HYDROcodone-acetaminophen, colchicine, and indomethacin. I am also having him maintain his aspirin EC, omeprazole,  escitalopram, Omega-3 Fatty Acids (FISH OIL OMEGA-3 PO), nitroGLYCERIN, carisoprodol, fenofibrate, rosuvastatin, potassium chloride SA, traMADol, meloxicam, losartan-hydrochlorothiazide, and amLODipine.  Meds ordered this encounter  Medications    . DISCONTD: HYDROcodone-acetaminophen (NORCO) 5-325 MG tablet    Sig: Take 1 tablet by mouth 3 (three) times daily as needed for moderate pain.    Dispense:  30 tablet    Refill:  0  . DISCONTD: colchicine 0.6 MG tablet    Sig: 2 tabs po once then 1 tab every 2 hours til pain relief, intolerable diarrhea, max of 6 tabs in 24 hours    Dispense:  6 tablet    Refill:  2  . DISCONTD: indomethacin (INDOCIN) 50 MG capsule    Sig: Take 1 capsule (50 mg total) by mouth 3 (three) times daily as needed.    Dispense:  20 capsule    Refill:  2  . traMADol (ULTRAM) 50 MG tablet    Sig: Take 1 tablet (50 mg total) by mouth every 12 (twelve) hours as needed for moderate pain or severe pain.    Dispense:  60 tablet    Refill:  0  . meloxicam (MOBIC) 15 MG tablet    Sig: Take 1 tablet (15 mg total) by mouth daily.    Dispense:  30 tablet    Refill:  3  . losartan-hydrochlorothiazide (HYZAAR) 100-25 MG tablet    Sig: Take 1 tablet by mouth daily.    Dispense:  30 tablet    Refill:  5  . amLODipine (NORVASC) 10 MG tablet    Sig: Take 1 tablet (10 mg total) by mouth daily.    Dispense:  30 tablet    Refill:  5    CMA served as scribe during this visit. History, Physical and Plan performed by medical provider. Documentation and orders reviewed and attested to.  Danise Edge, MD

## 2017-04-29 NOTE — Assessment & Plan Note (Signed)
Not well controlled, no changes to meds. Encouraged heart healthy diet such as the DASH diet and exercise as tolerated.  

## 2017-04-29 NOTE — Assessment & Plan Note (Signed)
Notes occasionally feels dysuria,encouraged to drink more water and check a UA with c and s

## 2017-04-29 NOTE — Assessment & Plan Note (Signed)
Encouraged DASH diet, decrease po intake and increase exercise as tolerated. Needs 7-8 hours of sleep nightly. Avoid trans fats, eat small, frequent meals every 4-5 hours with lean proteins, complex carbs and healthy fats. Minimize simple carbs, 

## 2017-04-29 NOTE — Assessment & Plan Note (Deleted)
Try Colchicine if no response can try Indocmethacin and Norco

## 2017-04-30 LAB — URINE CULTURE
MICRO NUMBER:: 81146362
SPECIMEN QUALITY: ADEQUATE

## 2017-05-01 LAB — PAIN MGMT, PROFILE 8 W/CONF, U
6 Acetylmorphine: NEGATIVE ng/mL (ref <10)
AMPHETAMINES: NEGATIVE ng/mL (ref <500)
Alcohol Metabolites: NEGATIVE ng/mL (ref <500)
BENZODIAZEPINES: NEGATIVE ng/mL (ref <100)
BUPRENORPHINE, URINE: NEGATIVE ng/mL (ref <5)
Cocaine Metabolite: NEGATIVE ng/mL (ref <150)
Creatinine: 176.1 mg/dL
MDMA: NEGATIVE ng/mL (ref <500)
Marijuana Metabolite: NEGATIVE ng/mL (ref <20)
Opiates: NEGATIVE ng/mL (ref <100)
Oxidant: NEGATIVE ug/mL (ref <200)
Oxycodone: NEGATIVE ng/mL (ref <100)
PH: 6.97 (ref 4.5–9.0)

## 2017-05-03 ENCOUNTER — Telehealth: Payer: Self-pay | Admitting: Family Medicine

## 2017-05-03 NOTE — Telephone Encounter (Signed)
Relation to ZO:XWRUpt:self  Call back number:[phone] [facility] [address] - [place], [place] [phone] (Phone) [phone] (Fax)     Reason for call:  Patient last seen 04/29/17 and stated PCP stated if symtomps didn't improve she would prescribe antibiotics, please advise

## 2017-05-07 ENCOUNTER — Other Ambulatory Visit: Payer: Self-pay | Admitting: Family Medicine

## 2017-05-07 MED ORDER — DOXYCYCLINE HYCLATE 100 MG PO TABS
100.0000 mg | ORAL_TABLET | Freq: Two times a day (BID) | ORAL | 0 refills | Status: DC
Start: 2017-05-07 — End: 2017-11-12

## 2017-05-07 NOTE — Telephone Encounter (Signed)
I did just see him am willing to rx Doxycycline 100 mg tabs, 1 po twice daily x 7 days. I have sent to pharmacy please let him know

## 2017-05-07 NOTE — Telephone Encounter (Signed)
Please advise 

## 2017-05-08 NOTE — Telephone Encounter (Signed)
Patient notified

## 2017-11-12 ENCOUNTER — Ambulatory Visit (HOSPITAL_BASED_OUTPATIENT_CLINIC_OR_DEPARTMENT_OTHER)
Admission: RE | Admit: 2017-11-12 | Discharge: 2017-11-12 | Disposition: A | Discharge status: Home / Self Care | Payer: BLUE CROSS/BLUE SHIELD | Source: Ambulatory Visit | Attending: Family Medicine | Admitting: Family Medicine

## 2017-11-12 ENCOUNTER — Other Ambulatory Visit: Payer: Self-pay | Admitting: Family Medicine

## 2017-11-12 ENCOUNTER — Encounter: Payer: Self-pay | Admitting: Family Medicine

## 2017-11-12 ENCOUNTER — Ambulatory Visit (INDEPENDENT_AMBULATORY_CARE_PROVIDER_SITE_OTHER): Payer: BLUE CROSS/BLUE SHIELD | Admitting: Family Medicine

## 2017-11-12 VITALS — BP 136/92 | HR 77 | Resp 16 | Ht 65.0 in | Wt 236.0 lb

## 2017-11-12 DIAGNOSIS — R300 Dysuria: Secondary | ICD-10-CM | POA: Diagnosis not present

## 2017-11-12 DIAGNOSIS — E785 Hyperlipidemia, unspecified: Secondary | ICD-10-CM

## 2017-11-12 DIAGNOSIS — M545 Low back pain: Secondary | ICD-10-CM | POA: Diagnosis not present

## 2017-11-12 DIAGNOSIS — Z79899 Other long term (current) drug therapy: Secondary | ICD-10-CM | POA: Diagnosis not present

## 2017-11-12 DIAGNOSIS — M25552 Pain in left hip: Secondary | ICD-10-CM | POA: Insufficient documentation

## 2017-11-12 DIAGNOSIS — I10 Essential (primary) hypertension: Secondary | ICD-10-CM | POA: Diagnosis not present

## 2017-11-12 DIAGNOSIS — Z0000 Encounter for general adult medical examination without abnormal findings: Secondary | ICD-10-CM | POA: Diagnosis not present

## 2017-11-12 DIAGNOSIS — E6609 Other obesity due to excess calories: Secondary | ICD-10-CM | POA: Diagnosis not present

## 2017-11-12 DIAGNOSIS — K219 Gastro-esophageal reflux disease without esophagitis: Secondary | ICD-10-CM

## 2017-11-12 DIAGNOSIS — R739 Hyperglycemia, unspecified: Secondary | ICD-10-CM | POA: Diagnosis not present

## 2017-11-12 LAB — POC URINALSYSI DIPSTICK (AUTOMATED)
Bilirubin, UA: NEGATIVE
Blood, UA: NEGATIVE
Glucose, UA: NEGATIVE
KETONES UA: NEGATIVE
LEUKOCYTES UA: NEGATIVE
Nitrite, UA: NEGATIVE
PH UA: 6 (ref 5.0–8.0)
PROTEIN UA: 15
Spec Grav, UA: >=1.03 — AB (ref 1.010–1.025)
UROBILINOGEN UA: 0.2 U/dL

## 2017-11-12 MED ORDER — TRAMADOL HCL 50 MG PO TABS: 50.0000 mg | ORAL_TABLET | Freq: Two times a day (BID) | ORAL | 0 refills | Status: DC | PRN

## 2017-11-12 MED ORDER — OMEPRAZOLE 40 MG PO CPDR: 40.0000 mg | DELAYED_RELEASE_CAPSULE | Freq: Every day | ORAL | 5 refills | Status: DC

## 2017-11-12 MED ORDER — TIZANIDINE HCL 4 MG PO TABS: 4.0000 mg | ORAL_TABLET | Freq: Four times a day (QID) | ORAL | 1 refills | Status: DC | PRN

## 2017-11-12 MED ORDER — METHYLPREDNISOLONE 4 MG PO TABS: ORAL_TABLET | ORAL | 0 refills | Status: DC

## 2017-11-12 NOTE — Assessment & Plan Note (Signed)
Well controlled, no changes to meds. Encouraged heart healthy diet such as the DASH diet and exercise as tolerated.  °

## 2017-11-12 NOTE — Assessment & Plan Note (Signed)
hgba1c acceptable, minimize simple carbs. Increase exercise as tolerated. Continue current meds 

## 2017-11-12 NOTE — Assessment & Plan Note (Signed)
Encouraged DASH diet, decrease po intake and increase exercise as tolerated. Needs 7-8 hours of sleep nightly. Avoid trans fats, eat small, frequent meals every 4-5 hours with lean proteins, complex carbs and healthy fats. Minimize simple carbs 

## 2017-11-12 NOTE — Assessment & Plan Note (Signed)
Tolerating statin, encouraged heart healthy diet, avoid trans fats, minimize simple carbs and saturated fats. Increase exercise as tolerated 

## 2017-11-12 NOTE — Patient Instructions (Signed)
Preventive Care 18-39 Years, Male Preventive care refers to lifestyle choices and visits with your health care provider that can promote health and wellness. What does preventive care include?  A yearly physical exam. This is also called an annual well check.  Dental exams once or twice a year.  Routine eye exams. Ask your health care provider how often you should have your eyes checked.  Personal lifestyle choices, including: ? Daily care of your teeth and gums. ? Regular physical activity. ? Eating a healthy diet. ? Avoiding tobacco and drug use. ? Limiting alcohol use. ? Practicing safe sex. What happens during an annual well check? The services and screenings done by your health care provider during your annual well check will depend on your age, overall health, lifestyle risk factors, and family history of disease. Counseling Your health care provider may ask you questions about your:  Alcohol use.  Tobacco use.  Drug use.  Emotional well-being.  Home and relationship well-being.  Sexual activity.  Eating habits.  Work and work Statistician.  Screening You may have the following tests or measurements:  Height, weight, and BMI.  Blood pressure.  Lipid and cholesterol levels. These may be checked every 5 years starting at age 34.  Diabetes screening. This is done by checking your blood sugar (glucose) after you have not eaten for a while (fasting).  Skin check.  Hepatitis C blood test.  Hepatitis B blood test.  Sexually transmitted disease (STD) testing.  Discuss your test results, treatment options, and if necessary, the need for more tests with your health care provider. Vaccines Your health care provider may recommend certain vaccines, such as:  Influenza vaccine. This is recommended every year.  Tetanus, diphtheria, and acellular pertussis (Tdap, Td) vaccine. You may need a Td booster every 10 years.  Varicella vaccine. You may need this if you  have not been vaccinated.  HPV vaccine. If you are 23 or younger, you may need three doses over 6 months.  Measles, mumps, and rubella (MMR) vaccine. You may need at least one dose of MMR.You may also need a second dose.  Pneumococcal 13-valent conjugate (PCV13) vaccine. You may need this if you have certain conditions and have not been vaccinated.  Pneumococcal polysaccharide (PPSV23) vaccine. You may need one or two doses if you smoke cigarettes or if you have certain conditions.  Meningococcal vaccine. One dose is recommended if you are age 65-21 years and a first-year college student living in a residence hall, or if you have one of several medical conditions. You may also need additional booster doses.  Hepatitis A vaccine. You may need this if you have certain conditions or if you travel or work in places where you may be exposed to hepatitis A.  Hepatitis B vaccine. You may need this if you have certain conditions or if you travel or work in places where you may be exposed to hepatitis B.  Haemophilus influenzae type b (Hib) vaccine. You may need this if you have certain risk factors.  Talk to your health care provider about which screenings and vaccines you need and how often you need them. This information is not intended to replace advice given to you by your health care provider. Make sure you discuss any questions you have with your health care provider. Document Released: 08/28/2001 Document Revised: 03/21/2016 Document Reviewed: 05/03/2015 Elsevier Interactive Patient Education  [employer].

## 2017-11-12 NOTE — Assessment & Plan Note (Signed)
Patient encouraged to maintain heart healthy diet, regular exercise, adequate sleep. Consider daily probiotics. Take medications as prescribed 

## 2017-11-13 LAB — PAIN MGMT, PROFILE 8 W/CONF, U
6 Acetylmorphine: NEGATIVE ng/mL (ref <10)
Alcohol Metabolites: NEGATIVE ng/mL (ref <500)
Amphetamines: NEGATIVE ng/mL (ref <500)
Benzodiazepines: NEGATIVE ng/mL (ref <100)
Buprenorphine, Urine: NEGATIVE ng/mL (ref <5)
Cocaine Metabolite: NEGATIVE ng/mL (ref <150)
Creatinine: 192 mg/dL
MDMA: NEGATIVE ng/mL (ref <500)
Marijuana Metabolite: NEGATIVE ng/mL (ref <20)
Opiates: NEGATIVE ng/mL (ref <100)
Oxidant: NEGATIVE ug/mL (ref <200)
Oxycodone: NEGATIVE ng/mL (ref <100)
pH: 6.33 (ref 4.5–9.0)

## 2017-11-13 LAB — URINE CULTURE
MICRO NUMBER:: 90524887
Result:: NO GROWTH
SPECIMEN QUALITY:: ADEQUATE

## 2017-11-15 ENCOUNTER — Encounter: Payer: Self-pay | Admitting: Family Medicine

## 2017-11-15 DIAGNOSIS — M545 Low back pain: Secondary | ICD-10-CM

## 2017-11-15 LAB — PAIN MGMT, TRAMADOL W/MEDMATCH, U
Desmethyltramadol: NEGATIVE ng/mL (ref <100)
Tramadol: NEGATIVE ng/mL (ref <100)

## 2017-11-17 NOTE — Progress Notes (Signed)
Patient ID: Jason Harper, male   DOB: 19-Oct-1970, 47 y.o.   MRN: [id number]   Subjective:    Patient ID: Jason Harper, male    DOB: 1971-01-26, 47 y.o.   MRN: [id number]  Chief Complaint  Patient presents with  . Annual Exam  . Back Pain    pulled muscle over thre weekend doing yard work    HPI Patient is in today for annual exam and follow up on chronic medical concerns including hyperlipidemia, hypertension, obesity, and hyperglycemia. No polyuria or polydipsia. No recent febrile illness or hospitalizations. Is trying to stay active but struggles with daily pain in back and joints. Is denying any recent febrile illness or hospitalizaitons. No polyruia or polydipsia. Denies CP/palp/SOB/HA/congestion/fevers/GI or GU c/o. Taking meds as prescribed  Past Medical History:  Diagnosis Date  . Gout 04/29/2017  . Headache 11/12/2016  . Hearing loss in right ear 03/22/2015   Secondary to Scarlet Fever as child  . High cholesterol   . Hypertension   . Obesity 03/22/2015  . Preventative health care 11/12/2016    No past surgical history on file.  Family History  Problem Relation Age of Onset  . Hyperlipidemia Mother   . Hypertension Mother   . Arthritis Mother   . Hypertension Father   . Hyperlipidemia Father   . Heart disease Father        MI  . Hypertension Brother   . Heart disease Brother        MI    Social History   Socioeconomic History  . Marital status: Legally Separated    Spouse name: Not on file  . Number of children: Not on file  . Years of education: Not on file  . Highest education level: Not on file  Occupational History  . Not on file  Social Needs  . Financial resource strain: Not on file  . Food insecurity:    Worry: Not on file    Inability: Not on file  . Transportation needs:    Medical: Not on file    Non-medical: Not on file  Tobacco Use  . Smoking status: Never Smoker  . Smokeless tobacco: Never Used  Substance and Sexual Activity  . Alcohol  use: Yes    Alcohol/week: 0.6 oz    Types: 1 Cans of beer per week  . Drug use: No  . 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 Physical activity:    Days per week: Not on file    Minutes per session: Not on file  . Stress: Not on file  Relationships  . Social connections:    Talks on phone: Not on file    Gets together: Not on file    Attends religious service: Not on file    Active member of club or organization: Not on file    Attends meetings of clubs or organizations: Not on file    Relationship status: Not on file  . Intimate partner violence:    Fear of current or ex partner: Not on file    Emotionally abused: Not on file    Physically abused: Not on file    Forced sexual activity: Not on file  Other Topics Concern  . Not on file  Social History Narrative  . Not on file    Outpatient Medications Prior to Visit  Medication Sig Dispense Refill  . amLODipine (NORVASC) 10 MG tablet Take  1 tablet (10 mg total) by mouth daily. 30 tablet 5  . aspirin EC 81 MG tablet Take 81 mg by mouth daily.    . carisoprodol (SOMA) 350 MG tablet Take 1 tablet (350 mg total) by mouth 3 (three) times daily as needed for muscle spasms. 30 tablet 1  . escitalopram (LEXAPRO) 10 MG tablet 1/2 tab po daily x 7 days then increase to 1 tab po daily 30 tablet 3  . losartan-hydrochlorothiazide (HYZAAR) 100-25 MG tablet Take 1 tablet by mouth daily. 30 tablet 5  . nitroGLYCERIN (NITRODUR - DOSED IN MG/24 HR) 0.2 mg/hr patch Apply 1/4th patch to affected ankle, change daily 30 patch 1  . Omega-3 Fatty Acids (FISH OIL OMEGA-3 PO) Take 1 tablet by mouth daily.    . potassium chloride SA (K-DUR,KLOR-CON) 20 MEQ tablet Take 2 tablets daily for 5 days, then take 1 tablet by mouth daily 35 tablet 0  . doxycycline (VIBRA-TABS) 100 MG tablet Take 1 tablet (100 mg total) by mouth 2 (two) times daily. 14 tablet 0  . fenofibrate  (TRICOR) 145 MG tablet Take 1 tablet (145 mg total) by mouth daily. 30 tablet 5  . omeprazole (PRILOSEC) 40 MG capsule Take 1 capsule (40 mg total) by mouth daily. 30 capsule 5  . rosuvastatin (CRESTOR) 20 MG tablet Take 1 tablet (20 mg total) by mouth daily. 30 tablet 5  . meloxicam (MOBIC) 15 MG tablet Take 1 tablet (15 mg total) by mouth daily. 30 tablet 3  . traMADol (ULTRAM) 50 MG tablet Take 1 tablet (50 mg total) by mouth every 12 (twelve) hours as needed for moderate pain or severe pain. (Patient not taking: Reported on 11/12/2017) 60 tablet 0   No facility-administered medications prior to visit.     Allergies  Allergen Reactions  . Penicillins Swelling    Review of Systems  Constitutional: Negative for chills, fever and malaise/fatigue.  HENT: Negative for congestion and hearing loss.   Eyes: Negative for discharge.  Respiratory: Negative for cough, sputum production and shortness of breath.   Cardiovascular: Negative for chest pain, palpitations and leg swelling.  Gastrointestinal: Negative for abdominal pain, blood in stool, constipation, diarrhea, heartburn, nausea and vomiting.  Genitourinary: Positive for dysuria and frequency. Negative for hematuria and urgency.  Musculoskeletal: Positive for joint pain. Negative for back pain, falls and myalgias.  Skin: Negative for rash.  Neurological: Negative for dizziness, sensory change, loss of consciousness, weakness and headaches.  Endo/Heme/Allergies: Negative for environmental allergies. Does not bruise/bleed easily.  Psychiatric/Behavioral: Negative for depression and suicidal ideas. The patient is not nervous/anxious and does not have insomnia.        Objective:    Physical Exam  Constitutional: He is oriented to person, place, and time. No distress.  HENT:  Head: Normocephalic and atraumatic.  Eyes: Conjunctivae are normal.  Neck: Neck supple. No thyromegaly present.  Cardiovascular: Normal rate, regular rhythm and  normal heart sounds.  No murmur heard. Pulmonary/Chest: Effort normal and breath sounds normal. No respiratory distress.  Abdominal: He exhibits no distension and no mass. There is no tenderness.  Musculoskeletal: He exhibits no edema.  Neurological: He is alert and oriented to person, place, and time.  Skin: Skin is warm.  Psychiatric: Judgment normal.    BP (!) 136/92   Pulse 77   Resp 16   Ht  (1.651 m)   Wt 236 lb (107 kg)   SpO2 99%   BMI 39.27 kg/m  Wt Readings from  Last 3 Encounters:  11/12/17 236 lb (107 kg)  11/12/16 233 lb 3.2 oz (105.8 kg)  08/24/16 232 lb (105.2 kg)     Lab Results  Component Value Date   WBC 10.2 04/29/2017   HGB 13.3 04/29/2017   HCT 39.9 04/29/2017   PLT 391.0 04/29/2017   GLUCOSE 106 (H) 04/29/2017   CHOL 211 (H) 04/29/2017   TRIG 185.0 (H) 04/29/2017   HDL 25.80 (L) 04/29/2017   LDLCALC 148 (H) 04/29/2017   ALT 28 04/29/2017   AST 25 04/29/2017   NA 137 04/29/2017   K 4.0 04/29/2017   CL 100 04/29/2017   CREATININE 0.92 04/29/2017   BUN 11 04/29/2017   CO2 29 04/29/2017   TSH 1.55 04/29/2017   PSA 0.9 07/26/2014   HGBA1C 6.3 04/29/2017    Lab Results  Component Value Date   TSH 1.55 04/29/2017   Lab Results  Component Value Date   WBC 10.2 04/29/2017   HGB 13.3 04/29/2017   HCT 39.9 04/29/2017   MCV 82.2 04/29/2017   PLT 391.0 04/29/2017   Lab Results  Component Value Date   NA 137 04/29/2017   K 4.0 04/29/2017   CO2 29 04/29/2017   GLUCOSE 106 (H) 04/29/2017   BUN 11 04/29/2017   CREATININE 0.92 04/29/2017   BILITOT 0.5 04/29/2017   ALKPHOS 66 04/29/2017   AST 25 04/29/2017   ALT 28 04/29/2017   PROT 7.5 04/29/2017   ALBUMIN 4.2 04/29/2017   CALCIUM 9.7 04/29/2017   GFR 94.87 04/29/2017   Lab Results  Component Value Date   CHOL 211 (H) 04/29/2017   Lab Results  Component Value Date   HDL 25.80 (L) 04/29/2017   Lab Results  Component Value Date   LDLCALC 148 (H) 04/29/2017   Lab Results    Component Value Date   TRIG 185.0 (H) 04/29/2017   Lab Results  Component Value Date   CHOLHDL 8 04/29/2017   Lab Results  Component Value Date   HGBA1C 6.3 04/29/2017       Assessment & Plan:   Problem List Items Addressed This Visit    Hypertension    Well controlled, no changes to meds. Encouraged heart healthy diet such as the DASH diet and exercise as tolerated.       Hyperlipidemia with target LDL less than 100    Tolerating statin, encouraged heart healthy diet, avoid trans fats, minimize simple carbs and saturated fats. Increase exercise as tolerated      GERD (gastroesophageal reflux disease)   Relevant Medications   omeprazole (PRILOSEC) 40 MG capsule   Obesity    Encouraged DASH diet, decrease po intake and increase exercise as tolerated. Needs 7-8 hours of sleep nightly. Avoid trans fats, eat small, frequent meals every 4-5 hours with lean proteins, complex carbs and healthy fats. Minimize simple carbs      Hyperglycemia    hgba1c acceptable, minimize simple carbs. Increase exercise as tolerated. Continue current meds      Preventative health care    Patient encouraged to maintain heart healthy diet, regular exercise, adequate sleep. Consider daily probiotics. Take medications as prescribed      Dysuria   Relevant Orders   Urine Culture (Completed)   POCT Urinalysis Dipstick (Automated) (Completed)    Other Visit Diagnoses    Pain of left hip joint    -  Primary   Relevant Orders   DG HIPS BILAT W OR W/O PELVIS 2V (Completed)   Pain  Mgmt, Profile 8 w/Conf, U (Completed)   Pain Mgmt, Tramadol w/medMATCH, U (Completed)   Low back pain, unspecified back pain laterality, unspecified chronicity, with sciatica presence unspecified       Relevant Medications   methylPREDNISolone (MEDROL) 4 MG tablet   tiZANidine (ZANAFLEX) 4 MG tablet   traMADol (ULTRAM) 50 MG tablet   Other Relevant Orders   DG HIPS BILAT W OR W/O PELVIS 2V (Completed)   Pain Mgmt,  Profile 8 w/Conf, U (Completed)   Pain Mgmt, Tramadol w/medMATCH, U (Completed)   High risk medication use       Relevant Orders   Pain Mgmt, Profile 8 w/Conf, U (Completed)   Pain Mgmt, Tramadol w/medMATCH, U (Completed)      I have discontinued Earl Lites Derflinger's doxycycline. I am also having him start on methylPREDNISolone and tiZANidine. Additionally, I am having him maintain his aspirin EC, escitalopram, Omega-3 Fatty Acids (FISH OIL OMEGA-3 PO), nitroGLYCERIN, carisoprodol, potassium chloride SA, meloxicam, losartan-hydrochlorothiazide, amLODipine, omeprazole, and traMADol.  Meds ordered this encounter  Medications  . methylPREDNISolone (MEDROL) 4 MG tablet    Sig: 5 tab po qd X 1d then 4 tab po qd X 1d then 3 tab po qd X 1d then 2 tab po qd then 1 tab po qd    Dispense:  15 tablet    Refill:  0  . tiZANidine (ZANAFLEX) 4 MG tablet    Sig: Take 1 tablet (4 mg total) by mouth every 6 (six) hours as needed for muscle spasms.    Dispense:  40 tablet    Refill:  1  . omeprazole (PRILOSEC) 40 MG capsule    Sig: Take 1 capsule (40 mg total) by mouth daily.    Dispense:  30 capsule    Refill:  5  . traMADol (ULTRAM) 50 MG tablet    Sig: Take 1 tablet (50 mg total) by mouth every 12 (twelve) hours as needed for moderate pain or severe pain.    Dispense:  60 tablet    Refill:  0     Danise Edge, MD

## 2017-11-19 ENCOUNTER — Other Ambulatory Visit: Payer: Self-pay

## 2017-11-19 DIAGNOSIS — M545 Low back pain: Secondary | ICD-10-CM

## 2017-11-19 MED ORDER — TRAMADOL HCL 50 MG PO TABS: 50.0000 mg | ORAL_TABLET | Freq: Two times a day (BID) | ORAL | 0 refills | Status: DC | PRN

## 2017-11-20 ENCOUNTER — Telehealth: Payer: Self-pay

## 2017-11-20 NOTE — Telephone Encounter (Signed)
PA initiated via Covermymeds; KEY: [id number]. Received real-time PA approval.   Questionnaire submitted. PA Case [id number] Status: Approved. Authorization and Notifications Completed.

## 2018-02-21 ENCOUNTER — Ambulatory Visit: Payer: BLUE CROSS/BLUE SHIELD | Admitting: Family Medicine

## 2018-02-21 ENCOUNTER — Encounter: Payer: Self-pay | Admitting: Family Medicine

## 2018-02-21 VITALS — BP 138/87 | HR 66 | Temp 98.4°F | Ht 65.0 in | Wt 235.2 lb

## 2018-02-21 DIAGNOSIS — R739 Hyperglycemia, unspecified: Secondary | ICD-10-CM

## 2018-02-21 DIAGNOSIS — M25572 Pain in left ankle and joints of left foot: Secondary | ICD-10-CM

## 2018-02-21 DIAGNOSIS — E785 Hyperlipidemia, unspecified: Secondary | ICD-10-CM

## 2018-02-21 DIAGNOSIS — T63441A Toxic effect of venom of bees, accidental (unintentional), initial encounter: Secondary | ICD-10-CM | POA: Diagnosis not present

## 2018-02-21 DIAGNOSIS — I10 Essential (primary) hypertension: Secondary | ICD-10-CM | POA: Diagnosis not present

## 2018-02-21 DIAGNOSIS — G8929 Other chronic pain: Secondary | ICD-10-CM

## 2018-02-21 DIAGNOSIS — M545 Low back pain: Secondary | ICD-10-CM

## 2018-02-21 LAB — LIPID PANEL
CHOL/HDL RATIO: 8
Cholesterol: 207 mg/dL — ABNORMAL HIGH (ref 0–200)
HDL: 27.1 mg/dL — ABNORMAL LOW (ref >39.00)
LDL CALC: 145 mg/dL — AB (ref 0–99)
NONHDL: 179.69
Triglycerides: 175 mg/dL — ABNORMAL HIGH (ref 0.0–149.0)
VLDL: 35 mg/dL (ref 0.0–40.0)

## 2018-02-21 LAB — COMPREHENSIVE METABOLIC PANEL
ALT: 38 U/L (ref 0–53)
AST: 29 U/L (ref 0–37)
Albumin: 4.4 g/dL (ref 3.5–5.2)
Alkaline Phosphatase: 64 U/L (ref 39–117)
BUN: 13 mg/dL (ref 6–23)
CHLORIDE: 102 meq/L (ref 96–112)
CO2: 28 meq/L (ref 19–32)
CREATININE: 1.05 mg/dL (ref 0.40–1.50)
Calcium: 9.6 mg/dL (ref 8.4–10.5)
GFR: 81.15 mL/min (ref >60.00)
GLUCOSE: 100 mg/dL — AB (ref 70–99)
POTASSIUM: 4.3 meq/L (ref 3.5–5.1)
SODIUM: 138 meq/L (ref 135–145)
Total Bilirubin: 0.6 mg/dL (ref 0.2–1.2)
Total Protein: 7.4 g/dL (ref 6.0–8.3)

## 2018-02-21 LAB — CBC
HEMATOCRIT: 40 % (ref 39.0–52.0)
Hemoglobin: 13.5 g/dL (ref 13.0–17.0)
MCHC: 33.8 g/dL (ref 30.0–36.0)
MCV: 81.9 fl (ref 78.0–100.0)
Platelets: 335 10*3/uL (ref 150.0–400.0)
RBC: 4.89 Mil/uL (ref 4.22–5.81)
RDW: 14.1 % (ref 11.5–15.5)
WBC: 8.8 10*3/uL (ref 4.0–10.5)

## 2018-02-21 LAB — TSH: TSH: 3.68 u[IU]/mL (ref 0.35–4.50)

## 2018-02-21 LAB — HEMOGLOBIN A1C: Hgb A1c MFr Bld: 6.2 % (ref 4.6–6.5)

## 2018-02-21 MED ORDER — TRAMADOL HCL 50 MG PO TABS
50.0000 mg | ORAL_TABLET | Freq: Two times a day (BID) | ORAL | 0 refills | Status: DC | PRN
Start: 2018-02-21 — End: 2018-05-30

## 2018-02-21 MED ORDER — TIZANIDINE HCL 4 MG PO TABS: 4.0000 mg | ORAL_TABLET | Freq: Four times a day (QID) | ORAL | 1 refills | Status: DC | PRN

## 2018-02-21 MED ORDER — EPINEPHRINE 0.3 MG/0.3ML IJ SOAJ: 0.3000 mg | Freq: Once | INTRAMUSCULAR | 1 refills | Status: AC

## 2018-02-21 NOTE — Assessment & Plan Note (Signed)
hgba1c acceptable, minimize simple carbs. Increase exercise as tolerated.  

## 2018-02-21 NOTE — Patient Instructions (Signed)
Bee, Wasp, or [employer], Adult Bees, wasps, and hornets are part of a family of insects that can sting people. These stings can cause pain and inflammation, but they are usually not serious. However, some people may have an allergic reaction to a sting. This can cause the symptoms to be more severe. What increases the risk? You may be at a greater risk of getting stung if you:  Provoke a stinging insect by swatting or disturbing it.  Wear strong-smelling soaps, deodorants, or body sprays.  Spend time outdoors near gardens with flowers or fruit trees or in clothes that expose skin.  Eat or drink outside.  What are the signs or symptoms? Common symptoms of this condition include:  A red lump in the skin that sometimes has a tiny hole in the center. In some cases, a stinger may be in the center of the wound.  Pain and itching at the sting site.  Redness and swelling around the sting site. If you have an allergic reaction (localized allergic reaction), the swelling and redness may spread out from the sting site. In some cases, this reaction can continue to develop over the next 24-48 hours.  In rare cases, a person may have a severe allergic reaction (anaphylactic reaction) to a sting. Symptoms of an anaphylactic reaction may include:  Wheezing or difficulty breathing.  Raised, itchy, red patches on the skin (hives).  Nausea or vomiting.  Abdominal cramping.  Diarrhea.  Tightness in the chest or chest pain.  Dizziness or fainting.  Redness of the face (flushing).  Hoarse voice.  Swollen tongue, lips, or face.  How is this diagnosed? This condition is usually diagnosed based on your symptoms and medical history as well as a physical exam. You may have an allergy test to determine if you are allergic to the substance that the insect injected during the sting (venom). How is this treated? If you were stung by a bee, the stinger and a small sac of venom may be in the wound.  It is important to remove the stinger as soon as possible. You can do this by brushing across the wound with gauze, a fingernail, or a flat card such as a credit card. Removing the stinger can help reduce the severity of your body's reaction to the sting. Most stings can be treated with:  Icing to reduce swelling in the area.  Medicines (antihistamines) to treat itching or an allergic reaction.  Medicines to help reduce pain. These may be medicines that you take by mouth, or medicated creams or lotions that you apply to your skin.  Pay close attention to your symptoms after you have been stung. If possible, have someone stay with you to make sure you do not have an allergic reaction. If you have any signs of an allergic reaction, call your health care provider. If you have ever had a severe allergic reaction, your health care provider may give you an inhaler or injectable medicine (epinephrine auto-injector) to use if necessary. Follow these instructions at home:  Wash the sting site 2-3 times each day with soap and water as told by your health care provider.  Apply or take over-the-counter and prescription medicines only as told by your health care provider.  If directed, apply ice to the sting area. ? Put ice in a plastic bag. ? Place a towel between your skin and the bag. ? Leave the ice on for 20 minutes, 2-3 times a day.  Do not scratch the sting  area.  If you had a severe allergic reaction to a sting, you may need: ? To wear a medical bracelet or necklace that lists the allergy. ? To learn when and how to use an anaphylaxis kit or epinephrine injection. Your family members and coworkers may also need to learn this. ? To carry an anaphylaxis kit or epinephrine injection with you at all times. How is this prevented?  Avoid swatting at stinging insects and disturbing insect nests.  Do not use fragrant soaps or lotions.  Wear shoes, pants, and long sleeves when spending time  outdoors, especially in grassy areas where stinging insects are common.  Keep outdoor areas free from nests or hives.  Keep food and drink containers covered when eating outdoors.  Avoid working or sitting near flowering plants, if possible.  Wear gloves if you are gardening or working outdoors.  If an attack by a stinging insect or a swarm seems likely in the moment, move away from the area or find a barrier between you and the insect(s), such as a door. Contact a health care provider if:  Your symptoms do not get better in 2-3 days.  You have redness, swelling, or pain that spreads beyond the area of the sting.  You have a fever. Get help right away if: You have symptoms of a severe allergic reaction. These include:  Wheezing or difficulty breathing.  Tightness in the chest or chest pain.  Light-headedness or fainting.  Itchy, raised, red patches on the skin.  Nausea or vomiting.  Abdominal cramping.  Diarrhea.  A swollen tongue or lips, or trouble swallowing.  Dizziness or fainting.  Summary  Stings from bees, wasps, and hornets can cause pain and inflammation, but they are usually not serious. However, some people may have an allergic reaction to a sting. This can cause the symptoms to be more severe.  Pay close attention to your symptoms after you have been stung. If possible, have someone stay with you to make sure you do not have an allergic reaction.  Call your health care provider if you have any signs of an allergic reaction. This information is not intended to replace advice given to you by your health care provider. Make sure you discuss any questions you have with your health care provider. Document Released: 07/02/2005 Document Revised: 09/06/2016 Document Reviewed: 09/06/2016 Elsevier Interactive Patient Education  2018 [employer]  

## 2018-02-21 NOTE — Assessment & Plan Note (Signed)
Well controlled, no changes to meds. Encouraged heart healthy diet such as the DASH diet and exercise as tolerated.  °

## 2018-02-21 NOTE — Assessment & Plan Note (Signed)
Encouraged heart healthy diet, increase exercise, avoid trans fats, consider a krill oil cap daily 

## 2018-02-23 NOTE — Progress Notes (Signed)
Subjective:    Patient ID: Jason Harper, male    DOB: [birth year], 47 y.o.   MRN: [id number]  Chief Complaint  Patient presents with  . Follow-up    Pt here for 3 month f/u visit.     HPI Patient is in today for follow up on chronic medical concerns. His left ankle he injured years ago continues to bother him routinely. He manages to ambulate but it does swell and cause him pain. He other wise reports he is doing well. No recent febrile illness or hospitalizations. No polyuria or polydipsia. Denies CP/palp/SOB/HA/congestion/fevers/GI or GU c/o. Taking meds as prescribed  Past Medical History:  Diagnosis Date  . Gout 04/29/2017  . Headache 11/12/2016  . Hearing loss in right ear 03/22/2015   Secondary to Scarlet Fever as child  . High cholesterol   . Hypertension   . Obesity 03/22/2015  . Preventative health care 11/12/2016    No past surgical history on file.  Family History  Problem Relation Age of Onset  . Hyperlipidemia Mother   . Hypertension Mother   . Arthritis Mother   . Hypertension Father   . Hyperlipidemia Father   . Heart disease Father        MI  . Hypertension Brother   . Heart disease Brother        MI    Social History   Socioeconomic History  . Marital status: Legally Separated    Spouse name: Not on file  . Number of children: Not on file  . Years of education: Not on file  . Highest education level: Not on file  Occupational History  . Not on file  Social Needs  . Financial resource strain: Not on file  . Food insecurity:    Worry: Not on file    Inability: Not on file  . Transportation needs:    Medical: Not on file    Non-medical: Not on file  Tobacco Use  . Smoking status: Never Smoker  . Smokeless tobacco: Never Used  Substance and Sexual Activity  . Alcohol use: Yes    Alcohol/week: 1.0 standard drinks    Types: 1 Cans of beer per week  . Drug use: No  . Sexual activity: Not on file    Comment: lives alon, no dietary restrictions,  works Quarry manager job  Lifestyle  . Physical activity:    Days per week: Not on file    Minutes per session: Not on file  . Stress: Not on file  Relationships  . Social connections:    Talks on phone: Not on file    Gets together: Not on file    Attends religious service: Not on file    Active member of club or organization: Not on file    Attends meetings of clubs or organizations: Not on file    Relationship status: Not on file  . Intimate partner violence:    Fear of current or ex partner: Not on file    Emotionally abused: Not on file    Physically abused: Not on file    Forced sexual activity: Not on file  Other Topics Concern  . Not on file  Social History Narrative  . Not on file    Outpatient Medications Prior to Visit  Medication Sig Dispense Refill  . amLODipine (NORVASC) 10 MG tablet Take 1 tablet (10 mg total) by mouth daily. 30 tablet 5  . aspirin EC 81 MG tablet Take 81 mg by mouth  daily.    . escitalopram (LEXAPRO) 10 MG tablet 1/2 tab po daily x 7 days then increase to 1 tab po daily 30 tablet 3  . fenofibrate (TRICOR) 145 MG tablet TAKE ONE TABLET BY MOUTH ONCE DAILY 30 tablet 5  . losartan-hydrochlorothiazide (HYZAAR) 100-25 MG tablet Take 1 tablet by mouth daily. 30 tablet 5  . meloxicam (MOBIC) 15 MG tablet Take 1 tablet (15 mg total) by mouth daily. 30 tablet 3  . nitroGLYCERIN (NITRODUR - DOSED IN MG/24 HR) 0.2 mg/hr patch Apply 1/4th patch to affected ankle, change daily 30 patch 1  . Omega-3 Fatty Acids (FISH OIL OMEGA-3 PO) Take 1 tablet by mouth daily.    Marland Kitchen omeprazole (PRILOSEC) 40 MG capsule Take 1 capsule (40 mg total) by mouth daily. 30 capsule 5  . potassium chloride SA (K-DUR,KLOR-CON) 20 MEQ tablet Take 2 tablets daily for 5 days, then take 1 tablet by mouth daily 35 tablet 0  . rosuvastatin (CRESTOR) 20 MG tablet TAKE ONE TABLET BY MOUTH ONCE DAILY 30 tablet 5  . tiZANidine (ZANAFLEX) 4 MG tablet Take 1 tablet (4 mg total) by mouth  every 6 (six) hours as needed for muscle spasms. 40 tablet 1  . traMADol (ULTRAM) 50 MG tablet Take 1 tablet (50 mg total) by mouth every 12 (twelve) hours as needed for moderate pain or severe pain. Dz:M54.5 Back Pain 60 tablet 0  . carisoprodol (SOMA) 350 MG tablet Take 1 tablet (350 mg total) by mouth 3 (three) times daily as needed for muscle spasms. (Patient not taking: Reported on 02/21/2018) 30 tablet 1  . methylPREDNISolone (MEDROL) 4 MG tablet 5 tab po qd X 1d then 4 tab po qd X 1d then 3 tab po qd X 1d then 2 tab po qd then 1 tab po qd (Patient not taking: Reported on 02/21/2018) 15 tablet 0   No facility-administered medications prior to visit.     Allergies  Allergen Reactions  . Penicillins Swelling    Review of Systems  Constitutional: Negative for fever and malaise/fatigue.  HENT: Negative for congestion.   Eyes: Negative for blurred vision.  Respiratory: Negative for shortness of breath.   Cardiovascular: Negative for chest pain, palpitations and leg swelling.  Gastrointestinal: Negative for abdominal pain, blood in stool and nausea.  Genitourinary: Negative for dysuria and frequency.  Musculoskeletal: Positive for joint pain. Negative for falls.  Skin: Negative for rash.  Neurological: Negative for dizziness, loss of consciousness and headaches.  Endo/Heme/Allergies: Negative for environmental allergies.  Psychiatric/Behavioral: Negative for depression. The patient is not nervous/anxious.        Objective:    Physical Exam  Constitutional: He is oriented to person, place, and time. He appears well-developed and well-nourished. No distress.  HENT:  Head: Normocephalic and atraumatic.  Nose: Nose normal.  Eyes: Right eye exhibits no discharge. Left eye exhibits no discharge.  Neck: Normal range of motion. Neck supple.  Cardiovascular: Normal rate and regular rhythm.  No murmur heard. Pulmonary/Chest: Effort normal and breath sounds normal.  Abdominal: Soft. Bowel  sounds are normal. There is no tenderness.  Musculoskeletal: He exhibits no edema.  Neurological: He is alert and oriented to person, place, and time.  Skin: Skin is warm and dry.  Psychiatric: He has a normal mood and affect.  Nursing note and vitals reviewed.   BP 138/87 (BP Location: Left Arm, Patient Position: Sitting, Cuff Size: Large)   Pulse 66   Temp 98.4 F (36.9 C) (Oral)  Ht 5\' 5"  (1.651 m)   Wt 235 lb 3.2 oz (106.7 kg)   SpO2 99%   BMI 39.14 kg/m  Wt Readings from Last 3 Encounters:  02/21/18 235 lb 3.2 oz (106.7 kg)  11/12/17 236 lb (107 kg)  11/12/16 233 lb 3.2 oz (105.8 kg)     Lab Results  Component Value Date   WBC 8.8 02/21/2018   HGB 13.5 02/21/2018   HCT 40.0 02/21/2018   PLT 335.0 02/21/2018   GLUCOSE 100 (H) 02/21/2018   CHOL 207 (H) 02/21/2018   TRIG 175.0 (H) 02/21/2018   HDL 27.10 (L) 02/21/2018   LDLCALC 145 (H) 02/21/2018   ALT 38 02/21/2018   AST 29 02/21/2018   NA 138 02/21/2018   K 4.3 02/21/2018   CL 102 02/21/2018   CREATININE 1.05 02/21/2018   BUN 13 02/21/2018   CO2 28 02/21/2018   TSH 3.68 02/21/2018   PSA 0.9 07/26/2014   HGBA1C 6.2 02/21/2018    Lab Results  Component Value Date   TSH 3.68 02/21/2018   Lab Results  Component Value Date   WBC 8.8 02/21/2018   HGB 13.5 02/21/2018   HCT 40.0 02/21/2018   MCV 81.9 02/21/2018   PLT 335.0 02/21/2018   Lab Results  Component Value Date   NA 138 02/21/2018   K 4.3 02/21/2018   CO2 28 02/21/2018   GLUCOSE 100 (H) 02/21/2018   BUN 13 02/21/2018   CREATININE 1.05 02/21/2018   BILITOT 0.6 02/21/2018   ALKPHOS 64 02/21/2018   AST 29 02/21/2018   ALT 38 02/21/2018   PROT 7.4 02/21/2018   ALBUMIN 4.4 02/21/2018   CALCIUM 9.6 02/21/2018   GFR 81.15 02/21/2018   Lab Results  Component Value Date   CHOL 207 (H) 02/21/2018   Lab Results  Component Value Date   HDL 27.10 (L) 02/21/2018   Lab Results  Component Value Date   LDLCALC 145 (H) 02/21/2018   Lab  Results  Component Value Date   TRIG 175.0 (H) 02/21/2018   Lab Results  Component Value Date   CHOLHDL 8 02/21/2018   Lab Results  Component Value Date   HGBA1C 6.2 02/21/2018       Assessment & Plan:   Problem List Items Addressed This Visit    Hypertension    Well controlled, no changes to meds. Encouraged heart healthy diet such as the DASH diet and exercise as tolerated.       Relevant Orders   CBC (Completed)   Comprehensive metabolic panel (Completed)   TSH (Completed)   Hyperlipidemia with target LDL less than 100    Encouraged heart healthy diet, increase exercise, avoid trans fats, consider a krill oil cap daily      Relevant Orders   Lipid panel (Completed)   Hyperglycemia    hgba1c acceptable, minimize simple carbs. Increase exercise as tolerated.       Relevant Orders   Hemoglobin A1c (Completed)    Other Visit Diagnoses    Allergic reaction to bee sting    -  Primary   Low back pain, unspecified back pain laterality, unspecified chronicity, with sciatica presence unspecified       Relevant Medications   traMADol (ULTRAM) 50 MG tablet   tiZANidine (ZANAFLEX) 4 MG tablet   Chronic pain of left ankle       Relevant Orders   Ambulatory referral to Podiatry      I have discontinued Earl Lites Gunderman's carisoprodol and methylPREDNISolone. I  have also changed his traMADol. Additionally, I am having him start on EPINEPHrine. Lastly, I am having him maintain his aspirin EC, escitalopram, Omega-3 Fatty Acids (FISH OIL OMEGA-3 PO), nitroGLYCERIN, potassium chloride SA, meloxicam, losartan-hydrochlorothiazide, amLODipine, omeprazole, rosuvastatin, fenofibrate, and tiZANidine.  Meds ordered this encounter  Medications  . EPINEPHrine (EPIPEN 2-PAK) 0.3 mg/0.3 mL IJ SOAJ injection    Sig: Inject 0.3 mLs (0.3 mg total) into the muscle once for 1 dose.    Dispense:  1 Device    Refill:  1  . traMADol (ULTRAM) 50 MG tablet    Sig: Take 1-2 tablets (50-100 mg total)  by mouth every 12 (twelve) hours as needed for moderate pain or severe pain. Dz:M54.5 Back Pain    Dispense:  120 tablet    Refill:  0  . tiZANidine (ZANAFLEX) 4 MG tablet    Sig: Take 1 tablet (4 mg total) by mouth every 6 (six) hours as needed for muscle spasms.    Dispense:  40 tablet    Refill:  1     Danise EdgeStacey Cisco Kindt, MD

## 2018-03-01 ENCOUNTER — Other Ambulatory Visit: Payer: Self-pay | Admitting: Podiatry

## 2018-03-01 ENCOUNTER — Ambulatory Visit (INDEPENDENT_AMBULATORY_CARE_PROVIDER_SITE_OTHER): Payer: BLUE CROSS/BLUE SHIELD

## 2018-03-01 ENCOUNTER — Encounter: Payer: Self-pay | Admitting: Podiatry

## 2018-03-01 ENCOUNTER — Ambulatory Visit: Payer: BLUE CROSS/BLUE SHIELD | Admitting: Podiatry

## 2018-03-01 DIAGNOSIS — M76822 Posterior tibial tendinitis, left leg: Secondary | ICD-10-CM

## 2018-03-01 DIAGNOSIS — M779 Enthesopathy, unspecified: Secondary | ICD-10-CM

## 2018-03-01 MED ORDER — DICLOFENAC SODIUM 75 MG PO TBEC: 75.0000 mg | DELAYED_RELEASE_TABLET | Freq: Two times a day (BID) | ORAL | 1 refills | Status: DC

## 2018-03-01 MED ORDER — METHYLPREDNISOLONE 4 MG PO TBPK: ORAL_TABLET | ORAL | 0 refills | Status: DC

## 2018-03-05 NOTE — Progress Notes (Signed)
    HPI: 47 year old male presenting today with a chief complaint of constant sharp, stabbing pain in the left foot, heel and ankle that began about 4 years ago. He states he hears popping sounds from the foot and ankle. He reports some intermittent associated swelling. Walking and standing for long periods of time increases the pain. He has applied Nitroglycerin patches and tried ice therapy with some temporary relief. Patient is here for further evaluation and treatment.   Past Medical History:  Diagnosis Date  . Gout 04/29/2017  . Headache 11/12/2016  . Hearing loss in right ear 03/22/2015   Secondary to Scarlet Fever as child  . High cholesterol   . Hypertension   . Obesity 03/22/2015  . Preventative health care 11/12/2016       Physical Exam: General: The patient is alert and oriented x3 in no acute distress.  Dermatology: Skin is warm, dry and supple bilateral lower extremities. Negative for open lesions or macerations.  Vascular: Palpable pedal pulses bilaterally. No edema or erythema noted. Capillary refill within normal limits.  Neurological: Epicritic and protective threshold grossly intact bilaterally.   Musculoskeletal Exam: Pain on palpation noted to the posterior tibial tendon of the left foot. Range of motion within normal limits. Muscle strength 5/5 in all muscle groups bilateral lower extremities.  Radiographic Exam:  Normal osseous mineralization. Joint spaces preserved. No fracture or dislocation identified.    Assessment: 1. Posterior tibial tendinitis left   Plan of Care:  1. Patient was evaluated. Radiographs were reviewed today. 2. Injection of 0.5 mL Celestone Soluspan injected into the posterior tibial tendon sheath.  3. Prescription for Medrol Dose Pak provided to patient.  4. Prescription for Diclofenac provided to patient.  5. Plantar fascial brace dispensed.  6. Return to clinic in 4 weeks. May need MRI if no improvement.   Works for [employer].     Felecia ShellingBrent M. Fedrick Cefalu, DPM [facility]  Dr. Felecia ShellingBrent M. Barba Solt, DPM    [address]                                        [place], [place] [address]                Office [phone]  Fax [phone]

## 2018-03-31 ENCOUNTER — Ambulatory Visit: Payer: BLUE CROSS/BLUE SHIELD | Admitting: Podiatry

## 2018-03-31 ENCOUNTER — Encounter: Payer: Self-pay | Admitting: Sports Medicine

## 2018-03-31 ENCOUNTER — Ambulatory Visit (INDEPENDENT_AMBULATORY_CARE_PROVIDER_SITE_OTHER): Payer: BLUE CROSS/BLUE SHIELD | Admitting: Sports Medicine

## 2018-03-31 DIAGNOSIS — M25572 Pain in left ankle and joints of left foot: Secondary | ICD-10-CM

## 2018-03-31 DIAGNOSIS — M76822 Posterior tibial tendinitis, left leg: Secondary | ICD-10-CM

## 2018-03-31 DIAGNOSIS — S96912A Strain of unspecified muscle and tendon at ankle and foot level, left foot, initial encounter: Secondary | ICD-10-CM | POA: Diagnosis not present

## 2018-03-31 NOTE — Patient Instructions (Addendum)
For instructions on how to put on your Cam Walking Boot, please visit [email or web address]   Walking Boot, Adult A walking boot is a medical device that holds your foot or ankle in place after an injury or a medical procedure. This helps with healing and prevents further injury. A walking boot is a removable boot-shaped splint. It has a hard, rigid outer frame that limits movement and supports your foot and leg. The inner lining is a layer of padded material. Walking boots usually have adjustable straps to secure them over the foot and leg. Your health care provider may prescribe a walking boot if you can put weight (bear weight) on your injured foot. How much you can walk while wearing the boot will depend on the type and severity of your injury. Your health care provider will recommend the best boot for you based on your condition. How do I put on my walking boot? There are different types of walking boots. Each type of boot has specific instructions about how to wear it properly. Follow instructions from your health care provider about wearing your boot. In general:  Ask someone to help you put on the boot, if needed.  Sit to put on your boot. Doing this is more comfortable and it helps to prevent falls.  Open up the boot fully. Place your foot into the boot so your heel rests against the back.  Your toes should be supported by the base of the boot. They should not hang over the front edge.  Adjust the straps so the boot fits securely but is not too tight.  Do not bend the hard frame of the boot to get a good fit.  What are some tips for walking with a walking boot?  Do not try to walk without wearing the boot unless your health care provider has approved.  Use other assistive walking devices, including crutches and canes, as told by your health care provider.  On your uninjured foot, wear a shoe with a heel that is close to the height of the walking boot.  Be careful when walking  on surfaces that are uneven or wet. How can I reduce swelling while using a walking boot?  Rest your injured foot or leg as much as possible.  If directed, apply ice to the injured area: ? Put ice in a plastic bag. ? Place a towel between your skin and the bag. ? Leave the ice on for 20 minutes, 2-3 times a day.  Keep your injured foot or leg raised (elevated) above the level of your heart whenever able. Try to do this for at least 2?3 hours each day or as told by your health care provider.  If swelling gets worse, loosen the boot and rest and elevate your foot and leg. How should I take care of my skin and foot while using a walking boot?  Wear a long sock to protect your foot and leg from rubbing inside the boot.  Take off the boot one time each day to check the injured area. Look at your foot, surrounding skin, and leg to make sure there are no sores, rashes, swelling, or wounds. The skin should be a healthy color, not pale or blue.  Try to notice if your walking pattern (gait) in the boot is fairly normal and that you are not walking with a noticeable limp.  Follow instructions from your health care provider about taking care of your incision or wound, if this applies.  Clean  and wash the injured area as told by your health care provider.  Gently dry your foot and leg before putting the boot back on. Are there any activity restrictions? Activity restrictions depend on the type and severity of your injury. Follow instructions from your health care provider about limiting activities.  Bathe and shower as told by your health care provider.  Do not do any activities that could make your injury worse.  Do not drive if your affected foot is one that you usually use for driving.  When can I remove my walking boot? Always follow specific directions from your health care provider for removing the walking boot. Generally, it is okay to remove your walking boot:  At the end of the day  when you are resting or sleeping.  To clean your foot and leg.  How should I keep my walking boot clean?  Do not put any part of the boot in a washing machine or dryer.  Do not use chemical cleaning products. These could irritate your skin, especially if you have a wound or an incision.  Do not soak the liner of the boot.  Use a washcloth with mild soap and water to clean the frame and the liner of the boot by hand.  Allow the boot to air-dry completely before you put it back on your foot. Contact a health care provider if:  The boot is cracked or damaged.  The boot does not fit properly.  Your foot or leg hurts.  You have a rash, sore, or open sore (ulcer) on your foot or leg.  The skin on your foot or leg is pale.  You have a wound or incision on the foot and it is getting worse.  Your skin becomes painful, red, or irritated.  Your swelling does not get better or it gets worse. Get help right away if:  You cannot feel your foot or leg (have numbness).  You cannot feel a pulse at the top of your foot, where your foot and ankle meet.  Your skin on the foot or leg is cold, blue, or gray. Summary  A walking boot holds your foot or ankle in place after an injury or a medical procedure.  There are different types of walking boots. Follow the specific instructions about how to correctly wear the boot that you have.  Ask someone to help you put on the boot, if needed.  It is important to check your skin and foot every day. Call your health care provider if you notice a rash or sore on your foot or leg. This information is not intended to replace advice given to you by your health care provider. Make sure you discuss any questions you have with your health care provider. Document Released: 11/16/2014 Document Revised: 08/09/2016 Document Reviewed: 08/09/2016 Elsevier Interactive Patient Education  [employer].

## 2018-03-31 NOTE — Progress Notes (Signed)
Subjective: Jason Harper is a 47 y.o. male patient who returns to office for follow up evaluation of Left foot pain. Patient now ranks pain 7/10 and states that he is was doing good initially after he got the injection and has taken the meds but now the pain has returned patient also admits to swelling and by the end of the day after work his foot is very painful.  Patient reports that nothing has helped thus far and has been compliant with using his brace with his tennis shoe and has also been icing with still recurrent symptoms.  Patient denies any other pedal complaints.  Denies redness, warmth, open wound, clicking, and grinding but does admit to a history of a fall 4 years ago and states that sometimes he can hear a popping sound with pain and swelling that is associated that increases with weightbearing and activity.  No other issues noted.  Patient Active Problem List   Diagnosis Date Noted  . Dysuria 04/29/2017  . Headache 11/12/2016  . Preventative health care 11/12/2016  . Obesity 03/22/2015  . Hearing loss in right ear 03/22/2015  . Dyspnea on exertion 03/22/2015  . Hyperglycemia 03/22/2015  . Pain in joint, ankle and foot 03/22/2015  . GERD (gastroesophageal reflux disease) 10/05/2013  . Hypertension 04/01/2013  . Hyperlipidemia with target LDL less than 100 04/01/2013   Current Outpatient Medications on File Prior to Visit  Medication Sig Dispense Refill  . amLODipine (NORVASC) 10 MG tablet Take 1 tablet (10 mg total) by mouth daily. 30 tablet 5  . aspirin EC 81 MG tablet Take 81 mg by mouth daily.    . diclofenac (VOLTAREN) 75 MG EC tablet Take 1 tablet (75 mg total) by mouth 2 (two) times daily. 60 tablet 1  . EPINEPHrine 0.3 mg/0.3 mL IJ SOAJ injection INJECT CONTENTS OF 1 PEN AS NEEDED FOR ALLERGIC REACTION  1  . escitalopram (LEXAPRO) 10 MG tablet 1/2 tab po daily x 7 days then increase to 1 tab po daily 30 tablet 3  . fenofibrate (TRICOR) 145 MG tablet TAKE ONE TABLET BY  MOUTH ONCE DAILY 30 tablet 5  . losartan-hydrochlorothiazide (HYZAAR) 100-25 MG tablet Take 1 tablet by mouth daily. 30 tablet 5  . methylPREDNISolone (MEDROL DOSEPAK) 4 MG TBPK tablet 6 day dose pack - take as directed 21 tablet 0  . nitroGLYCERIN (NITRODUR - DOSED IN MG/24 HR) 0.2 mg/hr patch Apply 1/4th patch to affected ankle, change daily 30 patch 1  . Omega-3 Fatty Acids (FISH OIL OMEGA-3 PO) Take 1 tablet by mouth daily.    Marland Kitchen omeprazole (PRILOSEC) 40 MG capsule Take 1 capsule (40 mg total) by mouth daily. 30 capsule 5  . potassium chloride SA (K-DUR,KLOR-CON) 20 MEQ tablet Take 2 tablets daily for 5 days, then take 1 tablet by mouth daily 35 tablet 0  . rosuvastatin (CRESTOR) 20 MG tablet TAKE ONE TABLET BY MOUTH ONCE DAILY 30 tablet 5  . tiZANidine (ZANAFLEX) 4 MG tablet Take 1 tablet (4 mg total) by mouth every 6 (six) hours as needed for muscle spasms. 40 tablet 1  . traMADol (ULTRAM) 50 MG tablet Take 1-2 tablets (50-100 mg total) by mouth every 12 (twelve) hours as needed for moderate pain or severe pain. Dz:M54.5 Back Pain 120 tablet 0   No current facility-administered medications on file prior to visit.    Allergies  Allergen Reactions  . Penicillins Swelling    Objective:  General: Alert and oriented x3 in no acute  distress  Dermatology: No open lesions bilateral lower extremities, no webspace macerations, no ecchymosis bilateral, all nails x 10 are well manicured.  Neurovascular: Intact. No lower extremity edema bilateral.  Subjectively patient experiences swelling by the end of the day.  Musculoskeletal: There is tenderness to palpation at the medial foot and ankle along the posterior tibial tendon course very minimal pain to the lateral ankle at today's visit, range of motion in all pedal joints are within normal limits without pain or crepitation however there is mild guarding at the left foot and ankle due to pain. Strength difficult to discern due to patient guarding  due to pain.  Assessment and Plan: Problem List Items Addressed This Visit    None    Visit Diagnoses    Posterior tibial tendinitis, left    -  Primary   Tendon tear, ankle, left, initial encounter       Pain in joint involving left ankle and foot           -Complete examination performed -Discussed treatement options for possible recurrent posterior tibial tendinitis in the setting of pes planus versus possible tear since pain is ongoing and chronic -Rx MRI for further evaluation -Dispense cam boot to wear at all times and advised patient if his job does not allow him to wear the cam boot and can return to tennis shoe with brace however outside of work must return to [facility] boot to prevent further injury of further pain or symptoms -May continue with rest, ice, elevation and diclofenac -Patient to return to office to discuss MRI results with Dr. Logan BoresEvans or sooner if condition worsens.  Asencion Islamitorya Chelsie Burel, DPM

## 2018-04-01 ENCOUNTER — Telehealth: Payer: Self-pay | Admitting: *Deleted

## 2018-04-01 DIAGNOSIS — S96912A Strain of unspecified muscle and tendon at ankle and foot level, left foot, initial encounter: Secondary | ICD-10-CM

## 2018-04-01 DIAGNOSIS — M25572 Pain in left ankle and joints of left foot: Secondary | ICD-10-CM

## 2018-04-01 DIAGNOSIS — M76822 Posterior tibial tendinitis, left leg: Secondary | ICD-10-CM

## 2018-04-01 NOTE — Telephone Encounter (Signed)
Orders to J. Quintana, RN for pre-cert, faxed to [facility]. 

## 2018-04-01 NOTE — Addendum Note (Signed)
Addended by: Alphia Kava'CONNELL, VALERY D on: 04/01/2018 01:51 PM   Modules accepted: Orders

## 2018-04-01 NOTE — Telephone Encounter (Signed)
[facility]. [facility]. I know where it is at. Thank you.

## 2018-04-01 NOTE — Telephone Encounter (Signed)
Left message requesting call back if he would like the MRI at [facility] or [place].

## 2018-04-01 NOTE — Telephone Encounter (Signed)
-----   Message from Asencion Islamitorya [place]over, [place] sent at 03/31/2018 11:52 AM EDT ----- Regarding: MRI Left ankle (DR EVANS Patient) Recurrent pain in left ankle most painful at posterior tibial tendon R/o tear since pain has been going on since a fall 4 years ago and not improved with brace, injection, and meds

## 2018-04-14 ENCOUNTER — Ambulatory Visit
Admission: RE | Admit: 2018-04-14 | Discharge: 2018-04-14 | Disposition: A | Discharge status: Home / Self Care | Payer: BLUE CROSS/BLUE SHIELD | Source: Ambulatory Visit | Attending: Sports Medicine | Admitting: Sports Medicine

## 2018-04-14 DIAGNOSIS — M76822 Posterior tibial tendinitis, left leg: Secondary | ICD-10-CM | POA: Diagnosis not present

## 2018-05-30 ENCOUNTER — Ambulatory Visit (INDEPENDENT_AMBULATORY_CARE_PROVIDER_SITE_OTHER): Payer: BLUE CROSS/BLUE SHIELD | Admitting: Family Medicine

## 2018-05-30 VITALS — BP 120/84 | HR 82 | Temp 97.7°F | Resp 18 | Ht 65.0 in | Wt 233.6 lb

## 2018-05-30 DIAGNOSIS — R300 Dysuria: Secondary | ICD-10-CM | POA: Diagnosis not present

## 2018-05-30 DIAGNOSIS — R739 Hyperglycemia, unspecified: Secondary | ICD-10-CM | POA: Diagnosis not present

## 2018-05-30 DIAGNOSIS — E785 Hyperlipidemia, unspecified: Secondary | ICD-10-CM

## 2018-05-30 DIAGNOSIS — M545 Low back pain, unspecified: Secondary | ICD-10-CM

## 2018-05-30 DIAGNOSIS — K219 Gastro-esophageal reflux disease without esophagitis: Secondary | ICD-10-CM | POA: Diagnosis not present

## 2018-05-30 DIAGNOSIS — I10 Essential (primary) hypertension: Secondary | ICD-10-CM | POA: Diagnosis not present

## 2018-05-30 DIAGNOSIS — J40 Bronchitis, not specified as acute or chronic: Secondary | ICD-10-CM

## 2018-05-30 DIAGNOSIS — M25572 Pain in left ankle and joints of left foot: Secondary | ICD-10-CM

## 2018-05-30 LAB — CBC WITH DIFFERENTIAL/PLATELET
BASOS PCT: 0.9 % (ref 0.0–3.0)
Basophils Absolute: 0.1 10*3/uL (ref 0.0–0.1)
EOS PCT: 6 % — AB (ref 0.0–5.0)
Eosinophils Absolute: 0.7 10*3/uL (ref 0.0–0.7)
HCT: 42.7 % (ref 39.0–52.0)
HEMOGLOBIN: 14.6 g/dL (ref 13.0–17.0)
Lymphocytes Relative: 45.9 % (ref 12.0–46.0)
Lymphs Abs: 5.3 10*3/uL — ABNORMAL HIGH (ref 0.7–4.0)
MCHC: 34.3 g/dL (ref 30.0–36.0)
MCV: 81.4 fl (ref 78.0–100.0)
MONOS PCT: 7.8 % (ref 3.0–12.0)
Monocytes Absolute: 0.9 10*3/uL (ref 0.1–1.0)
Neutro Abs: 4.5 10*3/uL (ref 1.4–7.7)
Neutrophils Relative %: 39.4 % — ABNORMAL LOW (ref 43.0–77.0)
Platelets: 313 10*3/uL (ref 150.0–400.0)
RBC: 5.25 Mil/uL (ref 4.22–5.81)
RDW: 13.7 % (ref 11.5–15.5)
WBC: 11.5 10*3/uL — AB (ref 4.0–10.5)

## 2018-05-30 LAB — COMPREHENSIVE METABOLIC PANEL
ALBUMIN: 4.5 g/dL (ref 3.5–5.2)
ALT: 35 U/L (ref 0–53)
AST: 31 U/L (ref 0–37)
Alkaline Phosphatase: 67 U/L (ref 39–117)
BUN: 9 mg/dL (ref 6–23)
CALCIUM: 9.4 mg/dL (ref 8.4–10.5)
CHLORIDE: 100 meq/L (ref 96–112)
CO2: 29 mEq/L (ref 19–32)
Creatinine, Ser: 0.91 mg/dL (ref 0.40–1.50)
GFR: 95.6 mL/min (ref >60.00)
Glucose, Bld: 105 mg/dL — ABNORMAL HIGH (ref 70–99)
POTASSIUM: 3.6 meq/L (ref 3.5–5.1)
Sodium: 138 mEq/L (ref 135–145)
Total Bilirubin: 0.8 mg/dL (ref 0.2–1.2)
Total Protein: 7.4 g/dL (ref 6.0–8.3)

## 2018-05-30 LAB — TSH: TSH: 2.99 u[IU]/mL (ref 0.35–4.50)

## 2018-05-30 LAB — URINALYSIS
Bilirubin Urine: NEGATIVE
HGB URINE DIPSTICK: NEGATIVE
Ketones, ur: NEGATIVE
Leukocytes, UA: NEGATIVE
NITRITE: NEGATIVE
SPECIFIC GRAVITY, URINE: 1.01 (ref 1.000–1.030)
TOTAL PROTEIN, URINE-UPE24: NEGATIVE
Urine Glucose: NEGATIVE
Urobilinogen, UA: 0.2 (ref 0.0–1.0)
pH: 7 (ref 5.0–8.0)

## 2018-05-30 LAB — HEMOGLOBIN A1C: Hgb A1c MFr Bld: 5.6 % (ref 4.6–6.5)

## 2018-05-30 LAB — LIPID PANEL
CHOLESTEROL: 238 mg/dL — AB (ref 0–200)
HDL: 27.9 mg/dL — AB (ref >39.00)
NonHDL: 210.56
TRIGLYCERIDES: 205 mg/dL — AB (ref 0.0–149.0)
Total CHOL/HDL Ratio: 9
VLDL: 41 mg/dL — AB (ref 0.0–40.0)

## 2018-05-30 LAB — LDL CHOLESTEROL, DIRECT: LDL DIRECT: 176 mg/dL

## 2018-05-30 MED ORDER — PHENAZOPYRIDINE HCL 200 MG PO TABS: 200.0000 mg | ORAL_TABLET | Freq: Three times a day (TID) | ORAL | 0 refills | Status: DC | PRN

## 2018-05-30 MED ORDER — TRAMADOL HCL 50 MG PO TABS: 50.0000 mg | ORAL_TABLET | Freq: Two times a day (BID) | ORAL | 0 refills | Status: DC | PRN

## 2018-05-30 MED ORDER — SULFAMETHOXAZOLE-TRIMETHOPRIM 800-160 MG PO TABS: 1.0000 | ORAL_TABLET | Freq: Two times a day (BID) | ORAL | 0 refills | Status: DC

## 2018-05-30 NOTE — Patient Instructions (Signed)

## 2018-05-30 NOTE — Assessment & Plan Note (Signed)
Left foot pain persists had an MRI recently and will follow up with podiatry

## 2018-05-30 NOTE — Assessment & Plan Note (Signed)
Well controlled, no changes to meds. Encouraged heart healthy diet such as the DASH diet and exercise as tolerated.  °

## 2018-05-30 NOTE — Assessment & Plan Note (Signed)
hgba1c acceptable, minimize simple carbs. Increase exercise as tolerated. Continue current meds 

## 2018-05-30 NOTE — Assessment & Plan Note (Signed)
Avoid offending foods, start probiotics. Do not eat large meals in late evening and consider raising head of bed.  

## 2018-05-30 NOTE — Assessment & Plan Note (Signed)
Encouraged heart healthy diet, increase exercise, avoid trans fats, consider a krill oil cap daily 

## 2018-05-31 LAB — URINE CULTURE
MICRO NUMBER:: 91378667
Result:: NO GROWTH
SPECIMEN QUALITY:: ADEQUATE

## 2018-06-01 DIAGNOSIS — J40 Bronchitis, not specified as acute or chronic: Secondary | ICD-10-CM | POA: Insufficient documentation

## 2018-06-01 NOTE — Assessment & Plan Note (Signed)
UA and culture negative, increase hydration

## 2018-06-01 NOTE — Assessment & Plan Note (Signed)
Encouraged increased rest and hydration, add probiotics, zinc such as Coldeze or Xicam. Treat fevers as needed. Started on antibiotics and Mucinex. 

## 2018-06-01 NOTE — Progress Notes (Signed)
Subjective:    Patient ID: Jason Harper, male    DOB: Mar 16, 1971, 47 y.o.   MRN: [id number]  Chief Complaint  Patient presents with  . Follow-up  . Urinary Tract Infection    Urgency, burning, lower back pain.     HPI Patient is in today for follow-up and is not feeling well today.  Yesterday he began having some urinary urgency frequency burning and even some low back pain.  He denies fevers chills.  He also notes 1 to 2 weeks worth of cough, head congestion.  Cough is productive of yellow phlegm.  He has some ear pressure and discomfort.  He denies any headache or sore throat. Denies CP/palp/SOB/HA/congestion/fevers/GI or GU c/o. Taking meds as prescribed  Past Medical History:  Diagnosis Date  . Gout 04/29/2017  . Headache 11/12/2016  . Hearing loss in right ear 03/22/2015   Secondary to Scarlet Fever as child  . High cholesterol   . Hypertension   . Obesity 03/22/2015  . Preventative health care 11/12/2016    No past surgical history on file.  Family History  Problem Relation Age of Onset  . Hyperlipidemia Mother   . Hypertension Mother   . Arthritis Mother   . Hypertension Father   . Hyperlipidemia Father   . Heart disease Father        MI  . Hypertension Brother   . Heart disease Brother        MI    Social History   Socioeconomic History  . Marital status: Legally Separated    Spouse name: Not on file  . Number of children: Not on file  . Years of education: Not on file  . Highest education level: Not on file  Occupational History  . Not on file  Social Needs  . Financial resource strain: Not on file  . Food insecurity:    Worry: Not on file    Inability: Not on file  . Transportation needs:    Medical: Not on file    Non-medical: Not on file  Tobacco Use  . Smoking status: Never Smoker  . Smokeless tobacco: Never Used  Substance and Sexual Activity  . Alcohol use: Yes    Alcohol/week: 1.0 standard drinks    Types: 1 Cans of beer per week  . Drug  use: No  . 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 Physical activity:    Days per week: Not on file    Minutes per session: Not on file  . Stress: Not on file  Relationships  . Social connections:    Talks on phone: Not on file    Gets together: Not on file    Attends religious service: Not on file    Active member of club or organization: Not on file    Attends meetings of clubs or organizations: Not on file    Relationship status: Not on file  . Intimate partner violence:    Fear of current or ex partner: Not on file    Emotionally abused: Not on file    Physically abused: Not on file    Forced sexual activity: Not on file  Other Topics Concern  . Not on file  Social History Narrative  . Not on file    Outpatient Medications Prior to Visit  Medication Sig Dispense Refill  . amLODipine (NORVASC) 10 MG tablet Take 1 tablet (10 mg  total) by mouth daily. 30 tablet 5  . aspirin EC 81 MG tablet Take 81 mg by mouth daily.    . diclofenac (VOLTAREN) 75 MG EC tablet Take 1 tablet (75 mg total) by mouth 2 (two) times daily. 60 tablet 1  . EPINEPHrine 0.3 mg/0.3 mL IJ SOAJ injection INJECT CONTENTS OF 1 PEN AS NEEDED FOR ALLERGIC REACTION  1  . escitalopram (LEXAPRO) 10 MG tablet 1/2 tab po daily x 7 days then increase to 1 tab po daily 30 tablet 3  . fenofibrate (TRICOR) 145 MG tablet TAKE ONE TABLET BY MOUTH ONCE DAILY 30 tablet 5  . losartan-hydrochlorothiazide (HYZAAR) 100-25 MG tablet Take 1 tablet by mouth daily. 30 tablet 5  . methylPREDNISolone (MEDROL DOSEPAK) 4 MG TBPK tablet 6 day dose pack - take as directed 21 tablet 0  . nitroGLYCERIN (NITRODUR - DOSED IN MG/24 HR) 0.2 mg/hr patch Apply 1/4th patch to affected ankle, change daily 30 patch 1  . Omega-3 Fatty Acids (FISH OIL OMEGA-3 PO) Take 1 tablet by mouth daily.    Marland Kitchen. omeprazole (PRILOSEC) 40 MG capsule Take 1 capsule (40 mg total) by  mouth daily. 30 capsule 5  . potassium chloride SA (K-DUR,KLOR-CON) 20 MEQ tablet Take 2 tablets daily for 5 days, then take 1 tablet by mouth daily 35 tablet 0  . rosuvastatin (CRESTOR) 20 MG tablet TAKE ONE TABLET BY MOUTH ONCE DAILY 30 tablet 5  . tiZANidine (ZANAFLEX) 4 MG tablet Take 1 tablet (4 mg total) by mouth every 6 (six) hours as needed for muscle spasms. 40 tablet 1  . traMADol (ULTRAM) 50 MG tablet Take 1-2 tablets (50-100 mg total) by mouth every 12 (twelve) hours as needed for moderate pain or severe pain. Dz:M54.5 Back Pain 120 tablet 0   No facility-administered medications prior to visit.     Allergies  Allergen Reactions  . Penicillins Swelling    Review of Systems  Constitutional: Positive for malaise/fatigue. Negative for chills and fever.  HENT: Positive for congestion. Negative for hearing loss.   Eyes: Negative for discharge.  Respiratory: Positive for cough and sputum production. Negative for shortness of breath.   Cardiovascular: Negative for chest pain, palpitations and leg swelling.  Gastrointestinal: Negative for abdominal pain, blood in stool, constipation, diarrhea, heartburn, nausea and vomiting.  Genitourinary: Positive for dysuria, frequency and urgency. Negative for hematuria.  Musculoskeletal: Positive for myalgias. Negative for back pain and falls.  Skin: Negative for rash.  Neurological: Negative for dizziness, sensory change, loss of consciousness, weakness and headaches.  Endo/Heme/Allergies: Negative for environmental allergies. Does not bruise/bleed easily.  Psychiatric/Behavioral: Negative for depression and suicidal ideas. The patient is not nervous/anxious and does not have insomnia.        Objective:    Physical Exam  Constitutional: He is oriented to person, place, and time. He appears well-developed and well-nourished. No distress.  HENT:  Head: Normocephalic and atraumatic.  Nose: Nose normal.  TMs erythematous and retracted    Eyes: Right eye exhibits no discharge. Left eye exhibits no discharge.  Neck: Normal range of motion. Neck supple.  Cardiovascular: Normal rate and regular rhythm.  No murmur heard. Pulmonary/Chest: Effort normal and breath sounds normal.  Abdominal: Soft. Bowel sounds are normal. There is no tenderness.  Musculoskeletal: He exhibits no edema.  Neurological: He is alert and oriented to person, place, and time.  Skin: Skin is warm and dry.  Psychiatric: He has a normal mood and affect.  Nursing note and vitals  reviewed.   BP 120/84 (BP Location: Left Arm, Patient Position: Sitting, Cuff Size: Large)   Pulse 82   Temp 97.7 F (36.5 C) (Oral)   Resp 18   Ht 5\' 5"  (1.651 m)   Wt 233 lb 9.6 oz (106 kg)   SpO2 97%   BMI 38.87 kg/m  Wt Readings from Last 3 Encounters:  05/30/18 233 lb 9.6 oz (106 kg)  02/21/18 235 lb 3.2 oz (106.7 kg)  11/12/17 236 lb (107 kg)     Lab Results  Component Value Date   WBC 11.5 (H) 05/30/2018   HGB 14.6 05/30/2018   HCT 42.7 05/30/2018   PLT 313.0 05/30/2018   GLUCOSE 105 (H) 05/30/2018   CHOL 238 (H) 05/30/2018   TRIG 205.0 (H) 05/30/2018   HDL 27.90 (L) 05/30/2018   LDLDIRECT 176.0 05/30/2018   LDLCALC 145 (H) 02/21/2018   ALT 35 05/30/2018   AST 31 05/30/2018   NA 138 05/30/2018   K 3.6 05/30/2018   CL 100 05/30/2018   CREATININE 0.91 05/30/2018   BUN 9 05/30/2018   CO2 29 05/30/2018   TSH 2.99 05/30/2018   PSA 0.9 07/26/2014   HGBA1C 5.6 05/30/2018    Lab Results  Component Value Date   TSH 2.99 05/30/2018   Lab Results  Component Value Date   WBC 11.5 (H) 05/30/2018   HGB 14.6 05/30/2018   HCT 42.7 05/30/2018   MCV 81.4 05/30/2018   PLT 313.0 05/30/2018   Lab Results  Component Value Date   NA 138 05/30/2018   K 3.6 05/30/2018   CO2 29 05/30/2018   GLUCOSE 105 (H) 05/30/2018   BUN 9 05/30/2018   CREATININE 0.91 05/30/2018   BILITOT 0.8 05/30/2018   ALKPHOS 67 05/30/2018   AST 31 05/30/2018   ALT 35 05/30/2018    PROT 7.4 05/30/2018   ALBUMIN 4.5 05/30/2018   CALCIUM 9.4 05/30/2018   GFR 95.60 05/30/2018   Lab Results  Component Value Date   CHOL 238 (H) 05/30/2018   Lab Results  Component Value Date   HDL 27.90 (L) 05/30/2018   Lab Results  Component Value Date   LDLCALC 145 (H) 02/21/2018   Lab Results  Component Value Date   TRIG 205.0 (H) 05/30/2018   Lab Results  Component Value Date   CHOLHDL 9 05/30/2018   Lab Results  Component Value Date   HGBA1C 5.6 05/30/2018       Assessment & Plan:   Problem List Items Addressed This Visit    Hypertension    Well controlled, no changes to meds. Encouraged heart healthy diet such as the DASH diet and exercise as tolerated.       Relevant Orders   CBC with Differential/Platelet (Completed)   TSH (Completed)   Hyperlipidemia with target LDL less than 100    Encouraged heart healthy diet, increase exercise, avoid trans fats, consider a krill oil cap daily      Relevant Orders   Lipid panel (Completed)   GERD (gastroesophageal reflux disease)    Avoid offending foods, start probiotics. Do not eat large meals in late evening and consider raising head of bed.       Hyperglycemia    hgba1c acceptable, minimize simple carbs. Increase exercise as tolerated. Continue current meds      Relevant Orders   Hemoglobin A1c (Completed)   Comprehensive metabolic panel (Completed)   Pain in joint, ankle and foot    Left foot pain persists had  an MRI recently and will follow up with podiatry      Dysuria - Primary    UA and culture negative, increase hydration      Relevant Orders   Urine Culture (Completed)   Urinalysis (Completed)   Bronchitis    Encouraged increased rest and hydration, add probiotics, zinc such as Coldeze or Xicam. Treat fevers as needed. Started on antibiotics and Mucinex.        Other Visit Diagnoses    Low back pain       Relevant Medications   traMADol (ULTRAM) 50 MG tablet   Other Relevant  Orders   Urine Culture (Completed)   Urinalysis (Completed)      I am having Lovette Cliche start on sulfamethoxazole-trimethoprim and phenazopyridine. I am also having him maintain his aspirin EC, escitalopram, Omega-3 Fatty Acids (FISH OIL OMEGA-3 PO), nitroGLYCERIN, potassium chloride SA, losartan-hydrochlorothiazide, amLODipine, omeprazole, rosuvastatin, fenofibrate, tiZANidine, EPINEPHrine, methylPREDNISolone, diclofenac, and traMADol.  Meds ordered this encounter  Medications  . sulfamethoxazole-trimethoprim (BACTRIM DS,SEPTRA DS) 800-160 MG tablet    Sig: Take 1 tablet by mouth 2 (two) times daily.    Dispense:  14 tablet    Refill:  0  . phenazopyridine (PYRIDIUM) 200 MG tablet    Sig: Take 1 tablet (200 mg total) by mouth 3 (three) times daily as needed for pain.    Dispense:  6 tablet    Refill:  0  . traMADol (ULTRAM) 50 MG tablet    Sig: Take 1-2 tablets (50-100 mg total) by mouth every 12 (twelve) hours as needed for moderate pain or severe pain. Dz:M54.5 Back Pain    Dispense:  120 tablet    Refill:  0     Danise Edge, MD

## 2018-07-12 DIAGNOSIS — Z6837 Body mass index (BMI) 37.0-37.9, adult: Secondary | ICD-10-CM | POA: Diagnosis not present

## 2018-07-12 DIAGNOSIS — R197 Diarrhea, unspecified: Secondary | ICD-10-CM | POA: Diagnosis not present

## 2018-09-19 ENCOUNTER — Encounter: Payer: Self-pay | Admitting: Medical

## 2018-09-19 ENCOUNTER — Ambulatory Visit (INDEPENDENT_AMBULATORY_CARE_PROVIDER_SITE_OTHER): Payer: BLUE CROSS/BLUE SHIELD | Admitting: Medical

## 2018-09-19 VITALS — BP 133/80 | HR 86 | Temp 98.0°F | Resp 16 | Ht 65.0 in | Wt 230.2 lb

## 2018-09-19 DIAGNOSIS — L600 Ingrowing nail: Secondary | ICD-10-CM | POA: Diagnosis not present

## 2018-09-19 DIAGNOSIS — L089 Local infection of the skin and subcutaneous tissue, unspecified: Secondary | ICD-10-CM

## 2018-09-19 DIAGNOSIS — L03011 Cellulitis of right finger: Secondary | ICD-10-CM | POA: Diagnosis not present

## 2018-09-19 MED ORDER — DICLOFENAC SODIUM 75 MG PO TBEC: 75.0000 mg | DELAYED_RELEASE_TABLET | Freq: Two times a day (BID) | ORAL | 0 refills | Status: DC

## 2018-09-19 MED ORDER — SULFAMETHOXAZOLE-TRIMETHOPRIM 800-160 MG PO TABS: 1.0000 | ORAL_TABLET | Freq: Two times a day (BID) | ORAL | 0 refills | Status: DC

## 2018-09-19 MED FILL — SULFAMETHOXAZOLE-TMP DS TAB: 800-160 | 10 days supply | Qty: 20 | Fill #0

## 2018-09-19 MED FILL — DICLOFENAC SODIUM 75 MG TAB: 75 | 10 days supply | Qty: 20 | Fill #0

## 2018-09-19 NOTE — Patient Instructions (Signed)
You do appear to have right fifth digit/pinky paronychia(skin infection) with a probable ingrown nail.  I want to start Bactrim DS twice daily today.  I prescribed diclofenac to help with pain and inflammation.  You have tramadol available for more severe pain if needed.  Continue with warm salt water soaks twice daily.  If you have expansion of redness up your arm then recommend ED evaluation this weekend.  Also if you have localized swelling just at the tip indicating abscess that needs to be incised and drained then options could be urgent care versus emergency department.(No indication for I&D today.)  Work note given for today and Monday.  Follow-up on Monday 840 in our office.

## 2018-09-19 NOTE — Progress Notes (Signed)
Subjective:    Patient ID: Jason Harper, male    DOB: May 19, 1971, 48 y.o.   MRN: [id number]  HPI Pt in for rt 5th digit pain for past 6 days. Gradually getting more pain and slight more swelling. No trauma or injury. No splinter type injury reported/described.   Pt showed this area to work nurse and told to soak in warm salt water.  Pt is rt handed.  Pt does not work today or on weekend.    Review of Systems  Constitutional: Negative for chills, fatigue and fever.  Cardiovascular: Negative for chest pain and palpitations.  Gastrointestinal: Negative for abdominal pain.  Musculoskeletal: Negative for back pain.       Left pinkey pain at tip/nail edge.  Skin: Negative for rash.  Neurological: Negative for dizziness, seizures and headaches.  Hematological: Negative for adenopathy. Does not bruise/bleed easily.    Past Medical History:  Diagnosis Date  . Gout 04/29/2017  . Headache 11/12/2016  . Hearing loss in right ear 03/22/2015   Secondary to Scarlet Fever as child  . High cholesterol   . Hypertension   . Obesity 03/22/2015  . Preventative health care 11/12/2016     Social History   Socioeconomic History  . Marital status: Legally Separated    Spouse name: Not on file  . Number of children: Not on file  . Years of education: Not on file  . Highest education level: Not on file  Occupational History  . Not on file  Social Needs  . Financial resource strain: Not on file  . Food insecurity:    Worry: Not on file    Inability: Not on file  . Transportation needs:    Medical: Not on file    Non-medical: Not on file  Tobacco Use  . Smoking status: Never Smoker  . Smokeless tobacco: Never Used  Substance and Sexual Activity  . Alcohol use: Yes    Alcohol/week: 1.0 standard drinks    Types: 1 Cans of beer per week  . Drug use: No  . 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  Physical activity:    Days per week: Not on file    Minutes per session: Not on file  . Stress: Not on file  Relationships  . Social connections:    Talks on phone: Not on file    Gets together: Not on file    Attends religious service: Not on file    Active member of club or organization: Not on file    Attends meetings of clubs or organizations: Not on file    Relationship status: Not on file  . Intimate partner violence:    Fear of current or ex partner: Not on file    Emotionally abused: Not on file    Physically abused: Not on file    Forced sexual activity: Not on file  Other Topics Concern  . Not on file  Social History Narrative  . Not on file    No past surgical history on file.  Family History  Problem Relation Age of Onset  . Hyperlipidemia Mother   . Hypertension Mother   . Arthritis Mother   . Hypertension Father   . Hyperlipidemia Father   . Heart disease Father        MI  . Hypertension Brother   . Heart disease Brother  MI    Allergies  Allergen Reactions  . Penicillins Swelling    Current Outpatient Medications on File Prior to Visit  Medication Sig Dispense Refill  . amLODipine (NORVASC) 10 MG tablet Take 1 tablet (10 mg total) by mouth daily. 30 tablet 5  . aspirin EC 81 MG tablet Take 81 mg by mouth daily.    . diclofenac (VOLTAREN) 75 MG EC tablet Take 1 tablet (75 mg total) by mouth 2 (two) times daily. 60 tablet 1  . EPINEPHrine 0.3 mg/0.3 mL IJ SOAJ injection INJECT CONTENTS OF 1 PEN AS NEEDED FOR ALLERGIC REACTION  1  . escitalopram (LEXAPRO) 10 MG tablet 1/2 tab po daily x 7 days then increase to 1 tab po daily 30 tablet 3  . fenofibrate (TRICOR) 145 MG tablet TAKE ONE TABLET BY MOUTH ONCE DAILY 30 tablet 5  . losartan-hydrochlorothiazide (HYZAAR) 100-25 MG tablet Take 1 tablet by mouth daily. 30 tablet 5  . methylPREDNISolone (MEDROL DOSEPAK) 4 MG TBPK tablet 6 day dose pack - take as directed 21 tablet 0  . nitroGLYCERIN  (NITRODUR - DOSED IN MG/24 HR) 0.2 mg/hr patch Apply 1/4th patch to affected ankle, change daily 30 patch 1  . Omega-3 Fatty Acids (FISH OIL OMEGA-3 PO) Take 1 tablet by mouth daily.    Marland Kitchen omeprazole (PRILOSEC) 40 MG capsule Take 1 capsule (40 mg total) by mouth daily. 30 capsule 5  . phenazopyridine (PYRIDIUM) 200 MG tablet Take 1 tablet (200 mg total) by mouth 3 (three) times daily as needed for pain. 6 tablet 0  . potassium chloride SA (K-DUR,KLOR-CON) 20 MEQ tablet Take 2 tablets daily for 5 days, then take 1 tablet by mouth daily 35 tablet 0  . rosuvastatin (CRESTOR) 20 MG tablet TAKE ONE TABLET BY MOUTH ONCE DAILY 30 tablet 5  . sulfamethoxazole-trimethoprim (BACTRIM DS,SEPTRA DS) 800-160 MG tablet Take 1 tablet by mouth 2 (two) times daily. 14 tablet 0  . tiZANidine (ZANAFLEX) 4 MG tablet Take 1 tablet (4 mg total) by mouth every 6 (six) hours as needed for muscle spasms. 40 tablet 1  . traMADol (ULTRAM) 50 MG tablet Take 1-2 tablets (50-100 mg total) by mouth every 12 (twelve) hours as needed for moderate pain or severe pain. Dz:M54.5 Back Pain 120 tablet 0   No current facility-administered medications on file prior to visit.     BP 133/80   Pulse 86   Temp 98 F (36.7 C) (Oral)   Resp 16   Ht  (1.651 m)   Wt 230 lb 3.2 oz (104.4 kg)   SpO2 98%   BMI 38.31 kg/m       Objective:   Physical Exam  General- No acute distress. Pleasant patient. Neck- Full range of motion, no jvd Lungs- Clear, even and unlabored. Heart- regular rate and rhythm. Neurologic- CNII- XII grossly intact.   Rt hand- 5th digit. Around edge of nail swelling and redness. No dc. Skin feels mild indurated. But no fluctance. No DC. Lateral edge of nail appears swollen and nail may be ingrown.       Assessment & Plan:  You do appear to have right fifth digit/pinky paronychia(skin infection) with a probable ingrown nail.  I want to start Bactrim DS twice daily today.  I prescribed diclofenac to  help with pain and inflammation.  You have tramadol available for more severe pain if needed.  Continue with warm salt water soaks twice daily.  If you have expansion of redness up  your arm then recommend ED evaluation this weekend.  Also if you have localized swelling just at the tip indicating abscess that needs to be incised and drained then options could be urgent care versus emergency department.(No indication for I&D today.)  Work note given for today and Monday.  Esperanza Richters, PA-C

## 2018-09-22 ENCOUNTER — Encounter: Payer: Self-pay | Admitting: Medical

## 2018-09-22 ENCOUNTER — Ambulatory Visit: Payer: BLUE CROSS/BLUE SHIELD | Admitting: Medical

## 2018-09-22 VITALS — BP 120/78 | HR 76 | Temp 98.0°F | Resp 18 | Ht 65.0 in | Wt 233.2 lb

## 2018-09-22 DIAGNOSIS — L600 Ingrowing nail: Secondary | ICD-10-CM

## 2018-09-22 DIAGNOSIS — L089 Local infection of the skin and subcutaneous tissue, unspecified: Secondary | ICD-10-CM | POA: Diagnosis not present

## 2018-09-22 NOTE — Patient Instructions (Signed)
The  finger infection component is resolving but you have ingrown nail lateral aspect. Advise that you continue antibiotic and warm salt water soaks. Give me call and try to my chart me a picture of finger on Wednesday morning. If swelling lateral edge not coming done will need to refer you to hand specialist for excision of ingrown nail portion.   If rapid spreading of redness up hand then UC or ED evlaluation.  3 days since onset of treatment. So hopefully lateral edge of finger will decrease in swelling/size by wed am.  Follow up 2 days or as needed

## 2018-09-22 NOTE — Progress Notes (Signed)
Subjective:    Patient ID: Jason Harper, male    DOB: 05-06-1971, 48 y.o.   MRN: [id number]  HPI Pt in for follow up on ingrown left fifth digit nail ingrown with infection. The area is much less tender. Less red but still swollen lateral nail edge.     Review of Systems  Constitutional: Negative for chills, fatigue and fever.  Respiratory: Negative for cough, chest tightness, shortness of breath and wheezing.   Cardiovascular: Negative for chest pain and palpitations.  Gastrointestinal: Negative for abdominal pain.  Musculoskeletal: Negative for back pain.  Skin:       See physical exam.  Neurological: Negative for dizziness and headaches.  Hematological: Negative for adenopathy. Does not bruise/bleed easily.  Psychiatric/Behavioral: Negative for behavioral problems, confusion and decreased concentration.    Past Medical History:  Diagnosis Date  . Gout 04/29/2017  . Headache 11/12/2016  . Hearing loss in right ear 03/22/2015   Secondary to Scarlet Fever as child  . High cholesterol   . Hypertension   . Obesity 03/22/2015  . Preventative health care 11/12/2016     Social History   Socioeconomic History  . Marital status: Legally Separated    Spouse name: Not on file  . Number of children: Not on file  . Years of education: Not on file  . Highest education level: Not on file  Occupational History  . Not on file  Social Needs  . Financial resource strain: Not on file  . Food insecurity:    Worry: Not on file    Inability: Not on file  . Transportation needs:    Medical: Not on file    Non-medical: Not on file  Tobacco Use  . Smoking status: Never Smoker  . Smokeless tobacco: Never Used  Substance and Sexual Activity  . Alcohol use: Yes    Alcohol/week: 1.0 standard drinks    Types: 1 Cans of beer per week  . Drug use: No  . 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 Physical  activity:    Days per week: Not on file    Minutes per session: Not on file  . Stress: Not on file  Relationships  . Social connections:    Talks on phone: Not on file    Gets together: Not on file    Attends religious service: Not on file    Active member of club or organization: Not on file    Attends meetings of clubs or organizations: Not on file    Relationship status: Not on file  . Intimate partner violence:    Fear of current or ex partner: Not on file    Emotionally abused: Not on file    Physically abused: Not on file    Forced sexual activity: Not on file  Other Topics Concern  . Not on file  Social History Narrative  . Not on file    No past surgical history on file.  Family History  Problem Relation Age of Onset  . Hyperlipidemia Mother   . Hypertension Mother   . Arthritis Mother   . Hypertension Father   . Hyperlipidemia Father   . Heart disease Father        MI  . Hypertension Brother   . Heart disease Brother        MI    Allergies  Allergen Reactions  . Penicillins Swelling  Current Outpatient Medications on File Prior to Visit  Medication Sig Dispense Refill  . amLODipine (NORVASC) 10 MG tablet Take 1 tablet (10 mg total) by mouth daily. 30 tablet 5  . aspirin EC 81 MG tablet Take 81 mg by mouth daily.    . diclofenac (VOLTAREN) 75 MG EC tablet Take 1 tablet (75 mg total) by mouth 2 (two) times daily. 60 tablet 1  . diclofenac (VOLTAREN) 75 MG EC tablet Take 1 tablet (75 mg total) by mouth 2 (two) times daily. 20 tablet 0  . EPINEPHrine 0.3 mg/0.3 mL IJ SOAJ injection INJECT CONTENTS OF 1 PEN AS NEEDED FOR ALLERGIC REACTION  1  . escitalopram (LEXAPRO) 10 MG tablet 1/2 tab po daily x 7 days then increase to 1 tab po daily 30 tablet 3  . fenofibrate (TRICOR) 145 MG tablet TAKE ONE TABLET BY MOUTH ONCE DAILY 30 tablet 5  . losartan-hydrochlorothiazide (HYZAAR) 100-25 MG tablet Take 1 tablet by mouth daily. 30 tablet 5  . methylPREDNISolone  (MEDROL DOSEPAK) 4 MG TBPK tablet 6 day dose pack - take as directed 21 tablet 0  . nitroGLYCERIN (NITRODUR - DOSED IN MG/24 HR) 0.2 mg/hr patch Apply 1/4th patch to affected ankle, change daily 30 patch 1  . Omega-3 Fatty Acids (FISH OIL OMEGA-3 PO) Take 1 tablet by mouth daily.    Marland Kitchen omeprazole (PRILOSEC) 40 MG capsule Take 1 capsule (40 mg total) by mouth daily. 30 capsule 5  . phenazopyridine (PYRIDIUM) 200 MG tablet Take 1 tablet (200 mg total) by mouth 3 (three) times daily as needed for pain. 6 tablet 0  . potassium chloride SA (K-DUR,KLOR-CON) 20 MEQ tablet Take 2 tablets daily for 5 days, then take 1 tablet by mouth daily 35 tablet 0  . rosuvastatin (CRESTOR) 20 MG tablet TAKE ONE TABLET BY MOUTH ONCE DAILY 30 tablet 5  . sulfamethoxazole-trimethoprim (BACTRIM DS,SEPTRA DS) 800-160 MG tablet Take 1 tablet by mouth 2 (two) times daily. 20 tablet 0  . tiZANidine (ZANAFLEX) 4 MG tablet Take 1 tablet (4 mg total) by mouth every 6 (six) hours as needed for muscle spasms. 40 tablet 1  . traMADol (ULTRAM) 50 MG tablet Take 1-2 tablets (50-100 mg total) by mouth every 12 (twelve) hours as needed for moderate pain or severe pain. Dz:M54.5 Back Pain 120 tablet 0   No current facility-administered medications on file prior to visit.     BP 120/78 (BP Location: Left Arm, Patient Position: Sitting, Cuff Size: Large)   Pulse 76   Temp 98 F (36.7 C) (Oral)   Resp 18   Ht 5\' 5"  (1.651 m)   Wt 233 lb 3.2 oz (105.8 kg)   SpO2 96%   BMI 38.81 kg/m       Objective:   Physical Exam  General- No acute distress. Pleasant patient. Neck- Full range of motion, no jvd Lungs- Clear, even and unlabored. Heart- regular rate and rhythm. Neurologic- CNII- XII grossly intact.  Rt fifth digit- lateral edge nail tissue little swollen still with appearance of healing granulation type tissue. The tip is no longer tender to palpation. Pt has good range of motion of finger.       Assessment & Plan:  The   finger infection component is resolving but you have ingrown nail lateral aspect. Advise that you continue antibiotic and warm salt water soaks. Give me call and try to my chart me a picture of finger on Wednesday morning. If swelling lateral edge not coming  done will need to refer you to hand specialist for excision of ingrown nail portion.   If rapid spreading of redness up hand then UC or ED evlaluation.  3 days since onset of treatment. So hopefully lateral edge of finger will decrease in swelling/size by wed am.  Follow up 2 days or as needed  [employer], PA-C

## 2018-09-24 ENCOUNTER — Encounter: Payer: Self-pay | Admitting: Medical

## 2018-09-24 ENCOUNTER — Telehealth: Payer: Self-pay | Admitting: Medical

## 2018-09-24 DIAGNOSIS — L600 Ingrowing nail: Secondary | ICD-10-CM

## 2018-09-24 DIAGNOSIS — L089 Local infection of the skin and subcutaneous tissue, unspecified: Secondary | ICD-10-CM

## 2018-09-24 NOTE — Telephone Encounter (Signed)
Dr. Carmelia Roller says he can evaluate his finger and maybe remove ingrown nail. At 11:15. Will you put that slot on hold. Try to call patient. He may have appointment hand surgeon but that was likley not succesful.  Call pt and see if hand MD office call him.

## 2018-09-24 NOTE — Telephone Encounter (Signed)
Please coordinate with Gwenn and ask her to give me update by early afternoon.

## 2018-09-24 NOTE — Telephone Encounter (Signed)
Spoke to the patient and he stated his finger is feeling better, he is soaking it and taking his antibiotic He cannot take any more time off to come to the doctor as is having to use vacation time. He is scheduled to see Dr. Abner Greenspan on 10/03/18 and will call a few days before that day to schedule appt with Dr. Carmelia Roller (same day as Dr. Abner Greenspan 3/20) as well if finger no better.

## 2018-09-24 NOTE — Telephone Encounter (Signed)
If referral was sent stat Dedra Skeens will see it and do it as an urgent referral.

## 2018-10-03 ENCOUNTER — Telehealth: Payer: Self-pay | Admitting: Family Medicine

## 2018-10-03 ENCOUNTER — Other Ambulatory Visit: Payer: Self-pay

## 2018-10-03 ENCOUNTER — Ambulatory Visit: Payer: BLUE CROSS/BLUE SHIELD | Admitting: Family Medicine

## 2018-10-03 ENCOUNTER — Encounter: Payer: Self-pay | Admitting: Family Medicine

## 2018-10-03 VITALS — BP 122/80 | HR 91 | Temp 98.0°F | Ht 65.0 in | Wt 231.0 lb

## 2018-10-03 DIAGNOSIS — M79672 Pain in left foot: Secondary | ICD-10-CM

## 2018-10-03 MED ORDER — PREDNISONE 20 MG PO TABS: 40.0000 mg | ORAL_TABLET | Freq: Every day | ORAL | 0 refills | Status: AC

## 2018-10-03 MED FILL — predniSONE 20 MG TABS: 20 | 5 days supply | Qty: 10 | Fill #0

## 2018-10-03 NOTE — Progress Notes (Signed)
Musculoskeletal Exam  Patient: Jason Harper DOB: 04/29/1971  DOS: 10/03/2018  SUBJECTIVE:  Chief Complaint:   Chief Complaint  Patient presents with  . Foot Pain    left    Jason Harper is a 48 y.o.  male for evaluation and treatment of L foot pain.   Onset:  2 days ago. No inj or change in activity.  Location: bottom of foot Character:  sharp  Progression of issue:  Got worse over past 2 d Associated symptoms: some swelling Treatment: to date has been rest.   Neurovascular symptoms: no  ROS: Musculoskeletal/Extremities: +L ft pain  Past Medical History:  Diagnosis Date  . Gout 04/29/2017  . Headache 11/12/2016  . Hearing loss in right ear 03/22/2015   Secondary to Scarlet Fever as child  . High cholesterol   . Hypertension   . Obesity 03/22/2015  . Preventative health care 11/12/2016    Objective: VITAL SIGNS: BP 122/80 (BP Location: Left Arm, Patient Position: Sitting, Cuff Size: Normal)   Pulse 91   Temp 98 F (36.7 C) (Oral)   Ht 5\' 5"  (1.651 m)   Wt 231 lb (104.8 kg)   SpO2 96%   BMI 38.44 kg/m  Constitutional: Well formed, well developed. No acute distress. Cardiovascular: Brisk cap refill Thorax & Lungs: No accessory muscle use Musculoskeletal: L foot.   Tenderness to palpation: yes over prox plantar surface of foot medially and laterally, also mild ttp over calcan bursa. Flat feet. Deformity: no Ecchymosis: no Neurologic: Normal sensory function. No focal deficits noted. Psychiatric: Normal mood. Age appropriate judgment and insight. Alert & oriented x 3.    Assessment:  Left foot pain - Plan: predniSONE (DELTASONE) 20 MG tablet  Plan: Orders as above. 5 d pred burst to calm down inflammation. Arch support rec'd. He does have supportive shoes. Activity as tolerated. Stretches/exercises, ice, Tylenol.  F/u prn. The patient voiced understanding and agreement to the plan.   Jilda Roche [employer], DO 10/03/18  10:03 AM

## 2018-10-03 NOTE — Telephone Encounter (Signed)
Copied from CRM [id number]. Topic: Quick Communication - See Telephone Encounter >> Oct 03, 2018  3:09 PM Angela Nevin wrote: CRM for notification. See Telephone encounter for: 10/03/18.  Patient calling to see if Dr. Abner Greenspan would be willing to call in Tamiflu for him as he has been around multiple people who have recently tested positive for flu. Patient was in office today for unrelated visit. Please advise.   Pharmacy: [facility] [address] [place] - [address]

## 2018-10-03 NOTE — Patient Instructions (Addendum)
Ice/cold pack over area for 10-15 min twice daily.  Activity as tolerated.  Wear your arch support/protection every time you wear shoes. Consider PowerStep insoles also.   Foot Stretches/exercises Do exercises exactly as told by your health care provider and adjust them as directed. It is normal to feel mild stretching, pulling, tightness, or discomfort as you do these exercises, but you should stop right away if you feel sudden pain or your pain gets worse.   Stretching and range of motion exercises These exercises warm up your muscles and joints and improve the movement and flexibility of your foot. These exercises also help to relieve pain.  Exercise A: Plantar fascia stretch 1. Sit with your left / right leg crossed over your opposite knee. 2. Hold your heel with one hand with that thumb near your arch. With your other hand, hold your toes and gently pull them back toward the top of your foot. You should feel a stretch on the bottom of your toes or your foot or both. 3. Hold this stretch for 30 seconds. 4. Slowly release your toes and return to the starting position. Repeat 2 times. Complete this exercise 3 times per week.  Exercise B: Gastroc, standing 1. Stand with your hands against a wall. 2. Extend your left / right leg behind you, and bend your front knee slightly. 3. Keeping your heels on the floor and keeping your back knee straight, shift your weight toward the wall without arching your back. You should feel a gentle stretch in your left / right calf. 4. Hold this position for 30 seconds. Repeat 2 times. Complete this exercise 3 times a week. Exercise C: Soleus, standing 1. Stand with your hands against a wall. 2. Extend your left / right leg behind you, and bend your front knee slightly. 3. Keeping your heels on the floor, bend your back knee and slightly shift your weight over the back leg. You should feel a gentle stretch deep in your calf. 4. Hold this position for 30  seconds. Repeat 2 times. Complete this exercise 3 times per week. Exercise D: Gastrocsoleus, standing 1. Stand with the ball of your left / right foot on a step. The ball of your foot is on the walking surface, right under your toes. 2. Keep your other foot firmly on the same step. 3. Hold onto the wall or a railing for balance. 4. Slowly lift your other foot, allowing your body weight to press your heel down over the edge of the step. You should feel a stretch in your left / right calf. 5. Hold this position for 30 seconds. 6. Return both feet to the step. 7. Repeat this exercise with a slight bend in your left / right knee. Repeat 2 times with your left / right knee straight and 2times with your left / right knee bent. Complete this exercise 3 times a week.  Balance exercise This exercise builds your balance and strength control of your arch to help take pressure off your plantar fascia. Exercise E: Single leg stand 1. Without shoes, stand near a railing or in a doorway. You may hold onto the railing or door frame as needed. 2. Stand on your left / right foot. Keep your big toe down on the floor and try to keep your arch lifted. Do not let your foot roll inward. 3. Hold this position for 30 seconds. 4. If this exercise is too easy, you can try it with your eyes closed or while  standing on a pillow. Repeat 2 times. Complete this exercise 3 times per week. This information is not intended to replace advice given to you by your health care provider. Make sure you discuss any questions you have with your health care provider. Document Released: 07/02/2005 Document Revised: 03/06/2016 Document Reviewed: 05/16/2015 Elsevier Interactive Patient Education  2017 [employer].

## 2018-10-03 NOTE — Telephone Encounter (Signed)
Please advise 

## 2018-10-05 ENCOUNTER — Other Ambulatory Visit: Payer: Self-pay | Admitting: Family Medicine

## 2018-10-05 DIAGNOSIS — I10 Essential (primary) hypertension: Secondary | ICD-10-CM

## 2018-10-05 MED ORDER — OSELTAMIVIR PHOSPHATE 75 MG PO CAPS: 75.0000 mg | ORAL_CAPSULE | Freq: Every day | ORAL | 0 refills | Status: DC

## 2018-10-05 NOTE — Telephone Encounter (Signed)
I sent in the Tamiflu. Make sure he is aware and the pharmacy notified him

## 2018-11-10 ENCOUNTER — Ambulatory Visit (INDEPENDENT_AMBULATORY_CARE_PROVIDER_SITE_OTHER): Payer: BLUE CROSS/BLUE SHIELD | Admitting: Podiatry

## 2018-11-10 ENCOUNTER — Ambulatory Visit (INDEPENDENT_AMBULATORY_CARE_PROVIDER_SITE_OTHER): Payer: BLUE CROSS/BLUE SHIELD | Admitting: Family Medicine

## 2018-11-10 ENCOUNTER — Other Ambulatory Visit: Payer: Self-pay

## 2018-11-10 ENCOUNTER — Telehealth: Payer: Self-pay | Admitting: Family Medicine

## 2018-11-10 DIAGNOSIS — M76822 Posterior tibial tendinitis, left leg: Secondary | ICD-10-CM | POA: Diagnosis not present

## 2018-11-10 DIAGNOSIS — S86312D Strain of muscle(s) and tendon(s) of peroneal muscle group at lower leg level, left leg, subsequent encounter: Secondary | ICD-10-CM

## 2018-11-10 DIAGNOSIS — R739 Hyperglycemia, unspecified: Secondary | ICD-10-CM

## 2018-11-10 DIAGNOSIS — M25572 Pain in left ankle and joints of left foot: Secondary | ICD-10-CM

## 2018-11-10 DIAGNOSIS — M545 Low back pain, unspecified: Secondary | ICD-10-CM

## 2018-11-10 DIAGNOSIS — I10 Essential (primary) hypertension: Secondary | ICD-10-CM | POA: Diagnosis not present

## 2018-11-10 DIAGNOSIS — M722 Plantar fascial fibromatosis: Secondary | ICD-10-CM | POA: Diagnosis not present

## 2018-11-10 DIAGNOSIS — Z7189 Other specified counseling: Secondary | ICD-10-CM

## 2018-11-10 DIAGNOSIS — E785 Hyperlipidemia, unspecified: Secondary | ICD-10-CM | POA: Diagnosis not present

## 2018-11-10 MED ORDER — AMLODIPINE BESYLATE 10 MG PO TABS: 10.0000 mg | ORAL_TABLET | Freq: Every day | ORAL | 1 refills | Status: DC

## 2018-11-10 MED ORDER — METHYLPREDNISOLONE 4 MG PO TBPK: ORAL_TABLET | ORAL | 0 refills | Status: DC

## 2018-11-10 MED ORDER — TRAMADOL HCL 50 MG PO TABS: 50.0000 mg | ORAL_TABLET | Freq: Two times a day (BID) | ORAL | 0 refills | Status: DC | PRN

## 2018-11-10 MED ORDER — DICLOFENAC SODIUM 75 MG PO TBEC: 75.0000 mg | DELAYED_RELEASE_TABLET | Freq: Two times a day (BID) | ORAL | 1 refills | Status: DC

## 2018-11-10 NOTE — Assessment & Plan Note (Signed)
Encouraged heart healthy diet, increase exercise, avoid trans fats, consider a krill oil cap daily 

## 2018-11-10 NOTE — Assessment & Plan Note (Signed)
hgba1c acceptable, minimize simple carbs. Increase exercise as tolerated.  

## 2018-11-10 NOTE — Progress Notes (Signed)
   HPI: Patient presents the office today for evaluation of left foot and ankle pain is been going on for greater than 1 year now.  Patient has been seen and treated by multiple physicians including family practice and podiatry without any significant alleviation of symptoms conservatively.  He currently is weightbearing in a immobilization cam boot when he is not working.  Work will not allow him to wear the cam boot, so during work he wears good supportive sneakers with OTC Powerstep insoles and an ankle brace.  He presents for further treatment evaluation  Past Medical History:  Diagnosis Date  . Gout 04/29/2017  . Headache 11/12/2016  . Hearing loss in right ear 03/22/2015   Secondary to Scarlet Fever as child  . High cholesterol   . Hypertension   . Obesity 03/22/2015  . Preventative health care 11/12/2016     Physical Exam: General: The patient is alert and oriented x3 in no acute distress.  Dermatology: Skin is warm, dry and supple bilateral lower extremities. Negative for open lesions or macerations.  Vascular: Palpable pedal pulses bilaterally. No edema or erythema noted. Capillary refill within normal limits.  Neurological: Epicritic and protective threshold grossly intact bilaterally.   Musculoskeletal Exam: Range of motion within normal limits to all pedal and ankle joints bilateral. Muscle strength 5/5 in all groups bilateral.  Several areas of tenderness to palpation specifically the sinus tarsi, peroneal tendons just posterior to the lateral malleolus, plantar fascia at the insertion of the medial calcaneal tubercle, and the posterior tibial tendon distally.  Clinical findings are consistent with MRI impression.  MRI Impression taken 04/14/2018: 1. The primary abnormality is diffuse inflammation throughout the tarsal sinus consistent with sinus tarsi syndrome. There is reactive edema in the adjacent calcaneus and talus. 2. Mild posterior tibial tenosynovitis. Possible mild  peroneus longus tenosynovitis and longitudinal split tearing. 3. No acute osseous or ligamentous findings.   Assessment: 1.  Sinus tarsitis left 2.  Posterior tibial tendinitis left 3.  Peroneal tendinitis with split tear Promius brevis left 4.  Plantar fasciitis left   Plan of Care:  1. Patient evaluated.  MRI reviewed 2.  Injection of 0.5 cc Celestone Soluspan injected into the sinus tarsi left foot 3.  Refill prescription for diclofenac 75 mg twice daily 4.  Continue immobilization cam boot when not working and the ankle brace with OTC insoles during work shifts 5.  Return to clinic in 3-4 weeks.  If there is no improvement we need to consider surgical intervention and discuss our surgical options      Felecia Shelling, DPM [facility]  Dr. Felecia Shelling, DPM    [address] [place], [place] [address]                Office [phone]  Fax [phone]

## 2018-11-10 NOTE — Telephone Encounter (Signed)
Copied from CRM [id number]. Topic: Quick Communication - See Telephone Encounter >> Nov 10, 2018  3:29 PM Terisa Starr wrote: CRM for notification. See Telephone encounter for: 11/10/18.  Patient said he would like his traMADol (ULTRAM) 50 MG tablet sent to the [facility] pharmacy instead of [facility]. Please call patient when this is done. [phone] Thanks

## 2018-11-10 NOTE — Progress Notes (Signed)
Virtual Visit via Video Note  I connected with Lovette Cliche on 11/10/18 at  9:20 AM EDT by a video enabled telemedicine application and verified that I am speaking with the correct person using two identifiers.   I discussed the limitations of evaluation and management by telemedicine and the availability of in person appointments. The patient expressed understanding and agreed to proceed. Crissie Sickles, CMA was able to get patient set up on video platform    Subjective:    Patient ID: Jason Harper, male    DOB: 07/15/1971, 48 y.o.   MRN: [id number]  No chief complaint on file.   HPI Patient is in today for follow up on chronic medical concerns including his hypertension, foot pain, hyperlipidemia and more. He is continuing to work 12 hour shifts on his feet and as a result his left foot pain continues to bother him. Worse after prolonged sitting and just upon standing but present most of the time. Has an appointment with podiatry this afternoon to discuss options. Steroid shots did not help. Oral steroids helped a small amount but then pain worsened again. Gets enough relief from Tramadol to function for a short time. Tried wearing a blow up cast but work requires a closed toe shoe. Denies CP/palp/SOB/HA/congestion/fevers/GI or GU c/o. Taking meds as prescribed  Past Medical History:  Diagnosis Date  . Gout 04/29/2017  . Headache 11/12/2016  . Hearing loss in right ear 03/22/2015   Secondary to Scarlet Fever as child  . High cholesterol   . Hypertension   . Obesity 03/22/2015  . Preventative health care 11/12/2016    No past surgical history on file.  Family History  Problem Relation Age of Onset  . Hyperlipidemia Mother   . Hypertension Mother   . Arthritis Mother   . Hypertension Father   . Hyperlipidemia Father   . Heart disease Father        MI  . Hypertension Brother   . Heart disease Brother        MI    Social History   Socioeconomic History  . Marital status:  Legally Separated    Spouse name: Not on file  . Number of children: Not on file  . Years of education: Not on file  . Highest education level: Not on file  Occupational History  . Not on file  Social Needs  . Financial resource strain: Not on file  . Food insecurity:    Worry: Not on file    Inability: Not on file  . Transportation needs:    Medical: Not on file    Non-medical: Not on file  Tobacco Use  . Smoking status: Never Smoker  . Smokeless tobacco: Never Used  Substance and Sexual Activity  . Alcohol use: Yes    Alcohol/week: 1.0 standard drinks    Types: 1 Cans of beer per week  . Drug use: No  . 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 Physical activity:    Days per week: Not on file    Minutes per session: Not on file  . Stress: Not on file  Relationships  . Social connections:    Talks on phone: Not on file    Gets together: Not on file    Attends religious service: Not on file    Active member of club or organization: Not on file    Attends meetings of clubs or  organizations: Not on file    Relationship status: Not on file  . Intimate partner violence:    Fear of current or ex partner: Not on file    Emotionally abused: Not on file    Physically abused: Not on file    Forced sexual activity: Not on file  Other Topics Concern  . Not on file  Social History Narrative  . Not on file    Outpatient Medications Prior to Visit  Medication Sig Dispense Refill  . aspirin EC 81 MG tablet Take 81 mg by mouth daily.    . diclofenac (VOLTAREN) 75 MG EC tablet Take 1 tablet (75 mg total) by mouth 2 (two) times daily. 60 tablet 1  . diclofenac (VOLTAREN) 75 MG EC tablet Take 1 tablet (75 mg total) by mouth 2 (two) times daily. 20 tablet 0  . EPINEPHrine 0.3 mg/0.3 mL IJ SOAJ injection INJECT CONTENTS OF 1 PEN AS NEEDED FOR ALLERGIC REACTION  1  . escitalopram (LEXAPRO) 10 MG  tablet 1/2 tab po daily x 7 days then increase to 1 tab po daily 30 tablet 3  . fenofibrate (TRICOR) 145 MG tablet TAKE ONE TABLET BY MOUTH ONCE DAILY 30 tablet 5  . losartan-hydrochlorothiazide (HYZAAR) 100-25 MG tablet Take 1 tablet by mouth once daily 90 tablet 0  . nitroGLYCERIN (NITRODUR - DOSED IN MG/24 HR) 0.2 mg/hr patch Apply 1/4th patch to affected ankle, change daily 30 patch 1  . Omega-3 Fatty Acids (FISH OIL OMEGA-3 PO) Take 1 tablet by mouth daily.    Marland Kitchen omeprazole (PRILOSEC) 40 MG capsule Take 1 capsule (40 mg total) by mouth daily. 30 capsule 5  . oseltamivir (TAMIFLU) 75 MG capsule Take 1 capsule (75 mg total) by mouth daily. 10 capsule 0  . phenazopyridine (PYRIDIUM) 200 MG tablet Take 1 tablet (200 mg total) by mouth 3 (three) times daily as needed for pain. 6 tablet 0  . potassium chloride SA (K-DUR,KLOR-CON) 20 MEQ tablet Take 2 tablets daily for 5 days, then take 1 tablet by mouth daily 35 tablet 0  . rosuvastatin (CRESTOR) 20 MG tablet TAKE ONE TABLET BY MOUTH ONCE DAILY 30 tablet 5  . tiZANidine (ZANAFLEX) 4 MG tablet Take 1 tablet (4 mg total) by mouth every 6 (six) hours as needed for muscle spasms. 40 tablet 1  . amLODipine (NORVASC) 10 MG tablet Take 1 tablet (10 mg total) by mouth daily. 30 tablet 5  . methylPREDNISolone (MEDROL DOSEPAK) 4 MG TBPK tablet 6 day dose pack - take as directed 21 tablet 0  . sulfamethoxazole-trimethoprim (BACTRIM DS,SEPTRA DS) 800-160 MG tablet Take 1 tablet by mouth 2 (two) times daily. 20 tablet 0  . traMADol (ULTRAM) 50 MG tablet Take 1-2 tablets (50-100 mg total) by mouth every 12 (twelve) hours as needed for moderate pain or severe pain. Dz:M54.5 Back Pain 120 tablet 0   No facility-administered medications prior to visit.     Allergies  Allergen Reactions  . Penicillins Swelling    Review of Systems  Constitutional: Negative for fever and malaise/fatigue.  HENT: Negative for congestion.   Eyes: Negative for blurred vision.   Respiratory: Negative for shortness of breath.   Cardiovascular: Negative for chest pain, palpitations and leg swelling.  Gastrointestinal: Negative for abdominal pain, blood in stool and nausea.  Genitourinary: Negative for dysuria and frequency.  Musculoskeletal: Positive for joint pain. Negative for falls.  Skin: Negative for rash.  Neurological: Negative for dizziness, loss of consciousness and headaches.  Endo/Heme/Allergies: Negative for environmental allergies.  Psychiatric/Behavioral: Negative for depression. The patient is not nervous/anxious.        Objective:    Physical Exam Constitutional:      Appearance: Normal appearance.  HENT:     Head: Normocephalic and atraumatic.     Nose: Nose normal.  Pulmonary:     Effort: Pulmonary effort is normal.  Neurological:     Mental Status: He is alert and oriented to person, place, and time.  Psychiatric:        Mood and Affect: Mood normal.        Behavior: Behavior normal.     BP 123/90  Wt Readings from Last 3 Encounters:  10/03/18 231 lb (104.8 kg)  09/22/18 233 lb 3.2 oz (105.8 kg)  09/19/18 230 lb 3.2 oz (104.4 kg)    Diabetic Foot Exam - Simple   No data filed     Lab Results  Component Value Date   WBC 11.5 (H) 05/30/2018   HGB 14.6 05/30/2018   HCT 42.7 05/30/2018   PLT 313.0 05/30/2018   GLUCOSE 105 (H) 05/30/2018   CHOL 238 (H) 05/30/2018   TRIG 205.0 (H) 05/30/2018   HDL 27.90 (L) 05/30/2018   LDLDIRECT 176.0 05/30/2018   LDLCALC 145 (H) 02/21/2018   ALT 35 05/30/2018   AST 31 05/30/2018   NA 138 05/30/2018   K 3.6 05/30/2018   CL 100 05/30/2018   CREATININE 0.91 05/30/2018   BUN 9 05/30/2018   CO2 29 05/30/2018   TSH 2.99 05/30/2018   PSA 0.9 07/26/2014   HGBA1C 5.6 05/30/2018    Lab Results  Component Value Date   TSH 2.99 05/30/2018   Lab Results  Component Value Date   WBC 11.5 (H) 05/30/2018   HGB 14.6 05/30/2018   HCT 42.7 05/30/2018   MCV 81.4 05/30/2018   PLT 313.0  05/30/2018   Lab Results  Component Value Date   NA 138 05/30/2018   K 3.6 05/30/2018   CO2 29 05/30/2018   GLUCOSE 105 (H) 05/30/2018   BUN 9 05/30/2018   CREATININE 0.91 05/30/2018   BILITOT 0.8 05/30/2018   ALKPHOS 67 05/30/2018   AST 31 05/30/2018   ALT 35 05/30/2018   PROT 7.4 05/30/2018   ALBUMIN 4.5 05/30/2018   CALCIUM 9.4 05/30/2018   GFR 95.60 05/30/2018   Lab Results  Component Value Date   CHOL 238 (H) 05/30/2018   Lab Results  Component Value Date   HDL 27.90 (L) 05/30/2018   Lab Results  Component Value Date   LDLCALC 145 (H) 02/21/2018   Lab Results  Component Value Date   TRIG 205.0 (H) 05/30/2018   Lab Results  Component Value Date   CHOLHDL 9 05/30/2018   Lab Results  Component Value Date   HGBA1C 5.6 05/30/2018       Assessment & Plan:   Problem List Items Addressed This Visit    Hypertension    Well controlled, no changes to meds. Encouraged heart healthy diet such as the DASH diet and exercise as tolerated.       Relevant Medications   amLODipine (NORVASC) 10 MG tablet   Hyperlipidemia with target LDL less than 100    Encouraged heart healthy diet, increase exercise, avoid trans fats, consider a krill oil cap daily      Relevant Medications   amLODipine (NORVASC) 10 MG tablet   Hyperglycemia    hgba1c acceptable, minimize simple carbs. Increase exercise as tolerated.  Pain in joint, ankle and foot    Left foot pain slightly improved with steroids but now worse again, is seeing podiatry today to discuss options. Continue to ice, stretch, minimize excessive use.       Educated About Covid-19 Virus Infection    Encouraged masking, quarantine, vitamin C, zinc and hand washing.       Other Visit Diagnoses    Low back pain       Relevant Medications   methylPREDNISolone (MEDROL DOSEPAK) 4 MG TBPK tablet   traMADol (ULTRAM) 50 MG tablet      I have discontinued Earl LitesGregory Johannesen's sulfamethoxazole-trimethoprim. I am also  having him maintain his aspirin EC, escitalopram, Omega-3 Fatty Acids (FISH OIL OMEGA-3 PO), nitroGLYCERIN, potassium chloride SA, omeprazole, rosuvastatin, fenofibrate, tiZANidine, EPINEPHrine, diclofenac, phenazopyridine, diclofenac, oseltamivir, losartan-hydrochlorothiazide, methylPREDNISolone, traMADol, and amLODipine.  Meds ordered this encounter  Medications  . methylPREDNISolone (MEDROL DOSEPAK) 4 MG TBPK tablet    Sig: 6 day dose pack - take as directed    Dispense:  21 tablet    Refill:  0  . traMADol (ULTRAM) 50 MG tablet    Sig: Take 1-2 tablets (50-100 mg total) by mouth every 12 (twelve) hours as needed for moderate pain or severe pain. Dz:M54.5 Back Pain    Dispense:  120 tablet    Refill:  0  . amLODipine (NORVASC) 10 MG tablet    Sig: Take 1 tablet (10 mg total) by mouth daily.    Dispense:  90 tablet    Refill:  1     I discussed the assessment and treatment plan with the patient. The patient was provided an opportunity to ask questions and all were answered. The patient agreed with the plan and demonstrated an understanding of the instructions.   The patient was advised to call back or seek an in-person evaluation if the symptoms worsen or if the condition fails to improve as anticipated.  I provided 25 minutes of non-face-to-face time during this encounter.   Danise EdgeStacey Annastacia Duba, MD

## 2018-11-10 NOTE — Assessment & Plan Note (Signed)
Well controlled, no changes to meds. Encouraged heart healthy diet such as the DASH diet and exercise as tolerated.  °

## 2018-11-10 NOTE — Assessment & Plan Note (Signed)
Encouraged masking, quarantine, vitamin C, zinc and hand washing.

## 2018-11-10 NOTE — Telephone Encounter (Signed)
Unable to leave voicemail for patient to schedule a 3 month follow up appointment from 11/10/18 visit.

## 2018-11-10 NOTE — Assessment & Plan Note (Signed)
Left foot pain slightly improved with steroids but now worse again, is seeing podiatry today to discuss options. Continue to ice, stretch, minimize excessive use.

## 2018-11-11 ENCOUNTER — Other Ambulatory Visit: Payer: Self-pay | Admitting: Family Medicine

## 2018-11-11 DIAGNOSIS — M545 Low back pain, unspecified: Secondary | ICD-10-CM

## 2018-11-11 MED ORDER — TRAMADOL HCL 50 MG PO TABS: 50.0000 mg | ORAL_TABLET | Freq: Two times a day (BID) | ORAL | 0 refills | Status: DC | PRN

## 2018-11-11 MED FILL — traMADol HCL 50 MG TABS: 50 | 30 days supply | Qty: 120 | Fill #0

## 2018-11-11 NOTE — Telephone Encounter (Signed)
Please resend medication in to [facility] pharmacy. I spoke with patient before this visit and he did not express any refills when asked.

## 2018-12-01 ENCOUNTER — Other Ambulatory Visit: Payer: Self-pay

## 2018-12-01 ENCOUNTER — Encounter: Payer: Self-pay | Admitting: Podiatry

## 2018-12-01 ENCOUNTER — Telehealth: Payer: Self-pay | Admitting: Podiatry

## 2018-12-01 ENCOUNTER — Ambulatory Visit: Payer: BLUE CROSS/BLUE SHIELD | Admitting: Podiatry

## 2018-12-01 VITALS — Temp 97.7°F

## 2018-12-01 DIAGNOSIS — M79676 Pain in unspecified toe(s): Secondary | ICD-10-CM

## 2018-12-01 DIAGNOSIS — M76822 Posterior tibial tendinitis, left leg: Secondary | ICD-10-CM

## 2018-12-01 DIAGNOSIS — S86312D Strain of muscle(s) and tendon(s) of peroneal muscle group at lower leg level, left leg, subsequent encounter: Secondary | ICD-10-CM

## 2018-12-01 DIAGNOSIS — M722 Plantar fascial fibromatosis: Secondary | ICD-10-CM

## 2018-12-01 DIAGNOSIS — M21862 Other specified acquired deformities of left lower leg: Secondary | ICD-10-CM

## 2018-12-01 DIAGNOSIS — M216X2 Other acquired deformities of left foot: Secondary | ICD-10-CM

## 2018-12-01 DIAGNOSIS — M25572 Pain in left ankle and joints of left foot: Secondary | ICD-10-CM

## 2018-12-01 NOTE — Telephone Encounter (Signed)
Pt just left and didn't write down the date of his surgery. Wanted to confirm it is on Thursday, 02 July.

## 2018-12-01 NOTE — Patient Instructions (Signed)
Pre-Operative Instructions  Congratulations, you have decided to take an important step towards improving your quality of life.  You can be assured that the doctors and staff at [facility] will be with you every step of the way.  Here are some important things you should know:  1. Plan to be at the surgery center/hospital at least 1 (one) hour prior to your scheduled time, unless otherwise directed by the surgical center/hospital staff.  You must have a responsible adult accompany you, remain during the surgery and drive you home.  Make sure you have directions to the surgical center/hospital to ensure you arrive on time. 2. If you are having surgery at [facility] or  hospitals, you will need a copy of your medical history and physical form from your family physician within one month prior to the date of surgery. We will give you a form for your primary physician to complete.  3. We make every effort to accommodate the date you request for surgery.  However, there are times where surgery dates or times have to be moved.  We will contact you as soon as possible if a change in schedule is required.   4. No aspirin/ibuprofen for one week before surgery.  If you are on aspirin, any non-steroidal anti-inflammatory medications (Mobic, Aleve, Ibuprofen) should not be taken seven (7) days prior to your surgery.  You make take Tylenol for pain prior to surgery.  5. Medications - If you are taking daily heart and blood pressure medications, seizure, reflux, allergy, asthma, anxiety, pain or diabetes medications, make sure you notify the surgery center/hospital before the day of surgery so they can tell you which medications you should take or avoid the day of surgery. 6. No food or drink after midnight the night before surgery unless directed otherwise by surgical center/hospital staff. 7. No alcoholic beverages 24-hours prior to surgery.  No smoking 24-hours prior or 24-hours after  surgery. 8. Wear loose pants or shorts. They should be loose enough to fit over bandages, boots, and casts. 9. Don't wear slip-on shoes. Sneakers are preferred. 10. Bring your boot with you to the surgery center/hospital.  Also bring crutches or a walker if your physician has prescribed it for you.  If you do not have this equipment, it will be provided for you after surgery. 11. If you have not been contacted by the surgery center/hospital by the day before your surgery, call to confirm the date and time of your surgery. 12. Leave-time from work may vary depending on the type of surgery you have.  Appropriate arrangements should be made prior to surgery with your employer. 13. Prescriptions will be provided immediately following surgery by your doctor.  Fill these as soon as possible after surgery and take the medication as directed. Pain medications will not be refilled on weekends and must be approved by the doctor. 14. Remove nail polish on the operative foot and avoid getting pedicures prior to surgery. 15. Wash the night before surgery.  The night before surgery wash the foot and leg well with water and the antibacterial soap provided. Be sure to pay special attention to beneath the toenails and in between the toes.  Wash for at least three (3) minutes. Rinse thoroughly with water and dry well with a towel.  Perform this wash unless told not to do so by your physician.  Enclosed: 1 Ice pack (please put in freezer the night before surgery)   1 Hibiclens skin cleaner     Pre-op instructions  If you have any questions regarding the instructions, please do not hesitate to call our office.  [place]: [address], [place], [place] [address] -- [phone]  [place]: [address] [place], [place] [address] -- [phone]  [place]: [address]  [place], [place] [address] -- [phone]  [place]: [address], [address], [place], [place] [address] -- [phone]  Website:  [email or web address] 

## 2018-12-04 ENCOUNTER — Telehealth: Payer: Self-pay | Admitting: *Deleted

## 2018-12-04 NOTE — Telephone Encounter (Signed)
"  I'm just calling to make sure I'm scheduled for surgery on July 2."  Yes, we have you scheduled for surgery on January 15, 2019.  "What time will I need to get there?"  Someone from the surgical center will give you a call a day or two prior to your surgery date and that person will give you your arrival time.

## 2018-12-10 NOTE — Progress Notes (Signed)
    HPI: Patient presents today for follow-up treatment and evaluation regarding left foot pain.  Patient continues to experience pain despite conservative treatments.  He says that he tried to work Wednesday the pain was so bad by the end of the day he could not walk.  The immobilization cam boot helps however he cannot wear at work.  He presents for further treatment and evaluation  Past Medical History:  Diagnosis Date  . Gout 04/29/2017  . Headache 11/12/2016  . Hearing loss in right ear 03/22/2015   Secondary to Scarlet Fever as child  . High cholesterol   . Hypertension   . Obesity 03/22/2015  . Preventative health care 11/12/2016     Physical Exam: General: The patient is alert and oriented x3 in no acute distress.  Dermatology: Skin is warm, dry and supple bilateral lower extremities. Negative for open lesions or macerations.  Vascular: Palpable pedal pulses bilaterally. No edema or erythema noted. Capillary refill within normal limits.  Neurological: Epicritic and protective threshold grossly intact bilaterally.   Musculoskeletal Exam: Range of motion within normal limits to all pedal and ankle joints bilateral. Muscle strength 5/5 in all groups bilateral.  Tenderness to palpation to the plantar fascia left foot.  Increased dorsiflexion of the ankle joint also noted with flexion of the knee consistent with positive Silverskiold test.  Tenderness palpation also noted along the peroneal tendon left.  MRI impression taken 04/14/2018: 1. The primary abnormality is diffuse inflammation throughout the tarsal sinus consistent with sinus tarsi syndrome. There is reactive edema in the adjacent calcaneus and talus. 2. Mild posterior tibial tenosynovitis. Possible mild peroneus longus tenosynovitis and longitudinal split tearing. 3. No acute osseous or ligamentous findings.  Assessment: 1.  Gastroc equinus left lower extremity 2.  Chronic plantar fasciitis left 3.  Peroneal tendinitis  with longitudinal split tearing   Plan of Care:  1. Patient evaluated.  MRI was again reviewed today.  Today the patient only seems to be symptomatic to the peroneal tendons in regards to the findings of the MRI 2.  Today we discussed different treatment modalities including surgical intervention.  Patient would like to proceed with surgical repair and intervention.  All possible complications and details the procedure were explained.  All patient questions answered.  No guarantees were expressed or implied. 3.  Authorization for surgery initiated today.  Surgery will consist of gastroc recession left, EPF left, repair peroneal tendon left. 4.  Return to clinic 1 week postop      Felecia Shelling, DPM [facility]  Dr. Felecia Shelling, DPM    [address] [place], [place] [address]                Office [phone]  Fax [phone]

## 2019-01-12 DIAGNOSIS — Z1159 Encounter for screening for other viral diseases: Secondary | ICD-10-CM | POA: Diagnosis not present

## 2019-01-13 ENCOUNTER — Telehealth: Payer: Self-pay | Admitting: *Deleted

## 2019-01-13 NOTE — Telephone Encounter (Addendum)
DOS 01/15/2019; 29021 - REPAIR PERONEAL TENDON, 11552 - ENDOSCOPIC PLANTAR FASCIOTOMY, 08022 - GASTROCNEMIUS RECESSION LEFT FOOT   BCBS: Effective Date - 01/14/2019  Coverage Status:  Inactive

## 2019-01-14 NOTE — Telephone Encounter (Signed)
"  I am calling regarding Jason Harper.  He confirmed that he was taken out of work as of January 12, 2019.  He said he's going to have surgery on January 15, 2019. We just need to confirm this for his disability claim.  Give me a call back."  I called and informed [name] that he is scheduled to have surgery on tomorrow, 01/15/2019.  She stated that is all she needed to know.

## 2019-01-14 NOTE — Telephone Encounter (Signed)
DOS 01/15/2019; 82505 - REPAIR PERONEAL TENDON, [place], [place] 39767 - ENDOSCOPIC PLANTAR FASCIOTOMY LEFT FOOT  BCBS ANTHEM: Effective Date - 01/14/2019 New ID - [id number]  Deductible - $0 Co-insurance - 100% allowable amount [place] of [place] - $7350.00 individual, $14,700 family  No pre-existing  PRE-CERTIFICATION IS NOT REQUIRED  Call Reference # [id number] --------------------------------------------------------------------------------------------------------------------  I called and informed Caren Griffins about the new insurance number, effective as of today.

## 2019-01-15 ENCOUNTER — Other Ambulatory Visit: Payer: Self-pay | Admitting: Podiatry

## 2019-01-15 DIAGNOSIS — M62462 Contracture of muscle, left lower leg: Secondary | ICD-10-CM | POA: Diagnosis not present

## 2019-01-15 DIAGNOSIS — I10 Essential (primary) hypertension: Secondary | ICD-10-CM | POA: Diagnosis not present

## 2019-01-15 DIAGNOSIS — M722 Plantar fascial fibromatosis: Secondary | ICD-10-CM | POA: Diagnosis not present

## 2019-01-15 DIAGNOSIS — M25572 Pain in left ankle and joints of left foot: Secondary | ICD-10-CM | POA: Diagnosis not present

## 2019-01-15 DIAGNOSIS — S86312D Strain of muscle(s) and tendon(s) of peroneal muscle group at lower leg level, left leg, subsequent encounter: Secondary | ICD-10-CM | POA: Diagnosis not present

## 2019-01-15 DIAGNOSIS — M659 Synovitis and tenosynovitis, unspecified: Secondary | ICD-10-CM | POA: Diagnosis not present

## 2019-01-15 DIAGNOSIS — M7672 Peroneal tendinitis, left leg: Secondary | ICD-10-CM | POA: Diagnosis not present

## 2019-01-15 DIAGNOSIS — M66872 Spontaneous rupture of other tendons, left ankle and foot: Secondary | ICD-10-CM | POA: Diagnosis not present

## 2019-01-15 DIAGNOSIS — M76822 Posterior tibial tendinitis, left leg: Secondary | ICD-10-CM | POA: Diagnosis not present

## 2019-01-15 DIAGNOSIS — M216X2 Other acquired deformities of left foot: Secondary | ICD-10-CM | POA: Diagnosis not present

## 2019-01-15 MED ORDER — DICLOFENAC SODIUM 75 MG PO TBEC: 75.0000 mg | DELAYED_RELEASE_TABLET | Freq: Two times a day (BID) | ORAL | 1 refills | Status: DC

## 2019-01-15 MED ORDER — OXYCODONE-ACETAMINOPHEN 5-325 MG PO TABS: 1.0000 | ORAL_TABLET | Freq: Four times a day (QID) | ORAL | 0 refills | Status: DC | PRN

## 2019-01-15 NOTE — Progress Notes (Signed)
.  postop

## 2019-01-21 ENCOUNTER — Ambulatory Visit (INDEPENDENT_AMBULATORY_CARE_PROVIDER_SITE_OTHER): Payer: BC Managed Care – PPO | Admitting: Podiatry

## 2019-01-21 ENCOUNTER — Other Ambulatory Visit: Payer: Self-pay

## 2019-01-21 ENCOUNTER — Encounter: Payer: Self-pay | Admitting: Podiatry

## 2019-01-21 VITALS — Temp 98.0°F

## 2019-01-21 DIAGNOSIS — Z9889 Other specified postprocedural states: Secondary | ICD-10-CM

## 2019-01-21 DIAGNOSIS — M21862 Other specified acquired deformities of left lower leg: Secondary | ICD-10-CM

## 2019-01-21 DIAGNOSIS — S86312D Strain of muscle(s) and tendon(s) of peroneal muscle group at lower leg level, left leg, subsequent encounter: Secondary | ICD-10-CM

## 2019-01-21 DIAGNOSIS — M216X2 Other acquired deformities of left foot: Secondary | ICD-10-CM

## 2019-01-21 DIAGNOSIS — M722 Plantar fascial fibromatosis: Secondary | ICD-10-CM

## 2019-01-21 DIAGNOSIS — M76822 Posterior tibial tendinitis, left leg: Secondary | ICD-10-CM

## 2019-01-21 MED ORDER — OXYCODONE-ACETAMINOPHEN 5-325 MG PO TABS: 1.0000 | ORAL_TABLET | Freq: Four times a day (QID) | ORAL | 0 refills | Status: DC | PRN

## 2019-01-26 NOTE — Progress Notes (Signed)
   Subjective:  Patient presents today status post gastrocnemius lengthening, EPF and peroneal tendon repair right. DOS: 01/15/2019. He states he is doing well. He denies any significant pain or modifying factors. Patient is here for further evaluation and treatment.     Past Medical History:  Diagnosis Date  . Gout 04/29/2017  . Headache 11/12/2016  . Hearing loss in right ear 03/22/2015   Secondary to Scarlet Fever as child  . High cholesterol   . Hypertension   . Obesity 03/22/2015  . Preventative health care 11/12/2016      Objective/Physical Exam Neurovascular status intact.  Skin incisions appear to be well coapted with sutures and staples intact. No sign of infectious process noted. No dehiscence. No active bleeding noted. Moderate edema noted to the surgical extremity.  Assessment: 1. s/p gastrocnemius lengthening, EPF and peroneal tendon repair right. DOS: 01/15/2019   Plan of Care:  1. Patient was evaluated.  2. Dressing changed. Keep clean, dry and intact for one week.  3. Continue weightbearing in CAM boot.  4. Return to clinic in one week.    Edrick Kins, DPM [facility]  Dr. Edrick Kins, [address]                                        [place], [place] [address]                Office [phone]  Fax [phone]

## 2019-01-28 ENCOUNTER — Other Ambulatory Visit: Payer: Self-pay

## 2019-01-28 ENCOUNTER — Ambulatory Visit (INDEPENDENT_AMBULATORY_CARE_PROVIDER_SITE_OTHER): Payer: Self-pay | Admitting: Podiatry

## 2019-01-28 DIAGNOSIS — Z9889 Other specified postprocedural states: Secondary | ICD-10-CM

## 2019-01-28 DIAGNOSIS — M21862 Other specified acquired deformities of left lower leg: Secondary | ICD-10-CM

## 2019-01-28 DIAGNOSIS — S86312D Strain of muscle(s) and tendon(s) of peroneal muscle group at lower leg level, left leg, subsequent encounter: Secondary | ICD-10-CM

## 2019-01-28 DIAGNOSIS — M76822 Posterior tibial tendinitis, left leg: Secondary | ICD-10-CM

## 2019-01-28 DIAGNOSIS — M722 Plantar fascial fibromatosis: Secondary | ICD-10-CM

## 2019-01-28 DIAGNOSIS — M216X2 Other acquired deformities of left foot: Secondary | ICD-10-CM

## 2019-01-28 MED ORDER — OXYCODONE-ACETAMINOPHEN 5-325 MG PO TABS: 1.0000 | ORAL_TABLET | Freq: Four times a day (QID) | ORAL | 0 refills | Status: DC | PRN

## 2019-01-29 ENCOUNTER — Telehealth: Payer: Self-pay | Admitting: Podiatry

## 2019-01-29 NOTE — Telephone Encounter (Signed)
Pt states he was told he needed to have the doctor call.

## 2019-01-29 NOTE — Telephone Encounter (Addendum)
I called [facility] states pt's insurance is requiring PA for the pain medication, he could self-pay $42.67.

## 2019-01-29 NOTE — Telephone Encounter (Addendum)
I informed pt, his insurance requires a PA or he could self-pay $42.67. pt states he will take his tramadol until pre-certed.

## 2019-01-29 NOTE — Telephone Encounter (Signed)
Pt requested a refill of pain medication and pharmacy states he needs a prior authorization.

## 2019-01-30 ENCOUNTER — Ambulatory Visit (INDEPENDENT_AMBULATORY_CARE_PROVIDER_SITE_OTHER): Payer: BC Managed Care – PPO | Admitting: Family Medicine

## 2019-01-30 ENCOUNTER — Other Ambulatory Visit: Payer: Self-pay

## 2019-01-30 DIAGNOSIS — R739 Hyperglycemia, unspecified: Secondary | ICD-10-CM | POA: Diagnosis not present

## 2019-01-30 DIAGNOSIS — E785 Hyperlipidemia, unspecified: Secondary | ICD-10-CM | POA: Diagnosis not present

## 2019-01-30 DIAGNOSIS — I10 Essential (primary) hypertension: Secondary | ICD-10-CM | POA: Diagnosis not present

## 2019-01-30 DIAGNOSIS — M25572 Pain in left ankle and joints of left foot: Secondary | ICD-10-CM

## 2019-01-30 DIAGNOSIS — K219 Gastro-esophageal reflux disease without esophagitis: Secondary | ICD-10-CM

## 2019-01-30 MED FILL — OXYCODONE-ACETAMINOPHEN 5-3: 5-325 | 7 days supply | Qty: 30 | Fill #0

## 2019-01-31 NOTE — Progress Notes (Signed)
   Subjective:  Patient presents today status post gastrocnemius lengthening, EPF and peroneal tendon repair right. DOS: 01/15/2019. He states he is doing well. He reports some intermittent pain and itching. He has been using the CAM boot and taking Percocet for treatment. There are no worsening factors noted. Patient is here for further evaluation and treatment.    Past Medical History:  Diagnosis Date  . Gout 04/29/2017  . Headache 11/12/2016  . Hearing loss in right ear 03/22/2015   Secondary to Scarlet Fever as child  . High cholesterol   . Hypertension   . Obesity 03/22/2015  . Preventative health care 11/12/2016      Objective/Physical Exam Neurovascular status intact.  Skin incisions appear to be well coapted with sutures and staples intact. No sign of infectious process noted. No dehiscence. No active bleeding noted. Moderate edema noted to the surgical extremity.  Assessment: 1. s/p gastrocnemius lengthening, EPF and peroneal tendon repair right. DOS: 01/15/2019   Plan of Care:  1. Patient was evaluated.  2. Sutures/staples removed.  3. Continue nonweightbearing in CAM boot.  4. Prescription for knee scooter provided to patient.  5. Refill prescription for Percocet 5/325 mg provided to patient.  6. Return to clinic in 4 weeks.    Edrick Kins, DPM [facility]  Dr. Edrick Kins, [place]                                        [place], [place] [address]                Office [phone]  Fax [phone]

## 2019-02-01 NOTE — Assessment & Plan Note (Signed)
Well controlled, no changes to meds. Encouraged heart healthy diet such as the DASH diet and exercise as tolerated.  °

## 2019-02-01 NOTE — Assessment & Plan Note (Signed)
Encouraged heart healthy diet, increase exercise, avoid trans fats, consider a krill oil cap daily 

## 2019-02-01 NOTE — Progress Notes (Signed)
Virtual Visit via Video Note  I connected with Jason Harper on 01/30/19 at 10:00 AM EDT by a video enabled telemedicine application and verified that I am speaking with the correct person using two identifiers.  Location: Patient: home Provider: home   I discussed the limitations of evaluation and management by telemedicine and the availability of in person appointments. The patient expressed understanding and agreed to proceed. [name], CMA was able to get patient set up on a video visit    Subjective:    Patient ID: Jason Harper, male    DOB: [birth year], 48 y.o.   MRN: [id number]  No chief complaint on file.   HPI Patient is in today for follow up on chronic medical concerns including hypertension, hyperlipdiemia, ankle pain and more. He has recently undergone surgery and is recovering. Still notable pain but no fevers on complications. No polyuria or polydipsia. Denies CP/palp/SOB/HA/congestion/fevers/GI or GU c/o. Taking meds as prescribed  Past Medical History:  Diagnosis Date  . Gout 04/29/2017  . Headache 11/12/2016  . Hearing loss in right ear 03/22/2015   Secondary to Scarlet Fever as child  . High cholesterol   . Hypertension   . Obesity 03/22/2015  . Preventative health care 11/12/2016    No past surgical history on file.  Family History  Problem Relation Age of Onset  . Hyperlipidemia Mother   . Hypertension Mother   . Arthritis Mother   . Hypertension Father   . Hyperlipidemia Father   . Heart disease Father        MI  . Hypertension Brother   . Heart disease Brother        MI    Social History   Socioeconomic History  . Marital status: Legally Separated    Spouse name: Not on file  . Number of children: Not on file  . Years of education: Not on file  . Highest education level: Not on file  Occupational History  . Not on file  Social Needs  . Financial resource strain: Not on file  . Food insecurity    Worry: Not on file    Inability: Not  on file  . Transportation needs    Medical: Not on file    Non-medical: Not on file  Tobacco Use  . Smoking status: Never Smoker  . Smokeless tobacco: Never Used  Substance and Sexual Activity  . Alcohol use: Yes    Alcohol/week: 1.0 standard drinks    Types: 1 Cans of beer per week  . Drug use: No  . Sexual activity: Not on file    Comment: lives alon, no dietary restrictions, works Teacher, English as a foreign language job  Lifestyle  . Physical activity    Days per week: Not on file    Minutes per session: Not on file  . Stress: Not on file  Relationships  . Social Herbalist on phone: Not on file    Gets together: Not on file    Attends religious service: Not on file    Active member of club or organization: Not on file    Attends meetings of clubs or organizations: Not on file    Relationship status: Not on file  . Intimate partner violence    Fear of current or ex partner: Not on file    Emotionally abused: Not on file    Physically abused: Not on file    Forced sexual activity: Not on file  Other Topics Concern  . Not on  file  Social History Narrative  . Not on file    Outpatient Medications Prior to Visit  Medication Sig Dispense Refill  . amLODipine (NORVASC) 10 MG tablet Take 1 tablet (10 mg total) by mouth daily. 90 tablet 1  . aspirin EC 81 MG tablet Take 81 mg by mouth daily.    . diclofenac (VOLTAREN) 75 MG EC tablet Take 1 tablet (75 mg total) by mouth 2 (two) times daily. 60 tablet 1  . EPINEPHrine 0.3 mg/0.3 mL IJ SOAJ injection INJECT CONTENTS OF 1 PEN AS NEEDED FOR ALLERGIC REACTION  1  . escitalopram (LEXAPRO) 10 MG tablet 1/2 tab po daily x 7 days then increase to 1 tab po daily 30 tablet 3  . fenofibrate (TRICOR) 145 MG tablet TAKE ONE TABLET BY MOUTH ONCE DAILY 30 tablet 5  . losartan-hydrochlorothiazide (HYZAAR) 100-25 MG tablet Take 1 tablet by mouth once daily 90 tablet 0  . methylPREDNISolone (MEDROL DOSEPAK) 4 MG TBPK tablet 6 day dose pack -  take as directed 21 tablet 0  . nitroGLYCERIN (NITRODUR - DOSED IN MG/24 HR) 0.2 mg/hr patch Apply 1/4th patch to affected ankle, change daily 30 patch 1  . Omega-3 Fatty Acids (FISH OIL OMEGA-3 PO) Take 1 tablet by mouth daily.    Marland Kitchen. omeprazole (PRILOSEC) 40 MG capsule Take 1 capsule (40 mg total) by mouth daily. 30 capsule 5  . oseltamivir (TAMIFLU) 75 MG capsule Take 1 capsule (75 mg total) by mouth daily. 10 capsule 0  . oxyCODONE-acetaminophen (PERCOCET) 5-325 MG tablet Take 1 tablet by mouth every 6 (six) hours as needed for severe pain. 30 tablet 0  . phenazopyridine (PYRIDIUM) 200 MG tablet Take 1 tablet (200 mg total) by mouth 3 (three) times daily as needed for pain. 6 tablet 0  . potassium chloride SA (K-DUR,KLOR-CON) 20 MEQ tablet Take 2 tablets daily for 5 days, then take 1 tablet by mouth daily 35 tablet 0  . rosuvastatin (CRESTOR) 20 MG tablet TAKE ONE TABLET BY MOUTH ONCE DAILY 30 tablet 5  . tiZANidine (ZANAFLEX) 4 MG tablet Take 1 tablet (4 mg total) by mouth every 6 (six) hours as needed for muscle spasms. 40 tablet 1  . traMADol (ULTRAM) 50 MG tablet Take 1-2 tablets (50-100 mg total) by mouth every 12 (twelve) hours as needed for moderate pain or severe pain. Dz:M54.5 Back Pain 120 tablet 0   No facility-administered medications prior to visit.     Allergies  Allergen Reactions  . Penicillins Swelling    Review of Systems  Constitutional: Negative for fever and malaise/fatigue.  HENT: Negative for congestion.   Eyes: Negative for blurred vision.  Respiratory: Negative for shortness of breath.   Cardiovascular: Negative for chest pain, palpitations and leg swelling.  Gastrointestinal: Negative for abdominal pain, blood in stool and nausea.  Genitourinary: Negative for dysuria and frequency.  Musculoskeletal: Positive for joint pain. Negative for falls.  Skin: Negative for rash.  Neurological: Negative for dizziness, loss of consciousness and headaches.   Endo/Heme/Allergies: Negative for environmental allergies.  Psychiatric/Behavioral: Negative for depression. The patient is not nervous/anxious.        Objective:    Physical Exam Constitutional:      Appearance: Normal appearance. He is obese. He is not ill-appearing.  HENT:     Head: Normocephalic and atraumatic.     Nose: Nose normal.  Eyes:     General:        Right eye: No discharge.  Left eye: No discharge.  Pulmonary:     Effort: Pulmonary effort is normal.  Neurological:     Mental Status: He is alert and oriented to person, place, and time.  Psychiatric:        Mood and Affect: Mood normal.        Behavior: Behavior normal.     BP 120/70  Wt Readings from Last 3 Encounters:  10/03/18 231 lb (104.8 kg)  09/22/18 233 lb 3.2 oz (105.8 kg)  09/19/18 230 lb 3.2 oz (104.4 kg)    Diabetic Foot Exam - Simple   No data filed     Lab Results  Component Value Date   WBC 11.5 (H) 05/30/2018   HGB 14.6 05/30/2018   HCT 42.7 05/30/2018   PLT 313.0 05/30/2018   GLUCOSE 105 (H) 05/30/2018   CHOL 238 (H) 05/30/2018   TRIG 205.0 (H) 05/30/2018   HDL 27.90 (L) 05/30/2018   LDLDIRECT 176.0 05/30/2018   LDLCALC 145 (H) 02/21/2018   ALT 35 05/30/2018   AST 31 05/30/2018   NA 138 05/30/2018   K 3.6 05/30/2018   CL 100 05/30/2018   CREATININE 0.91 05/30/2018   BUN 9 05/30/2018   CO2 29 05/30/2018   TSH 2.99 05/30/2018   PSA 0.9 07/26/2014   HGBA1C 5.6 05/30/2018    Lab Results  Component Value Date   TSH 2.99 05/30/2018   Lab Results  Component Value Date   WBC 11.5 (H) 05/30/2018   HGB 14.6 05/30/2018   HCT 42.7 05/30/2018   MCV 81.4 05/30/2018   PLT 313.0 05/30/2018   Lab Results  Component Value Date   NA 138 05/30/2018   K 3.6 05/30/2018   CO2 29 05/30/2018   GLUCOSE 105 (H) 05/30/2018   BUN 9 05/30/2018   CREATININE 0.91 05/30/2018   BILITOT 0.8 05/30/2018   ALKPHOS 67 05/30/2018   AST 31 05/30/2018   ALT 35 05/30/2018   PROT 7.4  05/30/2018   ALBUMIN 4.5 05/30/2018   CALCIUM 9.4 05/30/2018   GFR 95.60 05/30/2018   Lab Results  Component Value Date   CHOL 238 (H) 05/30/2018   Lab Results  Component Value Date   HDL 27.90 (L) 05/30/2018   Lab Results  Component Value Date   LDLCALC 145 (H) 02/21/2018   Lab Results  Component Value Date   TRIG 205.0 (H) 05/30/2018   Lab Results  Component Value Date   CHOLHDL 9 05/30/2018   Lab Results  Component Value Date   HGBA1C 5.6 05/30/2018       Assessment & Plan:   Problem List Items Addressed This Visit    Hypertension    Well controlled, no changes to meds. Encouraged heart healthy diet such as the DASH diet and exercise as tolerated.       Hyperlipidemia with target LDL less than 100    Encouraged heart healthy diet, increase exercise, avoid trans fats, consider a krill oil cap daily      GERD (gastroesophageal reflux disease)    Avoid offending foods, start probiotics. Do not eat large meals in late evening and consider raising head of bed.       Hyperglycemia    hgba1c acceptable, minimize simple carbs. Increase exercise as tolerated.       Pain in joint, ankle and foot    Has recently had surgery and is significant pain and following up with orthopaedics.         I am having Earl LitesGregory  Zeidan maintain his aspirin EC, escitalopram, Omega-3 Fatty Acids (FISH OIL OMEGA-3 PO), nitroGLYCERIN, potassium chloride SA, omeprazole, rosuvastatin, fenofibrate, tiZANidine, EPINEPHrine, phenazopyridine, oseltamivir, losartan-hydrochlorothiazide, methylPREDNISolone, amLODipine, traMADol, diclofenac, and oxyCODONE-acetaminophen.  No orders of the defined types were placed in this encounter.   I discussed the assessment and treatment plan with the patient. The patient was provided an opportunity to ask questions and all were answered. The patient agreed with the plan and demonstrated an understanding of the instructions.   The patient was advised to call  back or seek an in-person evaluation if the symptoms worsen or if the condition fails to improve as anticipated.  I provided 25 minutes of non-face-to-face time during this encounter.   Danise EdgeStacey Kalise Fickett, MD

## 2019-02-01 NOTE — Assessment & Plan Note (Signed)
Has recently had surgery and is significant pain and following up with orthopaedics.

## 2019-02-01 NOTE — Assessment & Plan Note (Signed)
hgba1c acceptable, minimize simple carbs. Increase exercise as tolerated.  

## 2019-02-01 NOTE — Assessment & Plan Note (Signed)
Avoid offending foods, start probiotics. Do not eat large meals in late evening and consider raising head of bed.  

## 2019-02-05 ENCOUNTER — Encounter: Payer: Self-pay | Admitting: Podiatry

## 2019-02-23 ENCOUNTER — Encounter: Payer: Self-pay | Admitting: Podiatry

## 2019-02-23 ENCOUNTER — Other Ambulatory Visit: Payer: Self-pay

## 2019-02-23 ENCOUNTER — Ambulatory Visit (INDEPENDENT_AMBULATORY_CARE_PROVIDER_SITE_OTHER): Payer: Self-pay | Admitting: Podiatry

## 2019-02-23 VITALS — Temp 98.3°F

## 2019-02-23 DIAGNOSIS — M216X2 Other acquired deformities of left foot: Secondary | ICD-10-CM

## 2019-02-23 DIAGNOSIS — S86312D Strain of muscle(s) and tendon(s) of peroneal muscle group at lower leg level, left leg, subsequent encounter: Secondary | ICD-10-CM

## 2019-02-23 DIAGNOSIS — M722 Plantar fascial fibromatosis: Secondary | ICD-10-CM

## 2019-02-23 DIAGNOSIS — M21862 Other specified acquired deformities of left lower leg: Secondary | ICD-10-CM

## 2019-02-23 DIAGNOSIS — M76822 Posterior tibial tendinitis, left leg: Secondary | ICD-10-CM

## 2019-02-25 NOTE — Progress Notes (Signed)
   Subjective:  Patient presents today status post gastrocnemius lengthening, EPF and peroneal tendon repair right. DOS: 01/15/2019. He states he is doing well with only mild pain in the posterior calf. Attempting to apply pressure to the right foot increases the pain. He has been using the knee scooter and taking Percocet for treatment. Patient is here for further evaluation and treatment.    Past Medical History:  Diagnosis Date  . Gout 04/29/2017  . Headache 11/12/2016  . Hearing loss in right ear 03/22/2015   Secondary to Scarlet Fever as child  . High cholesterol   . Hypertension   . Obesity 03/22/2015  . Preventative health care 11/12/2016      Objective/Physical Exam Neurovascular status intact.  Skin incisions appear to be well coapted. No sign of infectious process noted. No dehiscence. No active bleeding noted. Moderate edema noted to the surgical extremity.  Assessment: 1. s/p gastrocnemius lengthening, EPF and peroneal tendon repair right. DOS: 01/15/2019   Plan of Care:  1. Patient was evaluated.  2. Physical therapy ordered three times weekly for four weeks.  3. Patient is set to return to work on 03/23/2019.  4. Discontinue using knee scooter. Weightbearing in CAM boot for three weeks.  5. Return to clinic in 4 weeks.     Edrick Kins, DPM [facility]  Dr. Edrick Kins, [address]                                        [place], [place] [address]                Office [phone]  Fax [phone]

## 2019-03-03 DIAGNOSIS — M25672 Stiffness of left ankle, not elsewhere classified: Secondary | ICD-10-CM | POA: Diagnosis not present

## 2019-03-03 DIAGNOSIS — R269 Unspecified abnormalities of gait and mobility: Secondary | ICD-10-CM | POA: Diagnosis not present

## 2019-03-03 DIAGNOSIS — M25572 Pain in left ankle and joints of left foot: Secondary | ICD-10-CM | POA: Diagnosis not present

## 2019-03-03 DIAGNOSIS — M25472 Effusion, left ankle: Secondary | ICD-10-CM | POA: Diagnosis not present

## 2019-03-09 DIAGNOSIS — M25672 Stiffness of left ankle, not elsewhere classified: Secondary | ICD-10-CM | POA: Diagnosis not present

## 2019-03-09 DIAGNOSIS — R269 Unspecified abnormalities of gait and mobility: Secondary | ICD-10-CM | POA: Diagnosis not present

## 2019-03-09 DIAGNOSIS — M25572 Pain in left ankle and joints of left foot: Secondary | ICD-10-CM | POA: Diagnosis not present

## 2019-03-09 DIAGNOSIS — M25472 Effusion, left ankle: Secondary | ICD-10-CM | POA: Diagnosis not present

## 2019-03-11 DIAGNOSIS — M25672 Stiffness of left ankle, not elsewhere classified: Secondary | ICD-10-CM | POA: Diagnosis not present

## 2019-03-11 DIAGNOSIS — R269 Unspecified abnormalities of gait and mobility: Secondary | ICD-10-CM | POA: Diagnosis not present

## 2019-03-11 DIAGNOSIS — M25572 Pain in left ankle and joints of left foot: Secondary | ICD-10-CM | POA: Diagnosis not present

## 2019-03-11 DIAGNOSIS — M25472 Effusion, left ankle: Secondary | ICD-10-CM | POA: Diagnosis not present

## 2019-03-13 ENCOUNTER — Ambulatory Visit (INDEPENDENT_AMBULATORY_CARE_PROVIDER_SITE_OTHER): Payer: BC Managed Care – PPO | Admitting: *Deleted

## 2019-03-13 ENCOUNTER — Other Ambulatory Visit (INDEPENDENT_AMBULATORY_CARE_PROVIDER_SITE_OTHER): Payer: BC Managed Care – PPO

## 2019-03-13 ENCOUNTER — Other Ambulatory Visit: Payer: Self-pay

## 2019-03-13 DIAGNOSIS — R739 Hyperglycemia, unspecified: Secondary | ICD-10-CM

## 2019-03-13 DIAGNOSIS — I10 Essential (primary) hypertension: Secondary | ICD-10-CM | POA: Diagnosis not present

## 2019-03-13 DIAGNOSIS — E785 Hyperlipidemia, unspecified: Secondary | ICD-10-CM | POA: Diagnosis not present

## 2019-03-13 DIAGNOSIS — Z23 Encounter for immunization: Secondary | ICD-10-CM

## 2019-03-13 LAB — COMPREHENSIVE METABOLIC PANEL
ALT: 34 U/L (ref 0–53)
AST: 27 U/L (ref 0–37)
Albumin: 4.2 g/dL (ref 3.5–5.2)
Alkaline Phosphatase: 60 U/L (ref 39–117)
BUN: 9 mg/dL (ref 6–23)
CO2: 28 mEq/L (ref 19–32)
Calcium: 9.1 mg/dL (ref 8.4–10.5)
Chloride: 103 mEq/L (ref 96–112)
Creatinine, Ser: 0.84 mg/dL (ref 0.40–1.50)
GFR: 98.31 mL/min (ref >60.00)
Glucose, Bld: 117 mg/dL — ABNORMAL HIGH (ref 70–99)
Potassium: 4.2 mEq/L (ref 3.5–5.1)
Sodium: 139 mEq/L (ref 135–145)
Total Bilirubin: 0.4 mg/dL (ref 0.2–1.2)
Total Protein: 6.8 g/dL (ref 6.0–8.3)

## 2019-03-13 LAB — LIPID PANEL
Cholesterol: 185 mg/dL (ref 0–200)
HDL: 30.3 mg/dL — ABNORMAL LOW (ref >39.00)
NonHDL: 154.8
Total CHOL/HDL Ratio: 6
Triglycerides: 214 mg/dL — ABNORMAL HIGH (ref 0.0–149.0)
VLDL: 42.8 mg/dL — ABNORMAL HIGH (ref 0.0–40.0)

## 2019-03-13 LAB — CBC
HCT: 39.2 % (ref 39.0–52.0)
Hemoglobin: 13.2 g/dL (ref 13.0–17.0)
MCHC: 33.7 g/dL (ref 30.0–36.0)
MCV: 84.1 fl (ref 78.0–100.0)
Platelets: 309 10*3/uL (ref 150.0–400.0)
RBC: 4.66 Mil/uL (ref 4.22–5.81)
RDW: 13.4 % (ref 11.5–15.5)
WBC: 8.7 10*3/uL (ref 4.0–10.5)

## 2019-03-13 LAB — HEMOGLOBIN A1C: Hgb A1c MFr Bld: 5.6 % (ref 4.6–6.5)

## 2019-03-13 LAB — LDL CHOLESTEROL, DIRECT: Direct LDL: 124 mg/dL

## 2019-03-13 NOTE — Progress Notes (Signed)
Patient her for flu shot.    Vaccine given and patient tolerated well. 

## 2019-03-16 DIAGNOSIS — R269 Unspecified abnormalities of gait and mobility: Secondary | ICD-10-CM | POA: Diagnosis not present

## 2019-03-16 DIAGNOSIS — M25472 Effusion, left ankle: Secondary | ICD-10-CM | POA: Diagnosis not present

## 2019-03-16 DIAGNOSIS — M25572 Pain in left ankle and joints of left foot: Secondary | ICD-10-CM | POA: Diagnosis not present

## 2019-03-16 DIAGNOSIS — M25672 Stiffness of left ankle, not elsewhere classified: Secondary | ICD-10-CM | POA: Diagnosis not present

## 2019-03-18 ENCOUNTER — Other Ambulatory Visit: Payer: Self-pay

## 2019-03-18 ENCOUNTER — Ambulatory Visit (INDEPENDENT_AMBULATORY_CARE_PROVIDER_SITE_OTHER): Payer: Self-pay | Admitting: Podiatry

## 2019-03-18 DIAGNOSIS — M25672 Stiffness of left ankle, not elsewhere classified: Secondary | ICD-10-CM | POA: Diagnosis not present

## 2019-03-18 DIAGNOSIS — M25472 Effusion, left ankle: Secondary | ICD-10-CM | POA: Diagnosis not present

## 2019-03-18 DIAGNOSIS — M21862 Other specified acquired deformities of left lower leg: Secondary | ICD-10-CM

## 2019-03-18 DIAGNOSIS — M25572 Pain in left ankle and joints of left foot: Secondary | ICD-10-CM | POA: Diagnosis not present

## 2019-03-18 DIAGNOSIS — M216X2 Other acquired deformities of left foot: Secondary | ICD-10-CM

## 2019-03-18 DIAGNOSIS — Z9889 Other specified postprocedural states: Secondary | ICD-10-CM

## 2019-03-18 DIAGNOSIS — M76822 Posterior tibial tendinitis, left leg: Secondary | ICD-10-CM

## 2019-03-18 DIAGNOSIS — R269 Unspecified abnormalities of gait and mobility: Secondary | ICD-10-CM | POA: Diagnosis not present

## 2019-03-18 NOTE — Progress Notes (Signed)
   Subjective:  Patient presents today status post gastrocnemius lengthening, EPF and peroneal tendon repair right. DOS: 01/15/2019.  Patient states that he is going to physical therapy and there is some improvement.  He recently discontinued wearing his cam boot.  He continues to have some pain and tenderness throughout the day with ambulation.    Past Medical History:  Diagnosis Date  . Gout 04/29/2017  . Headache 11/12/2016  . Hearing loss in right ear 03/22/2015   Secondary to Scarlet Fever as child  . High cholesterol   . Hypertension   . Obesity 03/22/2015  . Preventative health care 11/12/2016      Objective/Physical Exam Neurovascular status intact.  Skin incisions appear to be well coapted. No sign of infectious process noted. No dehiscence. No active bleeding noted. Moderate edema noted to the surgical extremity.  Assessment: 1. s/p gastrocnemius lengthening, EPF and peroneal tendon repair right. DOS: 01/15/2019   Plan of Care:  1. Patient was evaluated.  2.  Continue physical therapy as prescribed 3.  Patient may discontinue the cam boot.  Recommend good supportive shoes with ankle brace 4.  Ankle brace dispensed today 5.  We are going to extend the patient's return to work date to return to 04/20/2019.  Patient is currently still recovering from postoperative pain and tenderness and will need an additional month to continue physical therapy and transition into good supportive tennis shoes 6.  Clinic in 4 weeks    Edrick Kins, DPM [facility]  Dr. Edrick Kins, [address]                                        [place], [place] [address]                Office [phone]  Fax [phone]

## 2019-03-19 MED ORDER — ATORVASTATIN CALCIUM 40 MG PO TABS: 40.0000 mg | ORAL_TABLET | Freq: Every day | ORAL | 3 refills | Status: DC

## 2019-03-19 NOTE — Addendum Note (Signed)
Addended by: [name] A on: 03/19/2019 02:42 PM   Modules accepted: Orders

## 2019-03-24 DIAGNOSIS — M25572 Pain in left ankle and joints of left foot: Secondary | ICD-10-CM | POA: Diagnosis not present

## 2019-03-24 DIAGNOSIS — R269 Unspecified abnormalities of gait and mobility: Secondary | ICD-10-CM | POA: Diagnosis not present

## 2019-03-24 DIAGNOSIS — M25672 Stiffness of left ankle, not elsewhere classified: Secondary | ICD-10-CM | POA: Diagnosis not present

## 2019-03-24 DIAGNOSIS — M25472 Effusion, left ankle: Secondary | ICD-10-CM | POA: Diagnosis not present

## 2019-03-26 DIAGNOSIS — M25572 Pain in left ankle and joints of left foot: Secondary | ICD-10-CM | POA: Diagnosis not present

## 2019-03-26 DIAGNOSIS — R269 Unspecified abnormalities of gait and mobility: Secondary | ICD-10-CM | POA: Diagnosis not present

## 2019-03-26 DIAGNOSIS — M25472 Effusion, left ankle: Secondary | ICD-10-CM | POA: Diagnosis not present

## 2019-03-26 DIAGNOSIS — M25672 Stiffness of left ankle, not elsewhere classified: Secondary | ICD-10-CM | POA: Diagnosis not present

## 2019-03-30 DIAGNOSIS — M25672 Stiffness of left ankle, not elsewhere classified: Secondary | ICD-10-CM | POA: Diagnosis not present

## 2019-03-30 DIAGNOSIS — R269 Unspecified abnormalities of gait and mobility: Secondary | ICD-10-CM | POA: Diagnosis not present

## 2019-03-30 DIAGNOSIS — M25572 Pain in left ankle and joints of left foot: Secondary | ICD-10-CM | POA: Diagnosis not present

## 2019-03-30 DIAGNOSIS — M25472 Effusion, left ankle: Secondary | ICD-10-CM | POA: Diagnosis not present

## 2019-04-01 DIAGNOSIS — M25672 Stiffness of left ankle, not elsewhere classified: Secondary | ICD-10-CM | POA: Diagnosis not present

## 2019-04-01 DIAGNOSIS — M25472 Effusion, left ankle: Secondary | ICD-10-CM | POA: Diagnosis not present

## 2019-04-01 DIAGNOSIS — R269 Unspecified abnormalities of gait and mobility: Secondary | ICD-10-CM | POA: Diagnosis not present

## 2019-04-01 DIAGNOSIS — M25572 Pain in left ankle and joints of left foot: Secondary | ICD-10-CM | POA: Diagnosis not present

## 2019-04-06 DIAGNOSIS — M25672 Stiffness of left ankle, not elsewhere classified: Secondary | ICD-10-CM | POA: Diagnosis not present

## 2019-04-06 DIAGNOSIS — M25472 Effusion, left ankle: Secondary | ICD-10-CM | POA: Diagnosis not present

## 2019-04-06 DIAGNOSIS — M25572 Pain in left ankle and joints of left foot: Secondary | ICD-10-CM | POA: Diagnosis not present

## 2019-04-06 DIAGNOSIS — R269 Unspecified abnormalities of gait and mobility: Secondary | ICD-10-CM | POA: Diagnosis not present

## 2019-04-10 ENCOUNTER — Other Ambulatory Visit: Payer: Self-pay | Admitting: Family Medicine

## 2019-04-10 DIAGNOSIS — M545 Low back pain, unspecified: Secondary | ICD-10-CM

## 2019-04-13 ENCOUNTER — Other Ambulatory Visit: Payer: Self-pay

## 2019-04-13 ENCOUNTER — Ambulatory Visit (INDEPENDENT_AMBULATORY_CARE_PROVIDER_SITE_OTHER): Payer: BC Managed Care – PPO | Admitting: Podiatry

## 2019-04-13 ENCOUNTER — Encounter: Payer: Self-pay | Admitting: Podiatry

## 2019-04-13 DIAGNOSIS — M25572 Pain in left ankle and joints of left foot: Secondary | ICD-10-CM | POA: Diagnosis not present

## 2019-04-13 DIAGNOSIS — R269 Unspecified abnormalities of gait and mobility: Secondary | ICD-10-CM | POA: Diagnosis not present

## 2019-04-13 DIAGNOSIS — M25672 Stiffness of left ankle, not elsewhere classified: Secondary | ICD-10-CM | POA: Diagnosis not present

## 2019-04-13 DIAGNOSIS — Z9889 Other specified postprocedural states: Secondary | ICD-10-CM

## 2019-04-13 DIAGNOSIS — M216X2 Other acquired deformities of left foot: Secondary | ICD-10-CM | POA: Diagnosis not present

## 2019-04-13 DIAGNOSIS — S86312D Strain of muscle(s) and tendon(s) of peroneal muscle group at lower leg level, left leg, subsequent encounter: Secondary | ICD-10-CM

## 2019-04-13 DIAGNOSIS — G5791 Unspecified mononeuropathy of right lower limb: Secondary | ICD-10-CM | POA: Diagnosis not present

## 2019-04-13 DIAGNOSIS — M21862 Other specified acquired deformities of left lower leg: Secondary | ICD-10-CM

## 2019-04-13 DIAGNOSIS — M25472 Effusion, left ankle: Secondary | ICD-10-CM | POA: Diagnosis not present

## 2019-04-14 ENCOUNTER — Telehealth: Payer: Self-pay

## 2019-04-14 MED ORDER — TRAMADOL HCL 50 MG PO TABS: 50.0000 mg | ORAL_TABLET | Freq: Two times a day (BID) | ORAL | 0 refills | Status: DC | PRN

## 2019-04-14 MED FILL — traMADol HCL 50 MG TABS: 50 | 30 days supply | Qty: 120 | Fill #0

## 2019-04-14 NOTE — Telephone Encounter (Signed)
Requesting:tramadol Contract:yes UDS:no Last OV: 01/30/19 Next OV:n/a Last Refill:11/11/18  #[id number] Database:   Please advise

## 2019-04-14 NOTE — Telephone Encounter (Signed)
PA approved. Effective 04/14/2019 to 10/11/2019.

## 2019-04-14 NOTE — Progress Notes (Signed)
   Subjective:  Patient presents today status post gastrocnemius lengthening, EPF and peroneal tendon repair right. DOS: 01/15/2019. He states he is doing well and has improved significantly since his last visit. He states applying pressure is still difficult at this time. He has been using the CAM boot and ankle brace and has been in physical therapy which he states all have been helping alleviate his symptoms. Patient is here for further evaluation and treatment.   Past Medical History:  Diagnosis Date  . Gout 04/29/2017  . Headache 11/12/2016  . Hearing loss in right ear 03/22/2015   Secondary to Scarlet Fever as child  . High cholesterol   . Hypertension   . Obesity 03/22/2015  . Preventative health care 11/12/2016      Objective/Physical Exam Neurovascular status intact.  Skin incisions appear to be well coapted. No sign of infectious process noted. No dehiscence. No active bleeding noted. Moderate edema noted to the surgical extremity. Paresthesia with burning, shooting pain noted to the right lateral foot.    Assessment: 1. s/p gastrocnemius lengthening, EPF and peroneal tendon repair right. DOS: 01/15/2019 2. Neuritis right lateral foot   Plan of Care:  1. Patient was evaluated.  2. Injection of 0.5 mLs Celestone Soluspan injected along the sural nerve of the right lateral foot.  3. Recommended good shoe gear.  4. Return to work 04/27/2019.  5. Continue using ankle brace.  6. Return to clinic in 6 weeks.     Edrick Kins, DPM [facility]  Dr. Edrick Kins, [address]                                        [place], [place] [address]                Office [phone]  Fax [phone]

## 2019-04-14 NOTE — Telephone Encounter (Signed)
PA initiated via Covermymeds; KEY: [id number]. Awaiting determination.

## 2019-04-15 DIAGNOSIS — M25572 Pain in left ankle and joints of left foot: Secondary | ICD-10-CM | POA: Diagnosis not present

## 2019-04-15 DIAGNOSIS — M25472 Effusion, left ankle: Secondary | ICD-10-CM | POA: Diagnosis not present

## 2019-04-15 DIAGNOSIS — R269 Unspecified abnormalities of gait and mobility: Secondary | ICD-10-CM | POA: Diagnosis not present

## 2019-04-15 DIAGNOSIS — M25672 Stiffness of left ankle, not elsewhere classified: Secondary | ICD-10-CM | POA: Diagnosis not present

## 2019-04-22 DIAGNOSIS — M25472 Effusion, left ankle: Secondary | ICD-10-CM | POA: Diagnosis not present

## 2019-04-22 DIAGNOSIS — M25572 Pain in left ankle and joints of left foot: Secondary | ICD-10-CM | POA: Diagnosis not present

## 2019-04-22 DIAGNOSIS — R269 Unspecified abnormalities of gait and mobility: Secondary | ICD-10-CM | POA: Diagnosis not present

## 2019-04-22 DIAGNOSIS — M25672 Stiffness of left ankle, not elsewhere classified: Secondary | ICD-10-CM | POA: Diagnosis not present

## 2019-05-25 ENCOUNTER — Other Ambulatory Visit: Payer: Self-pay | Admitting: Family Medicine

## 2019-05-25 ENCOUNTER — Telehealth: Payer: Self-pay | Admitting: Family Medicine

## 2019-05-25 ENCOUNTER — Ambulatory Visit (INDEPENDENT_AMBULATORY_CARE_PROVIDER_SITE_OTHER): Payer: BC Managed Care – PPO | Admitting: Podiatry

## 2019-05-25 ENCOUNTER — Encounter: Payer: Self-pay | Admitting: *Deleted

## 2019-05-25 ENCOUNTER — Other Ambulatory Visit: Payer: Self-pay

## 2019-05-25 DIAGNOSIS — M21862 Other specified acquired deformities of left lower leg: Secondary | ICD-10-CM

## 2019-05-25 DIAGNOSIS — M216X2 Other acquired deformities of left foot: Secondary | ICD-10-CM

## 2019-05-25 DIAGNOSIS — G5791 Unspecified mononeuropathy of right lower limb: Secondary | ICD-10-CM

## 2019-05-25 DIAGNOSIS — S86312D Strain of muscle(s) and tendon(s) of peroneal muscle group at lower leg level, left leg, subsequent encounter: Secondary | ICD-10-CM | POA: Diagnosis not present

## 2019-05-25 DIAGNOSIS — Z9889 Other specified postprocedural states: Secondary | ICD-10-CM | POA: Diagnosis not present

## 2019-05-25 MED ORDER — ROSUVASTATIN CALCIUM 20 MG PO TABS: 20.0000 mg | ORAL_TABLET | Freq: Every day | ORAL | 5 refills | Status: DC

## 2019-05-25 NOTE — Telephone Encounter (Signed)
Note faxed over and confirmation was received.  Tried calling patient no answer/ vm full

## 2019-05-25 NOTE — Telephone Encounter (Signed)
Copied from [place] [id number]. Topic: General - Other >> May 25, 2019 11:52 AM Burchel, Abbi R wrote: Reason for CRM: Please fax documentation of pt's flu shot to his employer:  [employer] Fax: [phone]

## 2019-05-25 NOTE — Telephone Encounter (Signed)
Medication Refill - Medication: rosuvastatin (CRESTOR) 20 MG tablet [[id number]   Has the patient contacted their pharmacy? Yes.   (Agent: If no, request that the patient contact the pharmacy for the refill.) (Agent: If yes, when and what did the pharmacy advise?)  Preferred Pharmacy (with phone number or street name):  [facility] [address] [place] [phone] (Phone)     Agent: Please be advised that RX refills may take up to 3 business days. We ask that you follow-up with your pharmacy.

## 2019-05-25 NOTE — Telephone Encounter (Signed)
Please re send rosuvastatin (CRESTOR) 20 MG tablet to:  [facility] [address], [place]  [place] [address]  Phone: [phone] Fax: [phone]

## 2019-05-28 NOTE — Progress Notes (Signed)
   Subjective:  Patient presents today status post gastrocnemius lengthening, EPF and peroneal tendon repair right. DOS: 01/15/2019. He states he is doing well and improving. He reports some lateral ankle pain and numbness when standing for long periods of time. He has been using the compression anklet and taking Diclofenac as directed. Patient is here for further evaluation and treatment.   Past Medical History:  Diagnosis Date  . Gout 04/29/2017  . Headache 11/12/2016  . Hearing loss in right ear 03/22/2015   Secondary to Scarlet Fever as child  . High cholesterol   . Hypertension   . Obesity 03/22/2015  . Preventative health care 11/12/2016      Objective/Physical Exam Neurovascular status intact.  Skin incisions appear to be well coapted. No sign of infectious process noted. No dehiscence. No active bleeding noted. Moderate edema noted to the surgical extremity. Paresthesia with burning, shooting pain noted to the right lateral foot.    Assessment: 1. s/p gastrocnemius lengthening, EPF and peroneal tendon repair right. DOS: 01/15/2019 2. Neuritis right lateral foot   Plan of Care:  1. Patient was evaluated.  2. May resume full activity with no restrictions.  3. Continue using compression anklet.  4. Recommended good shoe gear.  5. Continue taking Diclofenac as needed.  6. Return to clinic as needed.      Edrick Kins, DPM [facility]  Dr. Edrick Kins, [address]                                        [place], [place] [address]                Office [phone]  Fax [phone]

## 2019-06-04 IMAGING — MR MR ANKLE*L* W/O CM
4 of 5 series · 31 of 40 positions shown · non-contrast
Comparison: Foot radiographs 03/01/2018.

CLINICAL DATA: Persistent medial ankle pain extending into the heel
with intermittent swelling since falling on ice 4 years ago. No
previous relevant surgery.

EXAM:
MRI OF THE LEFT ANKLE WITHOUT CONTRAST
TECHNIQUE: Multiplanar, multisequence MR imaging of the ankle was performed. No
intravenous contrast was administered.

[Series 5: T1 · sagittal · 3.0mm · 0.28mm/px · 5 of 24 slices shown]
[im 1/24]
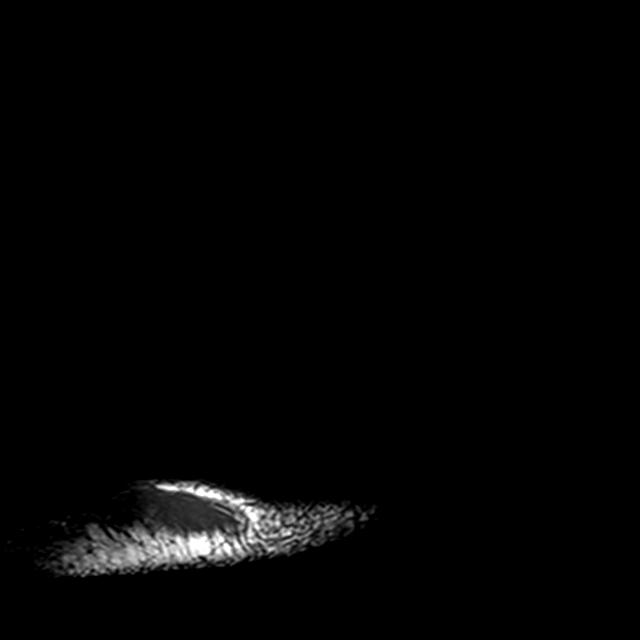
[im 5/24]
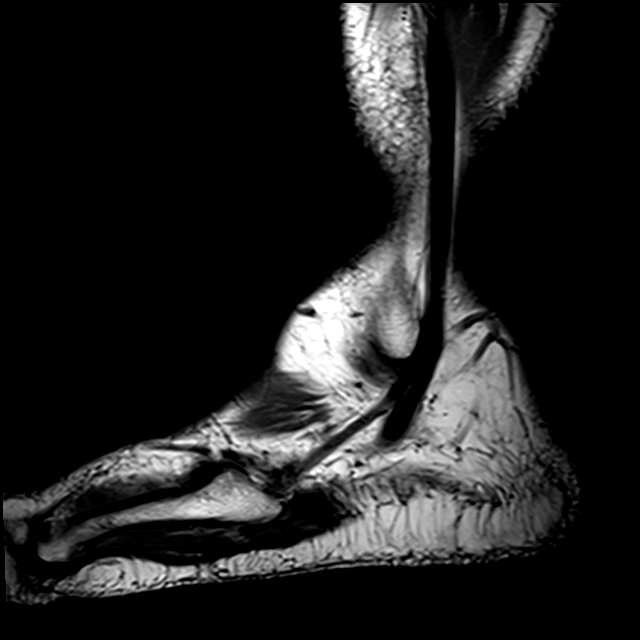
[im 10/24]
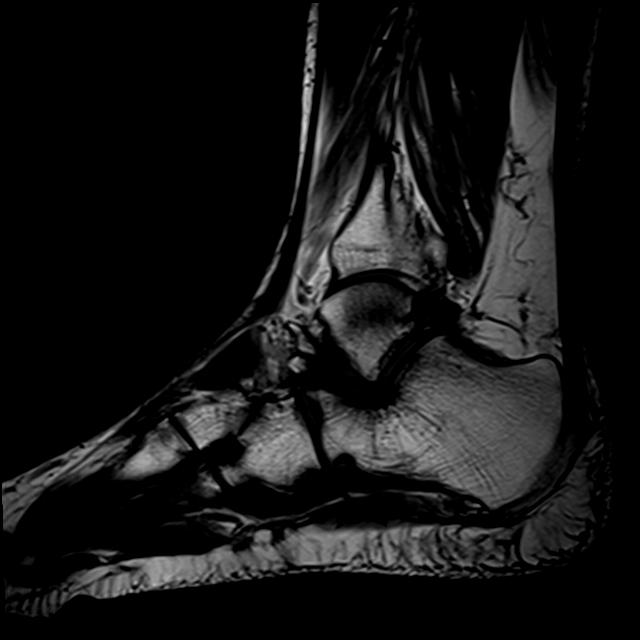
[im 14/24]
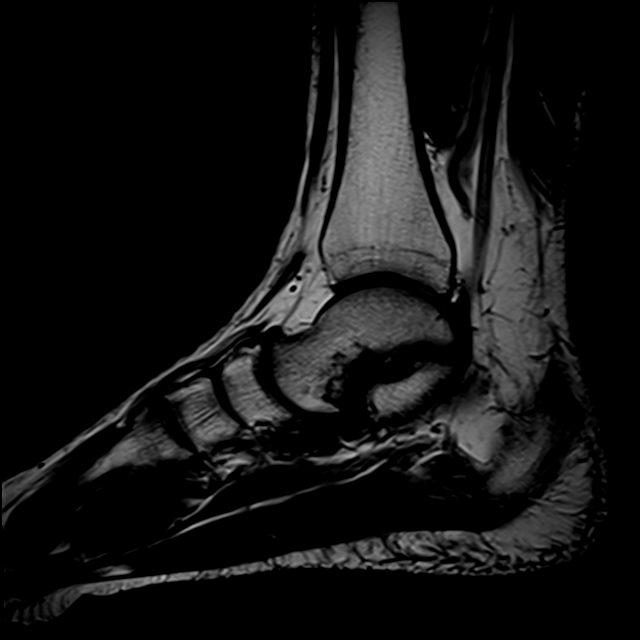
[im 24/24]
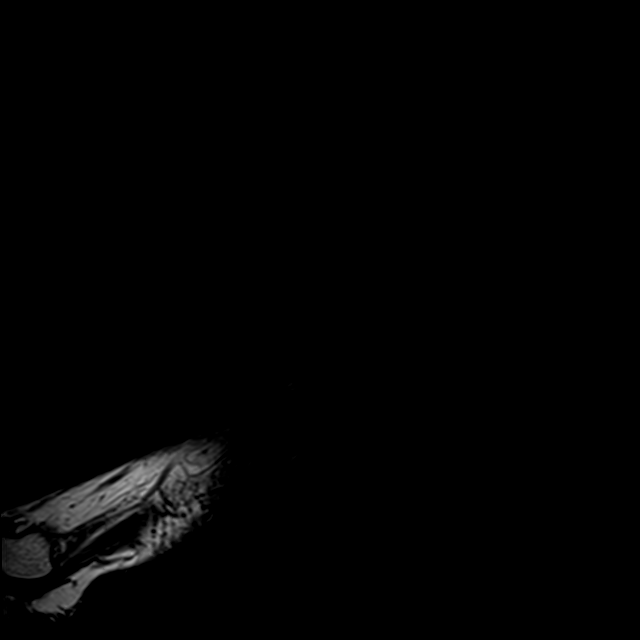

[Series 7: T2 fat-sat · axial · 3.0mm · 0.70mm/px · z∈[-98,+27]mm · 9 of 36 slices shown (1 of 2)]
[im 1/36]
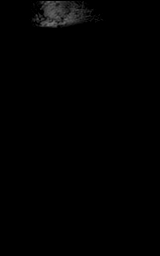
[im 5/36]
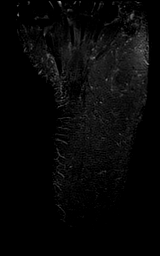
[im 9/36]
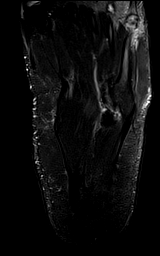
[im 14/36]
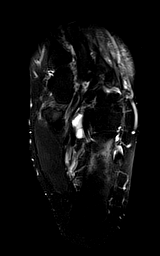
[im 18/36]
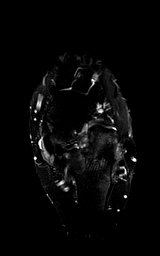
[im 22/36]
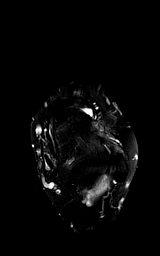
[im 27/36]
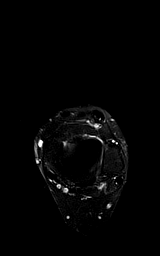
[im 31/36]
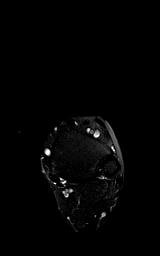
[im 36/36]
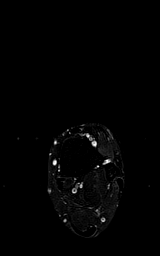

[Series 8: PD fat-sat · axial · 3.0mm · 0.47mm/px · z∈[-98,+27]mm · 9 of 36 slices shown]
[im 1/36]
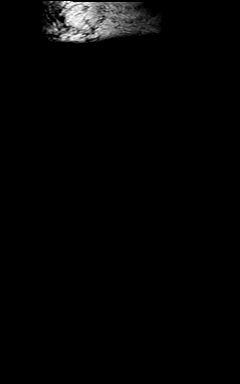
[im 5/36]
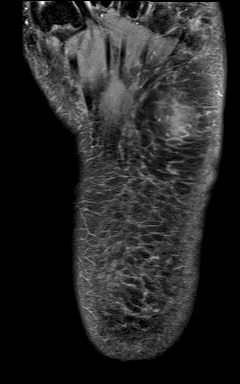
[im 9/36]
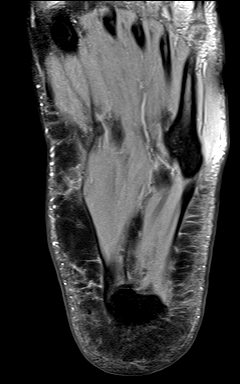
[im 14/36]
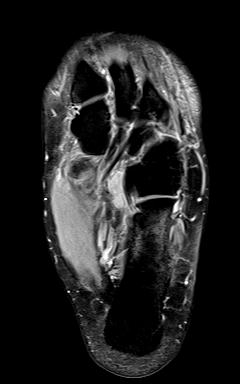
[im 18/36]
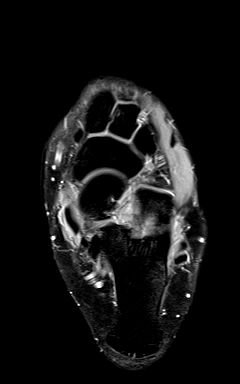
[im 22/36]
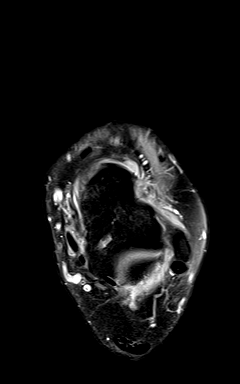
[im 27/36]
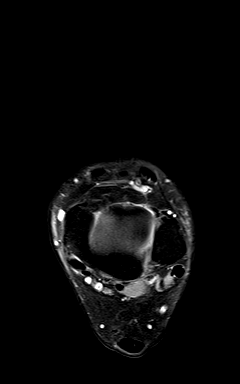
[im 31/36]
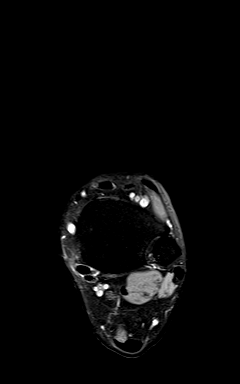
[im 36/36]
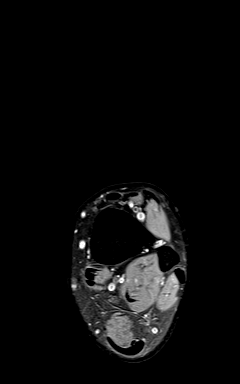

[Series 9: T2 fat-sat · coronal · 3.0mm · 0.31mm/px · 8 of 42 slices shown (2 of 2)]
[im 1/42]
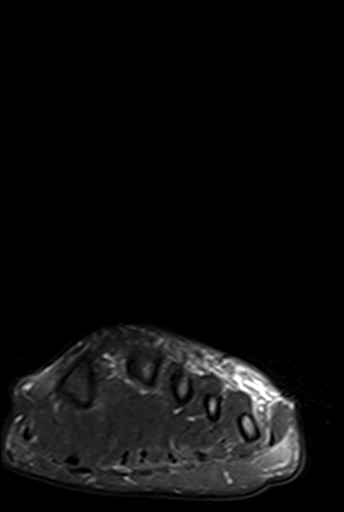
[im 5/42]
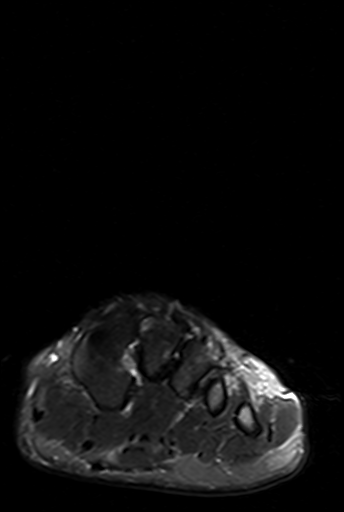
[im 14/42]
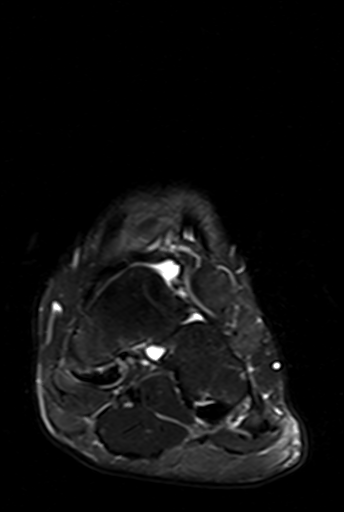
[im 19/42]
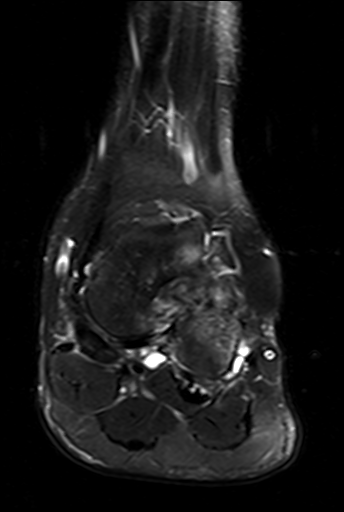
[im 23/42]
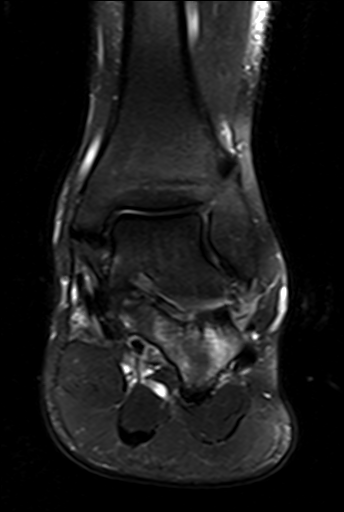
[im 28/42]
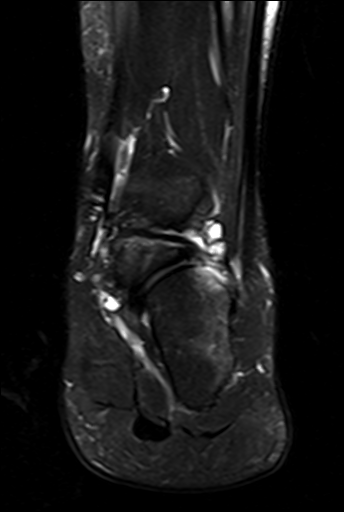
[im 37/42]
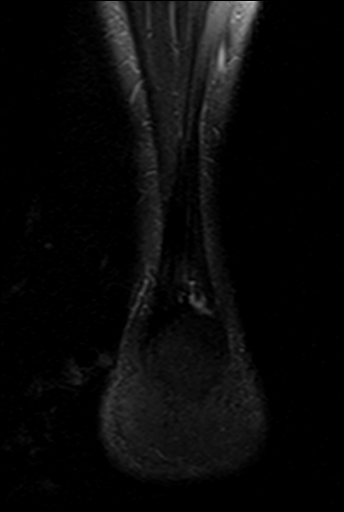
[im 42/42]
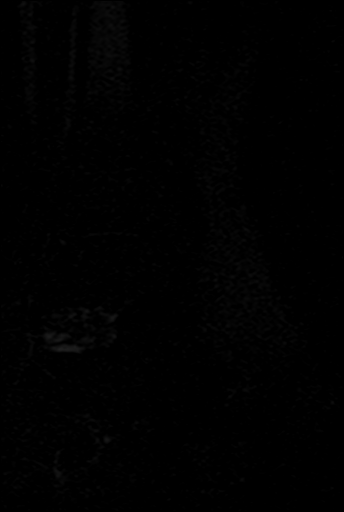

[31 of 40 positions shown; findings below may reference images not displayed]

FINDINGS: TENDONS

Peroneal: Sagittal images demonstrate linear T2 hyperintensity along
the anterior aspect of the peroneus longus tendon (image [DATE]).
There is no corresponding abnormality on the axial images to confirm
[place] split tear. The peroneal tendons are normally located.
There is a small amount of fluid in the peroneal tendon sheath.

Posteromedial: Moderate fluid in the posterior tibialis tendon
sheath. The additional medial flexor tendons appear normal.

Anterior: Intact and normally positioned.

Achilles: Intact. Mild calcaneal spurring at the Achilles
attachment.

Plantar Fascia: Intact.

LIGAMENTS

Lateral: The anterior and posterior talofibular and calcaneofibular
ligaments are intact.

Medial: The deltoid and visualized portions of the spring ligament
appear intact.

CARTILAGE AND BONES

Ankle Joint: No significant ankle joint effusion. The talar dome and
tibial plafond are intact.

Subtalar Joints/Sinus Tarsi: There is diffuse replacement of the
normal fat signal throughout the tarsal sinus, best seen on the
sagittal T1 weighted images. There is adjacent reactive marrow edema
anteriorly in the calcaneus and talus. The subtalar joint
demonstrates no significant findings.

Bones: No significant extra-articular osseous findings.

Other: Mild subcutaneous edema laterally in the forefoot. There are
small ganglia within the plantar aspect of the midfoot along the
plantar slips of the flexor tendons.
IMPRESSION: 1. The primary abnormality is diffuse inflammation throughout the
tarsal sinus consistent with sinus tarsi syndrome. There is reactive
edema in the adjacent calcaneus and talus.
2. Mild posterior tibial tenosynovitis. Possible mild peroneus
longus tenosynovitis and longitudinal split tearing.
3. No acute osseous or ligamentous findings.

## 2019-07-06 ENCOUNTER — Telehealth: Payer: Self-pay | Admitting: Podiatry

## 2019-07-06 MED ORDER — DICLOFENAC SODIUM 75 MG PO TBEC: 75.0000 mg | DELAYED_RELEASE_TABLET | Freq: Two times a day (BID) | ORAL | 1 refills | Status: DC

## 2019-07-06 NOTE — Addendum Note (Signed)
Addended by: Harriett Sine D on: 07/06/2019 03:48 PM   Modules accepted: Orders

## 2019-07-06 NOTE — Telephone Encounter (Signed)
Pt called requesting a refill of diclofenac.   Please send to [facility] in [place].

## 2019-12-03 ENCOUNTER — Other Ambulatory Visit: Payer: Self-pay

## 2019-12-03 ENCOUNTER — Telehealth (INDEPENDENT_AMBULATORY_CARE_PROVIDER_SITE_OTHER): Payer: BC Managed Care – PPO | Admitting: Family Medicine

## 2019-12-03 DIAGNOSIS — E785 Hyperlipidemia, unspecified: Secondary | ICD-10-CM

## 2019-12-03 DIAGNOSIS — M545 Low back pain, unspecified: Secondary | ICD-10-CM

## 2019-12-03 DIAGNOSIS — M79604 Pain in right leg: Secondary | ICD-10-CM

## 2019-12-03 DIAGNOSIS — K219 Gastro-esophageal reflux disease without esophagitis: Secondary | ICD-10-CM

## 2019-12-03 DIAGNOSIS — R739 Hyperglycemia, unspecified: Secondary | ICD-10-CM | POA: Diagnosis not present

## 2019-12-03 DIAGNOSIS — I10 Essential (primary) hypertension: Secondary | ICD-10-CM | POA: Diagnosis not present

## 2019-12-03 MED ORDER — AMLODIPINE BESYLATE 10 MG PO TABS: 10.0000 mg | ORAL_TABLET | Freq: Every day | ORAL | 1 refills | Status: DC

## 2019-12-03 MED ORDER — DICLOFENAC SODIUM 75 MG PO TBEC: 75.0000 mg | DELAYED_RELEASE_TABLET | Freq: Two times a day (BID) | ORAL | 1 refills | Status: AC | PRN

## 2019-12-03 MED ORDER — TRAMADOL HCL 50 MG PO TABS: 50.0000 mg | ORAL_TABLET | Freq: Two times a day (BID) | ORAL | 0 refills | Status: DC | PRN

## 2019-12-03 MED ORDER — EPINEPHRINE 0.3 MG/0.3ML IJ SOAJ: INTRAMUSCULAR | 1 refills | Status: AC

## 2019-12-03 MED ORDER — ROSUVASTATIN CALCIUM 20 MG PO TABS: 20.0000 mg | ORAL_TABLET | Freq: Every day | ORAL | 1 refills | Status: DC

## 2019-12-06 NOTE — Assessment & Plan Note (Signed)
Avoid offending foods, start probiotics. Do not eat large meals in late evening and consider raising head of bed.  

## 2019-12-06 NOTE — Assessment & Plan Note (Addendum)
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 Diclofenac offers some relief.

## 2019-12-06 NOTE — Assessment & Plan Note (Signed)
hgba1c acceptable, minimize simple carbs. Increase exercise as tolerated.  

## 2019-12-06 NOTE — Assessment & Plan Note (Signed)
Monitor and report any concerns, no changes to meds. Encouraged heart healthy diet such as the DASH diet and exercise as tolerated.  ?

## 2019-12-06 NOTE — Progress Notes (Signed)
Virtual Visit via Video Note  I connected with Jason Harper on 12/03/19 at  2:20 PM EDT by a video enabled telemedicine application and verified that I am speaking with the correct person using two identifiers.  Location: Patient: home, patient was in visit Provider: office, physician was in visit   I discussed the limitations of evaluation and management by telemedicine and the availability of in person appointments. The patient expressed understanding and agreed to proceed. Thelma Barge, CMA was able to get the patient set up on a video visit   Subjective:    Patient ID: Jason Harper, male    DOB: [birth year], 49 y.o.   MRN: [id number]  Chief Complaint  Patient presents with  . 6 month follow up    HPI Patient is in today for follow up on chronic medical concerns. No recent febrile illness or hospitalizations. 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 Diclofenac offers some relief temporarilyg. Denies CP/palp/SOB/HA/congestion/fevers/GI or GU c/o. Taking meds as prescribed.   Past Medical History:  Diagnosis Date  . Gout 04/29/2017  . Headache 11/12/2016  . Hearing loss in right ear 03/22/2015   Secondary to Scarlet Fever as child  . High cholesterol   . Hypertension   . Obesity 03/22/2015  . Preventative health care 11/12/2016    No past surgical history on file.  Family History  Problem Relation Age of Onset  . Hyperlipidemia Mother   . Hypertension Mother   . Arthritis Mother   . Hypertension Father   . Hyperlipidemia Father   . Heart disease Father        MI  . Hypertension Brother   . Heart disease Brother        MI    Social History   Socioeconomic History  . Marital status: Legally Separated    Spouse name: Not on file  . Number of children: Not on file  . Years of education: Not on file  . Highest education level: Not on file  Occupational History  . Not on file    Tobacco Use  . Smoking status: Never Smoker  . Smokeless tobacco: Never Used  Substance and Sexual Activity  . Alcohol use: Yes    Alcohol/week: 1.0 standard drinks    Types: 1 Cans of beer per week  . Drug use: No  . 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 Not on file  Social History Narrative  . Not on file   Social Determinants of Health   Financial Resource Strain:   . Difficulty of Paying Living Expenses:   Food Insecurity:   . Worried About Programme researcher, broadcasting/film/video in the Last Year:   . Barista in the Last Year:   Transportation Needs:   . Freight forwarder (Medical):   Marland Kitchen Lack of Transportation (Non-Medical):   Physical Activity:   . Days of Exercise per Week:   . Minutes of Exercise per Session:   Stress:   . Feeling of Stress :   Social Connections:   . Frequency of Communication with Friends and Family:   . Frequency of Social Gatherings with Friends and Family:   . Attends Religious Services:   . Active Member of Clubs or Organizations:   . Attends Banker Meetings:   Marland Kitchen Marital Status:  Intimate Partner Violence:   . Fear of Current or Ex-Partner:   . Emotionally Abused:   Marland Kitchen Physically Abused:   . Sexually Abused:     Outpatient Medications Prior to Visit  Medication Sig Dispense Refill  . Ascorbic Acid (VITAMIN C PO) Take 1 tablet by mouth daily.    Marland Kitchen aspirin EC 81 MG tablet Take 81 mg by mouth daily.    . Zinc 50 MG TABS Take 1 tablet by mouth daily.    Marland Kitchen amLODipine (NORVASC) 10 MG tablet Take 1 tablet (10 mg total) by mouth daily. 90 tablet 1  . diclofenac (VOLTAREN) 75 MG EC tablet Take 1 tablet (75 mg total) by mouth 2 (two) times daily. 60 tablet 1  . EPINEPHrine 0.3 mg/0.3 mL IJ SOAJ injection INJECT CONTENTS OF 1 PEN AS NEEDED FOR ALLERGIC REACTION  1  . escitalopram (LEXAPRO) 10 MG tablet 1/2 tab po daily x 7 days then  increase to 1 tab po daily 30 tablet 3  . fenofibrate (TRICOR) 145 MG tablet TAKE ONE TABLET BY MOUTH ONCE DAILY 30 tablet 5  . rosuvastatin (CRESTOR) 20 MG tablet Take 1 tablet (20 mg total) by mouth daily. 30 tablet 5  . traMADol (ULTRAM) 50 MG tablet Take 1-2 tablets (50-100 mg total) by mouth every 12 (twelve) hours as needed for moderate pain or severe pain. Dz:M54.5 Back Pain 120 tablet 0  . atorvastatin (LIPITOR) 40 MG tablet Take 1 tablet (40 mg total) by mouth daily. 90 tablet 3  . losartan-hydrochlorothiazide (HYZAAR) 100-25 MG tablet Take 1 tablet by mouth once daily 90 tablet 0  . methylPREDNISolone (MEDROL DOSEPAK) 4 MG TBPK tablet 6 day dose pack - take as directed 21 tablet 0  . nitroGLYCERIN (NITRODUR - DOSED IN MG/24 HR) 0.2 mg/hr patch Apply 1/4th patch to affected ankle, change daily 30 patch 1  . Omega-3 Fatty Acids (FISH OIL OMEGA-3 PO) Take 1 tablet by mouth daily.    Marland Kitchen omeprazole (PRILOSEC) 40 MG capsule Take 1 capsule (40 mg total) by mouth daily. 30 capsule 5  . oseltamivir (TAMIFLU) 75 MG capsule Take 1 capsule (75 mg total) by mouth daily. 10 capsule 0  . oxyCODONE-acetaminophen (PERCOCET) 5-325 MG tablet Take 1 tablet by mouth every 6 (six) hours as needed for severe pain. 30 tablet 0  . phenazopyridine (PYRIDIUM) 200 MG tablet Take 1 tablet (200 mg total) by mouth 3 (three) times daily as needed for pain. 6 tablet 0  . potassium chloride SA (K-DUR,KLOR-CON) 20 MEQ tablet Take 2 tablets daily for 5 days, then take 1 tablet by mouth daily 35 tablet 0  . tiZANidine (ZANAFLEX) 4 MG tablet Take 1 tablet (4 mg total) by mouth every 6 (six) hours as needed for muscle spasms. 40 tablet 1   No facility-administered medications prior to visit.    Allergies  Allergen Reactions  . Penicillins Swelling    Review of Systems  Constitutional: Positive for malaise/fatigue. Negative for fever.  HENT: Negative for congestion.   Eyes: Negative for blurred vision.  Respiratory:  Negative for shortness of breath.   Cardiovascular: Negative for chest pain, palpitations and leg swelling.  Gastrointestinal: Negative for abdominal pain, blood in stool and nausea.  Genitourinary: Negative for dysuria and frequency.  Musculoskeletal: Positive for joint pain and myalgias. Negative for falls.  Skin: Negative for rash.  Neurological: Negative for dizziness, loss of consciousness and headaches.  Endo/Heme/Allergies: Negative for environmental allergies.  Psychiatric/Behavioral: Negative for depression. The patient is  not nervous/anxious.        Objective:    Physical Exam Constitutional:      Appearance: Normal appearance. He is obese. He is not ill-appearing.  HENT:     Head: Normocephalic and atraumatic.     Right Ear: External ear normal.     Left Ear: External ear normal.     Nose: Nose normal.  Eyes:     General:        Right eye: No discharge.        Left eye: No discharge.  Pulmonary:     Effort: Pulmonary effort is normal.  Neurological:     Mental Status: He is alert and oriented to person, place, and time.  Psychiatric:        Behavior: Behavior normal.     There were no vitals taken for this visit. Wt Readings from Last 3 Encounters:  10/03/18 231 lb (104.8 kg)  09/22/18 233 lb 3.2 oz (105.8 kg)  09/19/18 230 lb 3.2 oz (104.4 kg)    Diabetic Foot Exam - Simple   No data filed     Lab Results  Component Value Date   WBC 8.7 03/13/2019   HGB 13.2 03/13/2019   HCT 39.2 03/13/2019   PLT 309.0 03/13/2019   GLUCOSE 117 (H) 03/13/2019   CHOL 185 03/13/2019   TRIG 214.0 (H) 03/13/2019   HDL 30.30 (L) 03/13/2019   LDLDIRECT 124.0 03/13/2019   LDLCALC 145 (H) 02/21/2018   ALT 34 03/13/2019   AST 27 03/13/2019   NA 139 03/13/2019   K 4.2 03/13/2019   CL 103 03/13/2019   CREATININE 0.84 03/13/2019   BUN 9 03/13/2019   CO2 28 03/13/2019   TSH 2.99 05/30/2018   PSA 0.9 07/26/2014   HGBA1C 5.6 03/13/2019    Lab Results  Component  Value Date   TSH 2.99 05/30/2018   Lab Results  Component Value Date   WBC 8.7 03/13/2019   HGB 13.2 03/13/2019   HCT 39.2 03/13/2019   MCV 84.1 03/13/2019   PLT 309.0 03/13/2019   Lab Results  Component Value Date   NA 139 03/13/2019   K 4.2 03/13/2019   CO2 28 03/13/2019   GLUCOSE 117 (H) 03/13/2019   BUN 9 03/13/2019   CREATININE 0.84 03/13/2019   BILITOT 0.4 03/13/2019   ALKPHOS 60 03/13/2019   AST 27 03/13/2019   ALT 34 03/13/2019   PROT 6.8 03/13/2019   ALBUMIN 4.2 03/13/2019   CALCIUM 9.1 03/13/2019   GFR 98.31 03/13/2019   Lab Results  Component Value Date   CHOL 185 03/13/2019   Lab Results  Component Value Date   HDL 30.30 (L) 03/13/2019   Lab Results  Component Value Date   LDLCALC 145 (H) 02/21/2018   Lab Results  Component Value Date   TRIG 214.0 (H) 03/13/2019   Lab Results  Component Value Date   CHOLHDL 6 03/13/2019   Lab Results  Component Value Date   HGBA1C 5.6 03/13/2019       Assessment & Plan:   Problem List Items Addressed This Visit    Hypertension - Primary    Monitor and report any concerns, no changes to meds. Encouraged heart healthy diet such as the DASH diet and exercise as tolerated.       Relevant Medications   rosuvastatin (CRESTOR) 20 MG tablet   amLODipine (NORVASC) 10 MG tablet   EPINEPHrine 0.3 mg/0.3 mL IJ SOAJ injection   Other Relevant Orders  CBC   TSH   Hyperlipidemia with target LDL less than 100   Relevant Medications   rosuvastatin (CRESTOR) 20 MG tablet   amLODipine (NORVASC) 10 MG tablet   EPINEPHrine 0.3 mg/0.3 mL IJ SOAJ injection   Other Relevant Orders   Lipid panel   GERD (gastroesophageal reflux disease)    Avoid offending foods, start probiotics. Do not eat large meals in late evening and consider raising head of bed.       Hyperglycemia    hgba1c acceptable, minimize simple carbs. Increase exercise as tolerated.       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 Diclofenac offers some relief.        Other Visit Diagnoses    Low back pain       Relevant Medications   traMADol (ULTRAM) 50 MG tablet   diclofenac (VOLTAREN) 75 MG EC tablet      I have discontinued Jason Harper's escitalopram, Omega-3 Fatty Acids (FISH OIL OMEGA-3 PO), nitroGLYCERIN, potassium chloride SA, omeprazole, fenofibrate, tiZANidine, phenazopyridine, oseltamivir, losartan-hydrochlorothiazide, methylPREDNISolone, oxyCODONE-acetaminophen, and atorvastatin. I have also changed his diclofenac. Additionally, I am having him maintain his aspirin EC, Zinc, Ascorbic Acid (VITAMIN C PO), rosuvastatin, amLODipine, traMADol, and EPINEPHrine.  Meds ordered this encounter  Medications  . rosuvastatin (CRESTOR) 20 MG tablet    Sig: Take 1 tablet (20 mg total) by mouth daily.    Dispense:  90 tablet    Refill:  1    Please consider 90 day supplies to promote better adherence  . amLODipine (NORVASC) 10 MG tablet    Sig: Take 1 tablet (10 mg total) by mouth daily.    Dispense:  90 tablet    Refill:  1  . traMADol (ULTRAM) 50 MG tablet    Sig: Take 1-2 tablets (50-100 mg total) by mouth every 12 (twelve) hours as needed for moderate pain or severe pain. Dz:M54.5 Back Pain    Dispense:  120 tablet    Refill:  0  . diclofenac (VOLTAREN) 75 MG EC tablet    Sig: Take 1 tablet (75 mg total) by mouth 2 (two) times daily as needed.    Dispense:  90 tablet    Refill:  1  . EPINEPHrine 0.3 mg/0.3 mL IJ SOAJ injection    Sig: INJECT CONTENTS OF 1 PEN AS NEEDED FOR ALLERGIC REACTION    Dispense:  1 each    Refill:  1       I discussed the assessment and treatment plan with the patient. The patient was provided an opportunity to ask questions and all were answered. The patient agreed with the plan and demonstrated an understanding of the  instructions.   The patient was advised to call back or seek an in-person evaluation if the symptoms worsen or if the condition fails to improve as anticipated.  I provided 20 minutes of non-face-to-face time during this encounter.   Danise Edge, MD

## 2019-12-10 ENCOUNTER — Telehealth: Payer: Self-pay | Admitting: *Deleted

## 2019-12-10 NOTE — Telephone Encounter (Signed)
Left message on machine to schedule appointment.  Need to schedule CPE around 10/4 and lab only appointment around 7/6  Left message that we would be unable to do CPE around 10/4 and next available would be in December.

## 2020-01-19 ENCOUNTER — Other Ambulatory Visit (INDEPENDENT_AMBULATORY_CARE_PROVIDER_SITE_OTHER): Payer: BC Managed Care – PPO

## 2020-01-19 ENCOUNTER — Other Ambulatory Visit: Payer: Self-pay

## 2020-01-19 DIAGNOSIS — E785 Hyperlipidemia, unspecified: Secondary | ICD-10-CM

## 2020-01-19 DIAGNOSIS — I10 Essential (primary) hypertension: Secondary | ICD-10-CM

## 2020-01-19 DIAGNOSIS — R739 Hyperglycemia, unspecified: Secondary | ICD-10-CM

## 2020-01-19 LAB — COMPREHENSIVE METABOLIC PANEL
ALT: 23 U/L (ref 0–53)
AST: 21 U/L (ref 0–37)
Albumin: 4.2 g/dL (ref 3.5–5.2)
Alkaline Phosphatase: 61 U/L (ref 39–117)
BUN: 12 mg/dL (ref 6–23)
CO2: 30 mEq/L (ref 19–32)
Calcium: 8.9 mg/dL (ref 8.4–10.5)
Chloride: 102 mEq/L (ref 96–112)
Creatinine, Ser: 0.93 mg/dL (ref 0.40–1.50)
GFR: 87.09 mL/min (ref >60.00)
Glucose, Bld: 104 mg/dL — ABNORMAL HIGH (ref 70–99)
Potassium: 3.6 mEq/L (ref 3.5–5.1)
Sodium: 142 mEq/L (ref 135–145)
Total Bilirubin: 0.5 mg/dL (ref 0.2–1.2)
Total Protein: 6.7 g/dL (ref 6.0–8.3)

## 2020-01-19 LAB — LIPID PANEL
Cholesterol: 148 mg/dL (ref 0–200)
HDL: 29.4 mg/dL — ABNORMAL LOW (ref >39.00)
LDL Cholesterol: 86 mg/dL (ref 0–99)
NonHDL: 119.06
Total CHOL/HDL Ratio: 5
Triglycerides: 165 mg/dL — ABNORMAL HIGH (ref 0.0–149.0)
VLDL: 33 mg/dL (ref 0.0–40.0)

## 2020-01-19 LAB — CBC
HCT: 39 % (ref 39.0–52.0)
Hemoglobin: 13.4 g/dL (ref 13.0–17.0)
MCHC: 34.3 g/dL (ref 30.0–36.0)
MCV: 82.2 fl (ref 78.0–100.0)
Platelets: 306 10*3/uL (ref 150.0–400.0)
RBC: 4.74 Mil/uL (ref 4.22–5.81)
RDW: 13.9 % (ref 11.5–15.5)
WBC: 8.6 10*3/uL (ref 4.0–10.5)

## 2020-01-19 LAB — HEMOGLOBIN A1C: Hgb A1c MFr Bld: 5.8 % (ref 4.6–6.5)

## 2020-01-19 LAB — TSH: TSH: 3.53 u[IU]/mL (ref 0.35–4.50)

## 2020-04-22 ENCOUNTER — Other Ambulatory Visit: Payer: Self-pay | Admitting: Family Medicine

## 2020-04-22 DIAGNOSIS — M545 Low back pain, unspecified: Secondary | ICD-10-CM

## 2020-04-22 MED ORDER — TRAMADOL HCL 50 MG PO TABS: 50.0000 mg | ORAL_TABLET | Freq: Two times a day (BID) | ORAL | 0 refills | Status: DC | PRN

## 2020-04-22 NOTE — Telephone Encounter (Signed)
Last written:12/03/19 Last ov:01/30/19 Next ov:06/27/20 Contract:none UDS:11/12/17

## 2020-06-27 ENCOUNTER — Encounter: Payer: BC Managed Care – PPO | Admitting: Family Medicine

## 2020-06-27 ENCOUNTER — Encounter: Payer: Self-pay | Admitting: Medical

## 2020-06-27 ENCOUNTER — Other Ambulatory Visit: Payer: Self-pay | Admitting: Medical

## 2020-06-27 ENCOUNTER — Other Ambulatory Visit: Payer: Self-pay

## 2020-06-27 ENCOUNTER — Ambulatory Visit (INDEPENDENT_AMBULATORY_CARE_PROVIDER_SITE_OTHER): Payer: BC Managed Care – PPO | Admitting: Medical

## 2020-06-27 ENCOUNTER — Telehealth: Payer: Self-pay | Admitting: Medical

## 2020-06-27 VITALS — BP 139/80 | HR 76 | Temp 98.1°F | Resp 18 | Ht 65.0 in | Wt 225.0 lb

## 2020-06-27 DIAGNOSIS — Z1211 Encounter for screening for malignant neoplasm of colon: Secondary | ICD-10-CM | POA: Diagnosis not present

## 2020-06-27 DIAGNOSIS — Z0000 Encounter for general adult medical examination without abnormal findings: Secondary | ICD-10-CM

## 2020-06-27 DIAGNOSIS — I10 Essential (primary) hypertension: Secondary | ICD-10-CM

## 2020-06-27 LAB — COMPREHENSIVE METABOLIC PANEL
ALT: 27 U/L (ref 0–53)
AST: 23 U/L (ref 0–37)
Albumin: 4.4 g/dL (ref 3.5–5.2)
Alkaline Phosphatase: 67 U/L (ref 39–117)
BUN: 14 mg/dL (ref 6–23)
CO2: 30 mEq/L (ref 19–32)
Calcium: 9 mg/dL (ref 8.4–10.5)
Chloride: 102 mEq/L (ref 96–112)
Creatinine, Ser: 0.86 mg/dL (ref 0.40–1.50)
GFR: 103.16 mL/min (ref >60.00)
Glucose, Bld: 96 mg/dL (ref 70–99)
Potassium: 3.3 mEq/L — ABNORMAL LOW (ref 3.5–5.1)
Sodium: 139 mEq/L (ref 135–145)
Total Bilirubin: 0.5 mg/dL (ref 0.2–1.2)
Total Protein: 7.3 g/dL (ref 6.0–8.3)

## 2020-06-27 LAB — CBC WITH DIFFERENTIAL/PLATELET
Basophils Absolute: 0.1 10*3/uL (ref 0.0–0.1)
Basophils Relative: 1.1 % (ref 0.0–3.0)
Eosinophils Absolute: 1 10*3/uL — ABNORMAL HIGH (ref 0.0–0.7)
Eosinophils Relative: 10.5 % — ABNORMAL HIGH (ref 0.0–5.0)
HCT: 41.8 % (ref 39.0–52.0)
Hemoglobin: 14.1 g/dL (ref 13.0–17.0)
Lymphocytes Relative: 26.6 % (ref 12.0–46.0)
Lymphs Abs: 2.7 10*3/uL (ref 0.7–4.0)
MCHC: 33.6 g/dL (ref 30.0–36.0)
MCV: 83.2 fl (ref 78.0–100.0)
Monocytes Absolute: 0.7 10*3/uL (ref 0.1–1.0)
Monocytes Relative: 6.8 % (ref 3.0–12.0)
Neutro Abs: 5.5 10*3/uL (ref 1.4–7.7)
Neutrophils Relative %: 55 % (ref 43.0–77.0)
Platelets: 312 10*3/uL (ref 150.0–400.0)
RBC: 5.03 Mil/uL (ref 4.22–5.81)
RDW: 13.8 % (ref 11.5–15.5)
WBC: 10 10*3/uL (ref 4.0–10.5)

## 2020-06-27 LAB — LIPID PANEL
Cholesterol: 159 mg/dL (ref 0–200)
HDL: 33.3 mg/dL — ABNORMAL LOW (ref >39.00)
LDL Cholesterol: 96 mg/dL (ref 0–99)
NonHDL: 125.35
Total CHOL/HDL Ratio: 5
Triglycerides: 148 mg/dL (ref 0.0–149.0)
VLDL: 29.6 mg/dL (ref 0.0–40.0)

## 2020-06-27 MED ORDER — POTASSIUM CHLORIDE ER 10 MEQ PO TBCR: EXTENDED_RELEASE_TABLET | ORAL | 0 refills | Status: DC

## 2020-06-27 NOTE — Patient Instructions (Addendum)
For you wellness exam today I have ordered cbc, cmp and lipid panel.  Vaccine up to date.  Recommend exercise and healthy diet.  We will let you know lab results as they come in.  Follow up date appointment will be determined after lab review.    Preventive Care 59-47 Years Old, Male Preventive care refers to lifestyle choices and visits with your health care provider that can promote health and wellness. This includes:  A yearly physical exam. This is also called an annual well check.  Regular dental and eye exams.  Immunizations.  Screening for certain conditions.  Healthy lifestyle choices, such as eating a healthy diet, getting regular exercise, not using drugs or products that contain nicotine and tobacco, and limiting alcohol use. What can I expect for my preventive care visit? Physical exam Your health care provider will check:  Height and weight. These may be used to calculate body mass index (BMI), which is a measurement that tells if you are at a healthy weight.  Heart rate and blood pressure.  Your skin for abnormal spots. Counseling Your health care provider may ask you questions about:  Alcohol, tobacco, and drug use.  Emotional well-being.  Home and relationship well-being.  Sexual activity.  Eating habits.  Work and work Statistician. What immunizations do I need?  Influenza (flu) vaccine  This is recommended every year. Tetanus, diphtheria, and pertussis (Tdap) vaccine  You may need a Td booster every 10 years. Varicella (chickenpox) vaccine  You may need this vaccine if you have not already been vaccinated. Zoster (shingles) vaccine  You may need this after age 88. Measles, mumps, and rubella (MMR) vaccine  You may need at least one dose of MMR if you were born in 1957 or later. You may also need a second dose. Pneumococcal conjugate (PCV13) vaccine  You may need this if you have certain conditions and were not previously  vaccinated. Pneumococcal polysaccharide (PPSV23) vaccine  You may need one or two doses if you smoke cigarettes or if you have certain conditions. Meningococcal conjugate (MenACWY) vaccine  You may need this if you have certain conditions. Hepatitis A vaccine  You may need this if you have certain conditions or if you travel or work in places where you may be exposed to hepatitis A. Hepatitis B vaccine  You may need this if you have certain conditions or if you travel or work in places where you may be exposed to hepatitis B. Haemophilus influenzae type b (Hib) vaccine  You may need this if you have certain risk factors. Human papillomavirus (HPV) vaccine  If recommended by your health care provider, you may need three doses over 6 months. You may receive vaccines as individual doses or as more than one vaccine together in one shot (combination vaccines). Talk with your health care provider about the risks and benefits of combination vaccines. What tests do I need? Blood tests  Lipid and cholesterol levels. These may be checked every 5 years, or more frequently if you are over 65 years old.  Hepatitis C test.  Hepatitis B test. Screening  Lung cancer screening. You may have this screening every year starting at age 77 if you have a 30-pack-year history of smoking and currently smoke or have quit within the past 15 years.  Prostate cancer screening. Recommendations will vary depending on your family history and other risks.  Colorectal cancer screening. All adults should have this screening starting at age 21 and continuing until age 36.  Your health care provider may recommend screening at age 50 if you are at increased risk. You will have tests every 1-10 years, depending on your results and the type of screening test.  Diabetes screening. This is done by checking your blood sugar (glucose) after you have not eaten for a while (fasting). You may have this done every 1-3  years.  Sexually transmitted disease (STD) testing. Follow these instructions at home: Eating and drinking  Eat a diet that includes fresh fruits and vegetables, whole grains, lean protein, and low-fat dairy products.  Take vitamin and mineral supplements as recommended by your health care provider.  Do not drink alcohol if your health care provider tells you not to drink.  If you drink alcohol: ? Limit how much you have to 0-2 drinks a day. ? Be aware of how much alcohol is in your drink. In the [place] one drink equals one 12 oz bottle of beer (355 mL), one 5 oz glass of wine (148 mL), or one 1 oz glass of hard liquor (44 mL). Lifestyle  Take daily care of your teeth and gums.  Stay active. Exercise for at least 30 minutes on 5 or more days each week.  Do not use any products that contain nicotine or tobacco, such as cigarettes, e-cigarettes, and chewing tobacco. If you need help quitting, ask your health care provider.  If you are sexually active, practice safe sex. Use a condom or other form of protection to prevent STIs (sexually transmitted infections).  Talk with your health care provider about taking a low-dose aspirin every day starting at age 52. What's next?  Go to your health care provider once a year for a well check visit.  Ask your health care provider how often you should have your eyes and teeth checked.  Stay up to date on all vaccines. This information is not intended to replace advice given to you by your health care provider. Make sure you discuss any questions you have with your health care provider. Document Revised: 06/26/2018 Document Reviewed: 06/26/2018 Elsevier Patient Education  2020 [employer].

## 2020-06-27 NOTE — Progress Notes (Signed)
Subjective:    Patient ID: Jason Harper, male    DOB: Nov 14, 1970, 49 y.o.   MRN: [id number]  HPI  Pt in for cpe/wellness exam.  Pt works at [employer], Pt does not exercise. Pt states eats one meal a day and snacks on healthy foods. However does drink 3 sodas a day.         Review of Systems  Constitutional: Negative for chills, fatigue and fever.  Respiratory: Negative for cough, chest tightness, shortness of breath and wheezing.   Cardiovascular: Negative for chest pain and palpitations.  Gastrointestinal: Negative for abdominal pain.  Genitourinary: Negative for dysuria.  Musculoskeletal: Negative for back pain.  Skin: Negative for rash.  Neurological: Negative for dizziness, speech difficulty, weakness, numbness and headaches.  Hematological: Negative for adenopathy. Does not bruise/bleed easily.  Psychiatric/Behavioral: Negative for behavioral problems, decreased concentration and hallucinations. The patient is not nervous/anxious.     Past Medical History:  Diagnosis Date  . Gout 04/29/2017  . Headache 11/12/2016  . Hearing loss in right ear 03/22/2015   Secondary to Scarlet Fever as child  . High cholesterol   . Hypertension   . Obesity 03/22/2015  . Preventative health care 11/12/2016     Social History   Socioeconomic History  . Marital status: Legally Separated    Spouse name: Not on file  . Number of children: Not on file  . Years of education: Not on file  . Highest education level: Not on file  Occupational History  . Not on file  Tobacco Use  . Smoking status: Never Smoker  . Smokeless tobacco: Never Used  Vaping Use  . Vaping Use: Never used  Substance and Sexual Activity  . Alcohol use: Yes    Alcohol/week: 1.0 standard drink    Types: 1 Cans of beer per week  . Drug use: No  . 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 Not on file  Social History  Narrative  . Not on file   Social Determinants of Health   Financial Resource Strain: Not on file  Food Insecurity: Not on file  Transportation Needs: Not on file  Physical Activity: Not on file  Stress: Not on file  Social Connections: Not on file  Intimate Partner Violence: Not on file    No past surgical history on file.  Family History  Problem Relation Age of Onset  . Hyperlipidemia Mother   . Hypertension Mother   . Arthritis Mother   . Hypertension Father   . Hyperlipidemia Father   . Heart disease Father        MI  . Hypertension Brother   . Heart disease Brother        MI    Allergies  Allergen Reactions  . Penicillins Swelling    Current Outpatient Medications on File Prior to Visit  Medication Sig Dispense Refill  . amLODipine (NORVASC) 10 MG tablet Take 1 tablet (10 mg total) by mouth daily. 90 tablet 1  . Ascorbic Acid (VITAMIN C PO) Take 1 tablet by mouth daily.    Marland Kitchen aspirin EC 81 MG tablet Take 81 mg by mouth daily.    . diclofenac (VOLTAREN) 75 MG EC tablet Take 1 tablet (75 mg total) by mouth 2 (two) times daily as needed. 90 tablet 1  . EPINEPHrine 0.3 mg/0.3 mL IJ SOAJ injection INJECT CONTENTS OF 1 PEN AS NEEDED FOR  ALLERGIC REACTION 1 each 1  . rosuvastatin (CRESTOR) 20 MG tablet Take 1 tablet (20 mg total) by mouth daily. 90 tablet 1  . traMADol (ULTRAM) 50 MG tablet Take 1-2 tablets (50-100 mg total) by mouth every 12 (twelve) hours as needed for moderate pain or severe pain. Dz:M54.5 Back Pain 120 tablet 0  . Zinc 50 MG TABS Take 1 tablet by mouth daily.     No current facility-administered medications on file prior to visit.    BP 138/85   Pulse 76   Temp 98.1 F (36.7 C) (Oral)   Resp 18   Ht 5\' 5"  (1.651 m)   Wt 225 lb (102.1 kg)   SpO2 99%   BMI 37.44 kg/m       Objective:   Physical Exam  General Mental Status- Alert. General Appearance- Not in acute distress.   Skin General: Color- Normal Color. Moisture- Normal  Moisture.  Neck Carotid Arteries- Normal color. Moisture- Normal Moisture. No carotid bruits. No JVD.  Chest and Lung Exam Auscultation: Breath Sounds:-Normal.  Cardiovascular Auscultation:Rythm- Regular. Murmurs & Other Heart Sounds:Auscultation of the heart reveals- No Murmurs.  Abdomen Inspection:-Inspeection Normal. Palpation/Percussion:Note:No mass. Palpation and Percussion of the abdomen reveal- Non Tender, Non Distended + BS, no rebound or guarding.    Neurologic Cranial Nerve exam:- CN III-XII intact(No nystagmus), symmetric smile. Strength:- 5/5 equal and symmetric strength both upper and lower extremities.      Assessment & Plan:  For you wellness exam today I have ordered cbc, cmp and lipid panel.  Vaccine up to date.  Recommend exercise and healthy diet.  We will let you know lab results as they come in.  Follow up date appointment will be determined after lab review.  , PA-C

## 2020-06-27 NOTE — Telephone Encounter (Signed)
kdur sent to pt pharmacy.

## 2020-06-28 MED FILL — POTASSIUM CL ER 10 MEQ TAB: 10 | 3 days supply | Qty: 3 | Fill #0

## 2020-07-20 ENCOUNTER — Other Ambulatory Visit: Payer: Self-pay | Admitting: Family Medicine

## 2020-07-20 DIAGNOSIS — M545 Low back pain, unspecified: Secondary | ICD-10-CM

## 2020-07-20 MED ORDER — TRAMADOL HCL 50 MG PO TABS: 50.0000 mg | ORAL_TABLET | Freq: Two times a day (BID) | ORAL | 0 refills | Status: DC | PRN

## 2020-07-20 NOTE — Telephone Encounter (Signed)
Requesting: tramadol 50mg  Contract: 04/29/2017 UDS: 11/12/2017 Last Visit: 06/27/2020 Next Visit: 01/02/2021 Last Refill: 04/22/2020 #120 and 0RF  Please Advise

## 2020-07-22 ENCOUNTER — Other Ambulatory Visit: Payer: Self-pay | Admitting: Family Medicine

## 2020-07-22 DIAGNOSIS — I10 Essential (primary) hypertension: Secondary | ICD-10-CM

## 2020-09-12 ENCOUNTER — Encounter: Payer: Self-pay | Admitting: Medical

## 2020-11-26 ENCOUNTER — Other Ambulatory Visit: Payer: Self-pay | Admitting: Family Medicine

## 2020-11-26 DIAGNOSIS — M545 Low back pain, unspecified: Secondary | ICD-10-CM

## 2020-11-28 MED ORDER — TRAMADOL HCL 50 MG PO TABS: 50.0000 mg | ORAL_TABLET | Freq: Two times a day (BID) | ORAL | 0 refills | Status: DC | PRN

## 2020-11-28 NOTE — Telephone Encounter (Signed)
Requesting: tramadol 50mg   Contract: 04/29/2017 UDS: 11/12/2017 Last Visit: 06/27/2020 w/ 06/29/2020  Next Visit: 01/02/2021 Last Refill: 07/20/2020 #120 and 0RF Pt sig; 1-2 tab q12h prn  Please Advise

## 2021-01-02 ENCOUNTER — Ambulatory Visit: Payer: BC Managed Care – PPO | Admitting: Family Medicine

## 2021-01-02 ENCOUNTER — Other Ambulatory Visit: Payer: Self-pay

## 2021-01-02 VITALS — BP 130/72 | HR 75 | Temp 98.1°F | Resp 16 | Wt 227.2 lb

## 2021-01-02 DIAGNOSIS — Z79899 Other long term (current) drug therapy: Secondary | ICD-10-CM

## 2021-01-02 DIAGNOSIS — R739 Hyperglycemia, unspecified: Secondary | ICD-10-CM | POA: Diagnosis not present

## 2021-01-02 DIAGNOSIS — F4321 Adjustment disorder with depressed mood: Secondary | ICD-10-CM

## 2021-01-02 DIAGNOSIS — E785 Hyperlipidemia, unspecified: Secondary | ICD-10-CM | POA: Diagnosis not present

## 2021-01-02 DIAGNOSIS — M25551 Pain in right hip: Secondary | ICD-10-CM

## 2021-01-02 DIAGNOSIS — I10 Essential (primary) hypertension: Secondary | ICD-10-CM

## 2021-01-02 DIAGNOSIS — E6609 Other obesity due to excess calories: Secondary | ICD-10-CM

## 2021-01-02 LAB — COMPREHENSIVE METABOLIC PANEL
ALT: 27 U/L (ref 0–53)
AST: 24 U/L (ref 0–37)
Albumin: 4.6 g/dL (ref 3.5–5.2)
Alkaline Phosphatase: 66 U/L (ref 39–117)
BUN: 11 mg/dL (ref 6–23)
CO2: 30 mEq/L (ref 19–32)
Calcium: 9.1 mg/dL (ref 8.4–10.5)
Chloride: 100 mEq/L (ref 96–112)
Creatinine, Ser: 0.82 mg/dL (ref 0.40–1.50)
GFR: 104.28 mL/min (ref >60.00)
Glucose, Bld: 107 mg/dL — ABNORMAL HIGH (ref 70–99)
Potassium: 3.7 mEq/L (ref 3.5–5.1)
Sodium: 139 mEq/L (ref 135–145)
Total Bilirubin: 0.5 mg/dL (ref 0.2–1.2)
Total Protein: 7.6 g/dL (ref 6.0–8.3)

## 2021-01-02 LAB — LIPID PANEL
Cholesterol: 163 mg/dL (ref 0–200)
HDL: 32.8 mg/dL — ABNORMAL LOW (ref >39.00)
NonHDL: 129.9
Total CHOL/HDL Ratio: 5
Triglycerides: 215 mg/dL — ABNORMAL HIGH (ref 0.0–149.0)
VLDL: 43 mg/dL — ABNORMAL HIGH (ref 0.0–40.0)

## 2021-01-02 LAB — CBC
HCT: 44.1 % (ref 39.0–52.0)
Hemoglobin: 15 g/dL (ref 13.0–17.0)
MCHC: 34.1 g/dL (ref 30.0–36.0)
MCV: 82.6 fl (ref 78.0–100.0)
Platelets: 342 10*3/uL (ref 150.0–400.0)
RBC: 5.33 Mil/uL (ref 4.22–5.81)
RDW: 13.8 % (ref 11.5–15.5)
WBC: 9 10*3/uL (ref 4.0–10.5)

## 2021-01-02 LAB — TSH: TSH: 2.92 u[IU]/mL (ref 0.35–4.50)

## 2021-01-02 LAB — HEMOGLOBIN A1C: Hgb A1c MFr Bld: 6 % (ref 4.6–6.5)

## 2021-01-02 LAB — LDL CHOLESTEROL, DIRECT: Direct LDL: 106 mg/dL

## 2021-01-02 NOTE — Assessment & Plan Note (Signed)
His mother died in March and he is trying to clean up her estate presently. He feels he is managing well enough at this time and will let us know if his grief overwhelms him

## 2021-01-02 NOTE — Assessment & Plan Note (Signed)
Encouraged moist heat and gentle stretching as tolerated. May try NSAIDs and prescription meds as directed and report if symptoms Worsen or seek immediate care. Gets relief from Tramadol prn may continue

## 2021-01-02 NOTE — Progress Notes (Signed)
Subjective:    Patient ID: Jason Harper, male    DOB: July 09, 1971, 50 y.o.   MRN: [id number]  Chief Complaint  Patient presents with   Follow-up   Hypertension    HPI Patient is in today for follow up on chronic medical concerns including hyperglycemia, hyperlipidemia and obesity. No recent febrile illness or hospitalizations. No polyuria or polydipsia. 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 No recent fall or injuries.Denies CP/palp/SOB/HA/congestion/fevers/GI or GU c/o. Taking meds as prescribed   Past Medical History:  Diagnosis Date   Gout 04/29/2017   Headache 11/12/2016   Hearing loss in right ear 03/22/2015   Secondary to Scarlet Fever as child   High cholesterol    Hypertension    Obesity 03/22/2015   Preventative health care 11/12/2016    No past surgical history on file.  Family History  Problem Relation Age of Onset   Hyperlipidemia Mother    Hypertension Mother    Arthritis Mother    Hypertension Father    Hyperlipidemia Father    Heart disease Father        MI   Hypertension Brother    Heart disease Brother        MI    Social History   Socioeconomic History   Marital status: Legally Separated    Spouse name: Not on file   Number of children: Not on file   Years of education: Not on file   Highest education level: Not on file  Occupational History   Not on file  Tobacco Use   Smoking status: Never   Smokeless tobacco: Never  Vaping Use   Vaping Use: Never used  Substance and Sexual Activity   Alcohol use: Yes    Alcohol/week: 1.0 standard drink    Types: 1 Cans of beer per week   Drug use: No   Sexual activity: Not on file    Comment: lives alon, no dietary restrictions, works Quarry manager job  Other Topics Concern   Not on file  Social History Narrative   Not on file   Social Determinants of Health   Financial Resource Strain: Not on file  Food Insecurity: Not on file   Transportation Needs: Not on file  Physical Activity: Not on file  Stress: Not on file  Social Connections: Not on file  Intimate Partner Violence: Not on file    Outpatient Medications Prior to Visit  Medication Sig Dispense Refill   amLODipine (NORVASC) 10 MG tablet Take 1 tablet (10 mg total) by mouth daily. 90 tablet 1   Ascorbic Acid (VITAMIN C PO) Take 1 tablet by mouth daily.     aspirin EC 81 MG tablet Take 81 mg by mouth daily.     diclofenac (VOLTAREN) 75 MG EC tablet Take 1 tablet (75 mg total) by mouth 2 (two) times daily as needed. 90 tablet 1   EPINEPHrine 0.3 mg/0.3 mL IJ SOAJ injection INJECT CONTENTS OF 1 PEN AS NEEDED FOR ALLERGIC REACTION 1 each 1   potassium chloride (KLOR-CON) 10 MEQ tablet TAKE 1 TABLET BY MOUTH ONCE DAILY 3 tablet 0   rosuvastatin (CRESTOR) 20 MG tablet Take 1 tablet (20 mg total) by mouth daily. 90 tablet 1   traMADol (ULTRAM) 50 MG tablet Take 1-2 tablets (50-100 mg total) by mouth every 12 (twelve) hours as needed for moderate pain or severe pain. Dz:M54.5 Back Pain 120 tablet 0   Zinc 50 MG TABS  Take 1 tablet by mouth daily.     No facility-administered medications prior to visit.    Allergies  Allergen Reactions   Penicillins Swelling    Review of Systems  Constitutional:  Positive for malaise/fatigue. Negative for fever.  HENT:  Negative for congestion.   Eyes:  Negative for blurred vision.  Respiratory:  Negative for shortness of breath.   Cardiovascular:  Negative for chest pain, palpitations and leg swelling.  Gastrointestinal:  Negative for abdominal pain, blood in stool and nausea.  Genitourinary:  Negative for dysuria and frequency.  Musculoskeletal:  Positive for joint pain. Negative for falls.  Skin:  Negative for rash.  Neurological:  Negative for dizziness, loss of consciousness and headaches.  Endo/Heme/Allergies:  Negative for environmental allergies.  Psychiatric/Behavioral:  Negative for depression. The patient is  nervous/anxious.       Objective:    Physical Exam Constitutional:      General: He is not in acute distress.    Appearance: Normal appearance. He is not ill-appearing or toxic-appearing.  HENT:     Head: Normocephalic and atraumatic.     Right Ear: External ear normal.     Left Ear: External ear normal.     Nose: Nose normal.  Eyes:     General:        Right eye: No discharge.        Left eye: No discharge.  Cardiovascular:     Rate and Rhythm: Normal rate and regular rhythm.     Heart sounds: Normal heart sounds.  Pulmonary:     Effort: Pulmonary effort is normal.  Abdominal:     Palpations: There is no mass.  Musculoskeletal:     Right lower leg: No edema.     Left lower leg: No edema.  Skin:    Findings: No rash.  Neurological:     Mental Status: He is alert and oriented to person, place, and time.  Psychiatric:        Behavior: Behavior normal.    BP 130/72   Pulse 75   Temp 98.1 F (36.7 C)   Resp 16   Wt 227 lb 3.2 oz (103.1 kg)   SpO2 98%   BMI 37.81 kg/m  Wt Readings from Last 3 Encounters:  01/02/21 227 lb 3.2 oz (103.1 kg)  06/27/20 225 lb (102.1 kg)  10/03/18 231 lb (104.8 kg)    Diabetic Foot Exam - Simple   No data filed    Lab Results  Component Value Date   WBC 9.0 01/02/2021   HGB 15.0 01/02/2021   HCT 44.1 01/02/2021   PLT 342.0 01/02/2021   GLUCOSE 107 (H) 01/02/2021   CHOL 163 01/02/2021   TRIG 215.0 (H) 01/02/2021   HDL 32.80 (L) 01/02/2021   LDLDIRECT 106.0 01/02/2021   LDLCALC 96 06/27/2020   ALT 27 01/02/2021   AST 24 01/02/2021   NA 139 01/02/2021   K 3.7 01/02/2021   CL 100 01/02/2021   CREATININE 0.82 01/02/2021   BUN 11 01/02/2021   CO2 30 01/02/2021   TSH 2.92 01/02/2021   PSA 0.9 07/26/2014   HGBA1C 6.0 01/02/2021    Lab Results  Component Value Date   TSH 2.92 01/02/2021   Lab Results  Component Value Date   WBC 9.0 01/02/2021   HGB 15.0 01/02/2021   HCT 44.1 01/02/2021   MCV 82.6 01/02/2021   PLT  342.0 01/02/2021   Lab Results  Component Value Date   NA 139  01/02/2021   K 3.7 01/02/2021   CO2 30 01/02/2021   GLUCOSE 107 (H) 01/02/2021   BUN 11 01/02/2021   CREATININE 0.82 01/02/2021   BILITOT 0.5 01/02/2021   ALKPHOS 66 01/02/2021   AST 24 01/02/2021   ALT 27 01/02/2021   PROT 7.6 01/02/2021   ALBUMIN 4.6 01/02/2021   CALCIUM 9.1 01/02/2021   GFR 104.28 01/02/2021   Lab Results  Component Value Date   CHOL 163 01/02/2021   Lab Results  Component Value Date   HDL 32.80 (L) 01/02/2021   Lab Results  Component Value Date   LDLCALC 96 06/27/2020   Lab Results  Component Value Date   TRIG 215.0 (H) 01/02/2021   Lab Results  Component Value Date   CHOLHDL 5 01/02/2021   Lab Results  Component Value Date   HGBA1C 6.0 01/02/2021       Assessment & Plan:   Problem List Items Addressed This Visit     Hypertension    Well controlled, no changes to meds. Encouraged heart healthy diet such as the DASH diet and exercise as tolerated.        Relevant Orders   CBC (Completed)   Comprehensive metabolic panel (Completed)   TSH (Completed)   Hyperlipidemia with target LDL less than 100    Encourage heart healthy diet such as MIND or DASH diet, increase exercise, avoid trans fats, simple carbohydrates and processed foods, consider a krill or fish or flaxseed oil cap daily. Tolerating Rosuvastatin       Relevant Orders   Lipid panel (Completed)   Obesity    Encouraged DASH or MIND diet, decrease po intake and increase exercise as tolerated. Needs 7-8 hours of sleep nightly. Avoid trans fats, eat small, frequent meals every 4-5 hours with lean proteins, complex carbs and healthy fats. Minimize simple carbs, high fat foods and processed foods       Hyperglycemia    hgba1c acceptable, minimize simple carbs. Increase exercise as tolerated.        Relevant Orders   Hemoglobin A1c (Completed)   Right hip pain    Encouraged moist heat and gentle stretching as  tolerated. May try NSAIDs and prescription meds as directed and report if symptoms Worsen or seek immediate care. Gets relief from Tramadol prn may continue       Grief    His mother died in 10-22-22 and he is trying to clean up her estate presently. He feels he is managing well enough at this time and will let us know if his grief overwhelms him       Other Visit Diagnoses     High risk medication use    -  Primary   Relevant Orders   DRUG MONITORING, PANEL 8 WITH CONFIRMATION, URINE       I am having Lovette Cliche maintain his aspirin EC, Zinc, Ascorbic Acid (VITAMIN C PO), diclofenac, EPINEPHrine, amLODipine, rosuvastatin, potassium chloride, and traMADol.  No orders of the defined types were placed in this encounter.    Danise Edge, MD

## 2021-01-02 NOTE — Assessment & Plan Note (Signed)
Encourage heart healthy diet such as MIND or DASH diet, increase exercise, avoid trans fats, simple carbohydrates and processed foods, consider a krill or fish or flaxseed oil cap daily.  Tolerating Rosuvastatin 

## 2021-01-02 NOTE — Patient Instructions (Addendum)
Let us know when  you are ready for your colonoscopy  Call for consideration of treatment if you ever test positive for COVID   Hypertension, Adult Hypertension is another name for high blood pressure. High blood pressure forces your heart to work harder to pump blood. This can cause problems overtime. There are two numbers in a blood pressure reading. There is a top number (systolic) over a bottom number (diastolic). It is best to have a blood pressure that is below 120/80. Healthy choicescan help lower your blood pressure, or you may need medicine to help lower it. What are the causes? The cause of this condition is not known. Some conditions may be related tohigh blood pressure. What increases the risk? Smoking. Having type 2 diabetes mellitus, high cholesterol, or both. Not getting enough exercise or physical activity. Being overweight. Having too much fat, sugar, calories, or salt (sodium) in your diet. Drinking too much alcohol. Having long-term (chronic) kidney disease. Having a family history of high blood pressure. Age. Risk increases with age. Race. You may be at higher risk if you are African American. Gender. Men are at higher risk than women before age 56. After age 4, women are at higher risk than men. Having obstructive sleep apnea. Stress. What are the signs or symptoms? High blood pressure may not cause symptoms. Very high blood pressure (hypertensive crisis) may cause: Headache. Feelings of worry or nervousness (anxiety). Shortness of breath. Nosebleed. A feeling of being sick to your stomach (nausea). Throwing up (vomiting). Changes in how you see. Very bad chest pain. Seizures. How is this treated? This condition is treated by making healthy lifestyle changes, such as: Eating healthy foods. Exercising more. Drinking less alcohol. Your health care provider may prescribe medicine if lifestyle changes are not enough to get your blood pressure under control,  and if: Your top number is above 130. Your bottom number is above 80. Your personal target blood pressure may vary. Follow these instructions at home: Eating and drinking  If told, follow the DASH eating plan. To follow this plan: Fill one half of your plate at each meal with fruits and vegetables. Fill one fourth of your plate at each meal with whole grains. Whole grains include whole-wheat pasta, brown rice, and whole-grain bread. Eat or drink low-fat dairy products, such as skim milk or low-fat yogurt. Fill one fourth of your plate at each meal with low-fat (lean) proteins. Low-fat proteins include fish, chicken without skin, eggs, beans, and tofu. Avoid fatty meat, cured and processed meat, or chicken with skin. Avoid pre-made or processed food. Eat less than 1,500 mg of salt each day. Do not drink alcohol if: Your doctor tells you not to drink. You are pregnant, may be pregnant, or are planning to become pregnant. If you drink alcohol: Limit how much you use to: 0-1 drink a day for women. 0-2 drinks a day for men. Be aware of how much alcohol is in your drink. In the [place] one drink equals one 12 oz bottle of beer (355 mL), one 5 oz glass of wine (148 mL), or one 1 oz glass of hard liquor (44 mL).  Lifestyle  Work with your doctor to stay at a healthy weight or to lose weight. Ask your doctor what the best weight is for you. Get at least 30 minutes of exercise most days of the week. This may include walking, swimming, or biking. Get at least 30 minutes of exercise that strengthens your muscles (resistance exercise) at  least 3 days a week. This may include lifting weights or doing Pilates. Do not use any products that contain nicotine or tobacco, such as cigarettes, e-cigarettes, and chewing tobacco. If you need help quitting, ask your doctor. Check your blood pressure at home as told by your doctor. Keep all follow-up visits as told by your doctor. This is  important.  Medicines Take over-the-counter and prescription medicines only as told by your doctor. Follow directions carefully. Do not skip doses of blood pressure medicine. The medicine does not work as well if you skip doses. Skipping doses also puts you at risk for problems. Ask your doctor about side effects or reactions to medicines that you should watch for. Contact a doctor if you: Think you are having a reaction to the medicine you are taking. Have headaches that keep coming back (recurring). Feel dizzy. Have swelling in your ankles. Have trouble with your vision. Get help right away if you: Get a very bad headache. Start to feel mixed up (confused). Feel weak or numb. Feel faint. Have very bad pain in your: Chest. Belly (abdomen). Throw up more than once. Have trouble breathing. Summary Hypertension is another name for high blood pressure. High blood pressure forces your heart to work harder to pump blood. For most people, a normal blood pressure is less than 120/80. Making healthy choices can help lower blood pressure. If your blood pressure does not get lower with healthy choices, you may need to take medicine. This information is not intended to replace advice given to you by your health care provider. Make sure you discuss any questions you have with your healthcare provider. Document Revised: 03/12/2018 Document Reviewed: 03/12/2018 Elsevier Patient Education  2022 [employer].

## 2021-01-02 NOTE — Assessment & Plan Note (Signed)
hgba1c acceptable, minimize simple carbs. Increase exercise as tolerated.  

## 2021-01-02 NOTE — Assessment & Plan Note (Signed)
Encouraged DASH or MIND diet, decrease po intake and increase exercise as tolerated. Needs 7-8 hours of sleep nightly. Avoid trans fats, eat small, frequent meals every 4-5 hours with lean proteins, complex carbs and healthy fats. Minimize simple carbs, high fat foods and processed foods 

## 2021-01-02 NOTE — Assessment & Plan Note (Signed)
Well controlled, no changes to meds. Encouraged heart healthy diet such as the DASH diet and exercise as tolerated.  °

## 2021-04-06 ENCOUNTER — Other Ambulatory Visit: Payer: Self-pay | Admitting: Family Medicine

## 2021-04-06 DIAGNOSIS — I10 Essential (primary) hypertension: Secondary | ICD-10-CM

## 2021-04-06 DIAGNOSIS — M545 Low back pain, unspecified: Secondary | ICD-10-CM

## 2021-04-07 MED ORDER — TRAMADOL HCL 50 MG PO TABS: 50.0000 mg | ORAL_TABLET | Freq: Two times a day (BID) | ORAL | 0 refills | Status: DC | PRN

## 2021-04-07 NOTE — Telephone Encounter (Signed)
Requesting: tramadol 50mg   Contract: 01/02/2021 UDS: collected on 01/02/2021- no results? Last Visit: 01/02/2021 Next Visit: 08/14/2021 Last Refill: 11/28/2020 #120 and 0RF  Please Advise

## 2021-08-14 ENCOUNTER — Encounter: Payer: Self-pay | Admitting: Family Medicine

## 2021-08-14 ENCOUNTER — Other Ambulatory Visit (HOSPITAL_BASED_OUTPATIENT_CLINIC_OR_DEPARTMENT_OTHER): Payer: Self-pay

## 2021-08-14 ENCOUNTER — Ambulatory Visit (INDEPENDENT_AMBULATORY_CARE_PROVIDER_SITE_OTHER): Payer: BC Managed Care – PPO | Admitting: Family Medicine

## 2021-08-14 VITALS — BP 112/64 | HR 83 | Temp 98.2°F | Resp 16 | Ht 65.0 in | Wt 225.8 lb

## 2021-08-14 DIAGNOSIS — Z0000 Encounter for general adult medical examination without abnormal findings: Secondary | ICD-10-CM

## 2021-08-14 DIAGNOSIS — I10 Essential (primary) hypertension: Secondary | ICD-10-CM

## 2021-08-14 DIAGNOSIS — F4321 Adjustment disorder with depressed mood: Secondary | ICD-10-CM

## 2021-08-14 DIAGNOSIS — W548XXA Other contact with dog, initial encounter: Secondary | ICD-10-CM | POA: Insufficient documentation

## 2021-08-14 DIAGNOSIS — M545 Low back pain, unspecified: Secondary | ICD-10-CM | POA: Diagnosis not present

## 2021-08-14 DIAGNOSIS — R739 Hyperglycemia, unspecified: Secondary | ICD-10-CM | POA: Diagnosis not present

## 2021-08-14 DIAGNOSIS — F1190 Opioid use, unspecified, uncomplicated: Secondary | ICD-10-CM | POA: Diagnosis not present

## 2021-08-14 DIAGNOSIS — Z79899 Other long term (current) drug therapy: Secondary | ICD-10-CM

## 2021-08-14 DIAGNOSIS — Z1211 Encounter for screening for malignant neoplasm of colon: Secondary | ICD-10-CM

## 2021-08-14 DIAGNOSIS — G8929 Other chronic pain: Secondary | ICD-10-CM

## 2021-08-14 DIAGNOSIS — E785 Hyperlipidemia, unspecified: Secondary | ICD-10-CM

## 2021-08-14 DIAGNOSIS — Z23 Encounter for immunization: Secondary | ICD-10-CM | POA: Diagnosis not present

## 2021-08-14 DIAGNOSIS — M549 Dorsalgia, unspecified: Secondary | ICD-10-CM | POA: Insufficient documentation

## 2021-08-14 LAB — CBC WITH DIFFERENTIAL/PLATELET
Basophils Absolute: 0.1 10*3/uL (ref 0.0–0.1)
Basophils Relative: 1.5 % (ref 0.0–3.0)
Eosinophils Absolute: 0.9 10*3/uL — ABNORMAL HIGH (ref 0.0–0.7)
Eosinophils Relative: 10.9 % — ABNORMAL HIGH (ref 0.0–5.0)
HCT: 41 % (ref 39.0–52.0)
Hemoglobin: 13.8 g/dL (ref 13.0–17.0)
Lymphocytes Relative: 28.3 % (ref 12.0–46.0)
Lymphs Abs: 2.3 10*3/uL (ref 0.7–4.0)
MCHC: 33.7 g/dL (ref 30.0–36.0)
MCV: 83.6 fl (ref 78.0–100.0)
Monocytes Absolute: 0.7 10*3/uL (ref 0.1–1.0)
Monocytes Relative: 8.4 % (ref 3.0–12.0)
Neutro Abs: 4.1 10*3/uL (ref 1.4–7.7)
Neutrophils Relative %: 50.9 % (ref 43.0–77.0)
Platelets: 308 10*3/uL (ref 150.0–400.0)
RBC: 4.91 Mil/uL (ref 4.22–5.81)
RDW: 13.4 % (ref 11.5–15.5)
WBC: 8 10*3/uL (ref 4.0–10.5)

## 2021-08-14 LAB — COMPREHENSIVE METABOLIC PANEL
ALT: 23 U/L (ref 0–53)
AST: 22 U/L (ref 0–37)
Albumin: 4.5 g/dL (ref 3.5–5.2)
Alkaline Phosphatase: 69 U/L (ref 39–117)
BUN: 14 mg/dL (ref 6–23)
CO2: 30 mEq/L (ref 19–32)
Calcium: 9.6 mg/dL (ref 8.4–10.5)
Chloride: 103 mEq/L (ref 96–112)
Creatinine, Ser: 1.03 mg/dL (ref 0.40–1.50)
GFR: 85.86 mL/min (ref >60.00)
Glucose, Bld: 122 mg/dL — ABNORMAL HIGH (ref 70–99)
Potassium: 3.8 mEq/L (ref 3.5–5.1)
Sodium: 142 mEq/L (ref 135–145)
Total Bilirubin: 0.4 mg/dL (ref 0.2–1.2)
Total Protein: 7.4 g/dL (ref 6.0–8.3)

## 2021-08-14 LAB — LDL CHOLESTEROL, DIRECT: Direct LDL: 104 mg/dL

## 2021-08-14 LAB — TSH: TSH: 3.64 u[IU]/mL (ref 0.35–5.50)

## 2021-08-14 LAB — LIPID PANEL
Cholesterol: 162 mg/dL (ref 0–200)
HDL: 32.5 mg/dL — ABNORMAL LOW (ref >39.00)
NonHDL: 129.31
Total CHOL/HDL Ratio: 5
Triglycerides: 247 mg/dL — ABNORMAL HIGH (ref 0.0–149.0)
VLDL: 49.4 mg/dL — ABNORMAL HIGH (ref 0.0–40.0)

## 2021-08-14 LAB — HEMOGLOBIN A1C: Hgb A1c MFr Bld: 5.8 % (ref 4.6–6.5)

## 2021-08-14 MED ORDER — TRAMADOL HCL 50 MG PO TABS
50.0000 mg | ORAL_TABLET | Freq: Two times a day (BID) | ORAL | 0 refills | Status: DC | PRN
  Filled 2021-08-14: qty 28, 7d supply, fill #0
  Filled 2021-08-21: qty 92, 23d supply, fill #1

## 2021-08-14 NOTE — Assessment & Plan Note (Signed)
Encouraged moist heat and gentle stretching as tolerated. May try NSAIDs and prescription meds as directed and report if symptoms worsen or seek immediate care. Refill given on Tramadol and UDS updated.

## 2021-08-14 NOTE — Assessment & Plan Note (Signed)
Mother died last spring. He is managing is grief well most days. He will let us know if it changes.

## 2021-08-14 NOTE — Assessment & Plan Note (Signed)
hgba1c acceptable, minimize simple carbs. Increase exercise as tolerated.  

## 2021-08-14 NOTE — Assessment & Plan Note (Signed)
Well controlled, no changes to meds. Encouraged heart healthy diet such as the DASH diet and exercise as tolerated.  °

## 2021-08-14 NOTE — Assessment & Plan Note (Signed)
Tolerating statin, encouraged heart healthy diet, avoid trans fats, minimize simple carbs and saturated fats. Increase exercise as tolerated 

## 2021-08-14 NOTE — Assessment & Plan Note (Signed)
Two days ago suffered a small scratch on his right hand. No bite. He cleaned it well is not worsening and has not been painful, red or swollen. Given a Tdap and he will monitor for any change and let us know.

## 2021-08-14 NOTE — Assessment & Plan Note (Addendum)
Patient encouraged to maintain heart healthy diet, regular exercise, adequate sleep. Consider daily probiotics. Take medications as prescribed. Labs ordered and reviewed. Agrees to referral for screening colonoscopy he has a week off in July and he is hoping he can get it done then. Given Tdap today. He got flu shot at work, he is asked to try and get [place] a copy of his flu shot for his chart.

## 2021-08-14 NOTE — Patient Instructions (Addendum)
60-80 ounces of fluids daily 6-8 hours of sleep a night  Give [place] a copy of flu shot if possible  Covid shot in 2 weeks   Preventive Care 65-49 Years Old, Male Preventive care refers to lifestyle choices and visits with your health care provider that can promote health and wellness. Preventive care visits are also called wellness exams. What can I expect for my preventive care visit? Counseling During your preventive care visit, your health care provider may ask about your: Medical history, including: Past medical problems. Family medical history. Current health, including: Emotional well-being. Home life and relationship well-being. Sexual activity. Lifestyle, including: Alcohol, nicotine or tobacco, and drug use. Access to firearms. Diet, exercise, and sleep habits. Safety issues such as seatbelt and bike helmet use. Sunscreen use. Work and work Astronomer. Physical exam Your health care provider will check your: Height and weight. These may be used to calculate your BMI (body mass index). BMI is a measurement that tells if you are at a healthy weight. Waist circumference. This measures the distance around your waistline. This measurement also tells if you are at a healthy weight and may help predict your risk of certain diseases, such as type 2 diabetes and high blood pressure. Heart rate and blood pressure. Body temperature. Skin for abnormal spots. What immunizations do I need? Vaccines are usually given at various ages, according to a schedule. Your health care provider will recommend vaccines for you based on your age, medical history, and lifestyle or other factors, such as travel or where you work. What tests do I need? Screening Your health care provider may recommend screening tests for certain conditions. This may include: Lipid and cholesterol levels. Diabetes screening. This is done by checking your blood sugar (glucose) after you have not eaten for a while  (fasting). Hepatitis B test. Hepatitis C test. HIV (human immunodeficiency virus) test. STI (sexually transmitted infection) testing, if you are at risk. Lung cancer screening. Prostate cancer screening. Colorectal cancer screening. Talk with your health care provider about your test results, treatment options, and if necessary, the need for more tests. Follow these instructions at home: Eating and drinking  Eat a diet that includes fresh fruits and vegetables, whole grains, lean protein, and low-fat dairy products. Take vitamin and mineral supplements as recommended by your health care provider. Do not drink alcohol if your health care provider tells you not to drink. If you drink alcohol: Limit how much you have to 0-2 drinks a day. Know how much alcohol is in your drink. In the [place] one drink equals one 12 oz bottle of beer (355 mL), one 5 oz glass of wine (148 mL), or one 1 oz glass of hard liquor (44 mL). Lifestyle Brush your teeth every morning and night with fluoride toothpaste. Floss one time each day. Exercise for at least 30 minutes 5 or more days each week. Do not use any products that contain nicotine or tobacco. These products include cigarettes, chewing tobacco, and vaping devices, such as e-cigarettes. If you need help quitting, ask your health care provider. Do not use drugs. If you are sexually active, practice safe sex. Use a condom or other form of protection to prevent STIs. Take aspirin only as told by your health care provider. Make sure that you understand how much to take and what form to take. Work with your health care provider to find out whether it is safe and beneficial for you to take aspirin daily. Find healthy ways to manage  stress, such as: Meditation, yoga, or listening to music. Journaling. Talking to a trusted person. Spending time with friends and family. Minimize exposure to UV radiation to reduce your risk of skin cancer. Safety Always wear  your seat belt while driving or riding in a vehicle. Do not drive: If you have been drinking alcohol. Do not ride with someone who has been drinking. When you are tired or distracted. While texting. If you have been using any mind-altering substances or drugs. Wear a helmet and other protective equipment during sports activities. If you have firearms in your house, make sure you follow all gun safety procedures. What's next? Go to your health care provider once a year for an annual wellness visit. Ask your health care provider how often you should have your eyes and teeth checked. Stay up to date on all vaccines. This information is not intended to replace advice given to you by your health care provider. Make sure you discuss any questions you have with your health care provider. Document Revised: 12/28/2020 Document Reviewed: 12/28/2020 Elsevier Patient Education  2022 [employer].

## 2021-08-14 NOTE — Progress Notes (Signed)
Subjective:   By signing my name below, I, Zite Okoli, attest that this documentation has been prepared under the direction and in the presence of  Bradd Canary, MD. 08/14/2021     Patient ID: Jason Harper, male    DOB: 1971/01/11, 51 y.o.   MRN: [id number]  Chief Complaint  Patient presents with   Annual Exam    HPI Patient is in today for a comprehensive physical exam. No recent illnesses or ED visits.  He was recently scratched on his right hand by his dog two days ago. Denies any symptoms.   He will schedule a colonoscopy for July 2023.  He is requesting for a refill on 50 mg tramadol.  UTD on dental care.  He denies fever, congestion, eye pain, chest pain, palpitations, leg swelling, shortness of breath, nausea, abdominal pain, diarrhea and blood in stool. Also denies dysuria, frequency, back pain and headaches.  He only eats one meal a day. He is trying to increase his protein intake.    He has 3 Covid-19 vaccines at this time. He will receive the tetanus vaccine today and receive the Covid-19 booster vaccine in a couple weeks. He received his flu shot at work.   No changes in family medical history.   Past Medical History:  Diagnosis Date   Gout 04/29/2017   Headache 11/12/2016   Hearing loss in right ear 03/22/2015   Secondary to Scarlet Fever as child   High cholesterol    Hypertension    Obesity 03/22/2015   Preventative health care 11/12/2016    No past surgical history on file.  Family History  Problem Relation Age of Onset   Heart disease Mother    Hyperlipidemia Mother    Hypertension Mother    Arthritis Mother    Hypertension Father    Hyperlipidemia Father    Heart disease Father        MI   Hypertension Brother    Heart disease Brother        MI    Social History   Socioeconomic History   Marital status: Legally Separated    Spouse name: Not on file   Number of children: Not on file   Years of education: Not on file   Highest  education level: Not on file  Occupational History   Not on file  Tobacco Use   Smoking status: Never   Smokeless tobacco: Never  Vaping Use   Vaping Use: Never used  Substance and Sexual Activity   Alcohol use: Yes    Alcohol/week: 1.0 standard drink    Types: 1 Cans of beer per week   Drug use: No   Sexual activity: Not on file    Comment: lives alon, no dietary restrictions, 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     aspirin EC 81 MG tablet Take 81 mg  by mouth daily.     diclofenac (VOLTAREN) 75 MG EC tablet Take 1 tablet (75 mg total) by mouth 2 (two) times daily as needed. 90 tablet 1   EPINEPHrine 0.3 mg/0.3 mL IJ SOAJ injection INJECT CONTENTS OF 1 PEN AS NEEDED FOR ALLERGIC REACTION 1 each 1   rosuvastatin (CRESTOR) 20 MG tablet TAKE ONE TABLET BY MOUTH DAILY 90 tablet 1   Zinc 50 MG TABS Take 1 tablet by mouth daily.     traMADol (ULTRAM) 50 MG tablet Take 1-2 tablets (50-100 mg total) by mouth every 12 (twelve) hours as needed for moderate pain or severe pain. Dz:M54.5 Back Pain 120 tablet 0   potassium chloride (KLOR-CON) 10 MEQ tablet TAKE 1 TABLET BY MOUTH ONCE DAILY 3 tablet 0   No facility-administered medications prior to visit.    Allergies  Allergen Reactions   Penicillins Swelling    Review of Systems  Constitutional:  Negative for chills and fever.  HENT:  Negative for congestion and sore throat.   Eyes:   Negative for pain.  Respiratory:  Negative for cough and shortness of breath.   Cardiovascular:  Negative for chest pain, palpitations and leg swelling.  Gastrointestinal:  Negative for abdominal pain, blood in stool, diarrhea and nausea.  Genitourinary:  Negative for dysuria and frequency.  Musculoskeletal:  Negative for back pain.  Neurological:  Negative for headaches.  Psychiatric/Behavioral:  The patient is not nervous/anxious and does not have insomnia.       Objective:    Physical Exam Constitutional:      Appearance: Normal appearance. He is not ill-appearing.  HENT:     Head: Normocephalic and atraumatic.     Right Ear: Tympanic membrane, ear canal and external ear normal.     Left Ear: Tympanic membrane, ear canal and external ear normal.  Eyes:     Extraocular Movements:     Right eye: No nystagmus.     Left eye: No nystagmus.     Conjunctiva/sclera: Conjunctivae normal.  Cardiovascular:     Rate and Rhythm: Normal rate and regular rhythm.     Heart sounds: Normal heart sounds. No murmur heard. Pulmonary:     Breath sounds: Normal breath sounds. No wheezing.  Abdominal:     General: Bowel sounds are normal. There is no distension.     Palpations: Abdomen is soft.     Tenderness: There is no abdominal tenderness.     Hernia: No hernia is present.  Musculoskeletal:     Cervical back: Neck supple.     Comments: 5/5 strength in upper and lower extremities   Lymphadenopathy:     Cervical: No cervical adenopathy.  Skin:    General: Skin is warm and dry.     Comments: Dog scratch on right hand  Neurological:     Mental Status: He is alert and oriented to person, place, and time.     Deep Tendon Reflexes:     Reflex Scores:      Patellar reflexes are 2+ on the right side and 2+ on the left side. Psychiatric:        Behavior: Behavior normal.    BP 112/64    Pulse 83    Temp 98.2 F (36.8 C)    Resp 16    Ht 5\' 5"  (1.651 m)    Wt 225 lb 12.8 oz (102.4 kg)    SpO2  95%    BMI 37.58 kg/m  Wt Readings from Last 3  Encounters:  08/14/21 225 lb 12.8 oz (102.4 kg)  01/02/21 227 lb 3.2 oz (103.1 kg)  06/27/20 225 lb (102.1 kg)    Diabetic Foot Exam - Simple   No data filed    Lab Results  Component Value Date   WBC 9.0 01/02/2021   HGB 15.0 01/02/2021   HCT 44.1 01/02/2021   PLT 342.0 01/02/2021   GLUCOSE 107 (H) 01/02/2021   CHOL 163 01/02/2021   TRIG 215.0 (H) 01/02/2021   HDL 32.80 (L) 01/02/2021   LDLDIRECT 106.0 01/02/2021   LDLCALC 96 06/27/2020   ALT 27 01/02/2021   AST 24 01/02/2021   NA 139 01/02/2021   K 3.7 01/02/2021   CL 100 01/02/2021   CREATININE 0.82 01/02/2021   BUN 11 01/02/2021   CO2 30 01/02/2021   TSH 2.92 01/02/2021   PSA 0.9 07/26/2014   HGBA1C 6.0 01/02/2021    Lab Results  Component Value Date   TSH 2.92 01/02/2021   Lab Results  Component Value Date   WBC 9.0 01/02/2021   HGB 15.0 01/02/2021   HCT 44.1 01/02/2021   MCV 82.6 01/02/2021   PLT 342.0 01/02/2021   Lab Results  Component Value Date   NA 139 01/02/2021   K 3.7 01/02/2021   CO2 30 01/02/2021   GLUCOSE 107 (H) 01/02/2021   BUN 11 01/02/2021   CREATININE 0.82 01/02/2021   BILITOT 0.5 01/02/2021   ALKPHOS 66 01/02/2021   AST 24 01/02/2021   ALT 27 01/02/2021   PROT 7.6 01/02/2021   ALBUMIN 4.6 01/02/2021   CALCIUM 9.1 01/02/2021   GFR 104.28 01/02/2021   Lab Results  Component Value Date   CHOL 163 01/02/2021   Lab Results  Component Value Date   HDL 32.80 (L) 01/02/2021   Lab Results  Component Value Date   LDLCALC 96 06/27/2020   Lab Results  Component Value Date   TRIG 215.0 (H) 01/02/2021   Lab Results  Component Value Date   CHOLHDL 5 01/02/2021   Lab Results  Component Value Date   HGBA1C 6.0 01/02/2021       Assessment & Plan:   Problem List Items Addressed This Visit     Hypertension    Well controlled, no changes to meds. Encouraged heart healthy diet such as the DASH diet and exercise as  tolerated.       Relevant Orders   CBC with Differential/Platelet   Comprehensive metabolic panel   Lipid panel   TSH   Hyperlipidemia with target LDL less than 100    Tolerating statin, encouraged heart healthy diet, avoid trans fats, minimize simple carbs and saturated fats. Increase exercise as tolerated      Relevant Orders   CBC with Differential/Platelet   Comprehensive metabolic panel   Lipid panel   TSH   Hyperglycemia    hgba1c acceptable, minimize simple carbs. Increase exercise as tolerated.       Relevant Orders   Hemoglobin A1c   Preventative health care - Primary    Patient encouraged to maintain heart healthy diet, regular exercise, adequate sleep. Consider daily probiotics. Take medications as prescribed. Labs ordered and reviewed. Agrees to referral for screening colonoscopy he has a week off in July and he is hoping he can get it done then. Given Tdap today. He got flu shot at work, he is asked to try and get us a copy of his flu shot for his chart.       Grief  Mother died last spring. He is managing is grief well most days. He will let us know if it changes.      Dog scratch    Two days ago suffered a small scratch on his right hand. No bite. He cleaned it well is not worsening and has not been painful, red or swollen. Given a Tdap and he will monitor for any change and let us know.       Back pain    Encouraged moist heat and gentle stretching as tolerated. May try NSAIDs and prescription meds as directed and report if symptoms worsen or seek immediate care. Refill given on Tramadol and UDS updated.       Relevant Medications   traMADol (ULTRAM) 50 MG tablet   Other Visit Diagnoses     High risk medication use       Relevant Orders   Drug Monitoring Panel [phone] , Urine   Chronic, continuous use of opioids       Relevant Orders   Drug Monitoring Panel [phone] , Urine   Colon cancer screening       Relevant Orders   Ambulatory referral to  Gastroenterology   Low back pain       Relevant Medications   traMADol (ULTRAM) 50 MG tablet   Need for Tdap vaccination       Relevant Orders   Tdap vaccine greater than or equal to 7yo IM (Completed)        Meds ordered this encounter  Medications   traMADol (ULTRAM) 50 MG tablet    Sig: Take 1-2 tablets (50-100 mg total) by mouth every 12 (twelve) hours as needed for moderate pain or severe pain.    Dispense:  120 tablet    Refill:  0    I,Zite Okoli,acting as a scribe for Danise Edge, MD.,have documented all relevant documentation on the behalf of Danise Edge, MD,as directed by  Danise Edge, MD while in the presence of Danise Edge, MD.   I, Bradd Canary, MD., personally preformed the services described in this documentation.  All medical record entries made by the scribe were at my direction and in my presence.  I have reviewed the chart and discharge instructions (if applicable) and agree that the record reflects my personal performance and is accurate and complete. 08/14/2021

## 2021-08-15 LAB — DRUG MONITORING PANEL 376104, URINE
Amphetamines: NEGATIVE ng/mL (ref <500)
Barbiturates: NEGATIVE ng/mL (ref <300)
Benzodiazepines: NEGATIVE ng/mL (ref <100)
Cocaine Metabolite: NEGATIVE ng/mL (ref <150)
Desmethyltramadol: NEGATIVE ng/mL (ref <100)
Opiates: NEGATIVE ng/mL (ref <100)
Oxycodone: NEGATIVE ng/mL (ref <100)
Tramadol: NEGATIVE ng/mL (ref <100)

## 2021-08-15 LAB — DM TEMPLATE

## 2021-08-21 ENCOUNTER — Other Ambulatory Visit (HOSPITAL_BASED_OUTPATIENT_CLINIC_OR_DEPARTMENT_OTHER): Payer: Self-pay

## 2021-09-23 ENCOUNTER — Other Ambulatory Visit: Payer: Self-pay | Admitting: Family Medicine

## 2021-09-23 DIAGNOSIS — I10 Essential (primary) hypertension: Secondary | ICD-10-CM

## 2021-10-01 ENCOUNTER — Other Ambulatory Visit: Payer: Self-pay | Admitting: Family Medicine

## 2021-12-14 ENCOUNTER — Other Ambulatory Visit: Payer: Self-pay | Admitting: Family Medicine

## 2021-12-14 DIAGNOSIS — M545 Low back pain, unspecified: Secondary | ICD-10-CM

## 2021-12-15 NOTE — Telephone Encounter (Signed)
Requesting:tramadol 50 mg Contract:01/02/21 UDS:08/14/20 Last Visit:08/14/21 Next Visit:01/29/22 Last Refill:08/14/21  Please Advise

## 2022-01-15 ENCOUNTER — Ambulatory Visit: Payer: BC Managed Care – PPO | Admitting: Family Medicine

## 2022-01-26 NOTE — Progress Notes (Unsigned)
Subjective:    Patient ID: Jason Harper, male    DOB: 10-13-1970, 51 y.o.   MRN: [id number]  No chief complaint on file.   HPI Patient is in today for a follow up.  Past Medical History:  Diagnosis Date   Gout 04/29/2017   Headache 11/12/2016   Hearing loss in right ear 03/22/2015   Secondary to Scarlet Fever as child   High cholesterol    Hypertension    Obesity 03/22/2015   Preventative health care 11/12/2016    No past surgical history on file.  Family History  Problem Relation Age of Onset   Heart disease Mother    Hyperlipidemia Mother    Hypertension Mother    Arthritis Mother    Hypertension Father    Hyperlipidemia Father    Heart disease Father        MI   Hypertension Brother    Heart disease Brother        MI    Social History   Socioeconomic History   Marital status: Legally Separated    Spouse name: Not on file   Number of children: Not on file   Years of education: Not on file   Highest education level: Not on file  Occupational History   Not on file  Tobacco Use   Smoking status: Never   Smokeless tobacco: Never  Vaping Use   Vaping Use: Never used  Substance and Sexual Activity   Alcohol use: Yes    Alcohol/week: 1.0 standard drink of alcohol    Types: 1 Cans of beer per week   Drug use: No   Sexual activity: Not on file    Comment: lives alon, no dietary restrictions, 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     aspirin EC 81 MG tablet Take 81 mg by mouth daily.     diclofenac (VOLTAREN) 75 MG EC tablet Take 1 tablet (75 mg total) by mouth 2 (two) times daily as needed. 90 tablet 1   EPINEPHrine 0.3 mg/0.3 mL IJ SOAJ injection INJECT CONTENTS OF 1 PEN AS NEEDED FOR ALLERGIC REACTION 1 each 1   potassium chloride (KLOR-CON) 10 MEQ tablet TAKE 1 TABLET BY MOUTH ONCE DAILY 3 tablet 0   rosuvastatin (CRESTOR) 20 MG tablet TAKE ONE TABLET BY MOUTH DAILY 90 tablet 1   traMADol (ULTRAM) 50 MG tablet TAKE 1-2 TABLETS BY MOUTH EVERY 12 HOURS AS NEEDED FOR MODERATE OR SEVERE PAIN 120 tablet 0   Zinc 50 MG TABS Take 1 tablet by mouth daily.     No facility-administered medications prior to visit.    Allergies  Allergen Reactions   Penicillins Swelling    ROS     Objective:    Physical Exam  There were no vitals taken for this visit. Wt Readings from Last 3 Encounters:  08/14/21 225 lb 12.8  oz (102.4 kg)  01/02/21 227 lb 3.2 oz (103.1 kg)  06/27/20 225 lb (102.1 kg)    Diabetic Foot Exam - Simple   No data filed    Lab Results  Component Value Date   WBC 8.0 08/14/2021   HGB 13.8 08/14/2021   HCT 41.0 08/14/2021   PLT 308.0 08/14/2021   GLUCOSE 122 (H) 08/14/2021   CHOL 162 08/14/2021   TRIG 247.0 (H) 08/14/2021   HDL 32.50 (L) 08/14/2021   LDLDIRECT 104.0 08/14/2021   LDLCALC 96 06/27/2020   ALT 23 08/14/2021   AST 22 08/14/2021   NA 142 08/14/2021   K 3.8 08/14/2021   CL 103 08/14/2021   CREATININE 1.03 08/14/2021   BUN 14 08/14/2021   CO2 30 08/14/2021   TSH 3.64 08/14/2021   PSA 0.9 07/26/2014   HGBA1C 5.8 08/14/2021    Lab Results  Component Value Date   TSH 3.64 08/14/2021   Lab Results  Component Value Date   WBC 8.0 08/14/2021   HGB 13.8 08/14/2021   HCT 41.0 08/14/2021   MCV 83.6 08/14/2021   PLT 308.0 08/14/2021   Lab Results  Component Value Date   NA 142 08/14/2021   K 3.8 08/14/2021   CO2 30 08/14/2021   GLUCOSE 122 (H)  08/14/2021   BUN 14 08/14/2021   CREATININE 1.03 08/14/2021   BILITOT 0.4 08/14/2021   ALKPHOS 69 08/14/2021   AST 22 08/14/2021   ALT 23 08/14/2021   PROT 7.4 08/14/2021   ALBUMIN 4.5 08/14/2021   CALCIUM 9.6 08/14/2021   GFR 85.86 08/14/2021   Lab Results  Component Value Date   CHOL 162 08/14/2021   Lab Results  Component Value Date   HDL 32.50 (L) 08/14/2021   Lab Results  Component Value Date   LDLCALC 96 06/27/2020   Lab Results  Component Value Date   TRIG 247.0 (H) 08/14/2021   Lab Results  Component Value Date   CHOLHDL 5 08/14/2021   Lab Results  Component Value Date   HGBA1C 5.8 08/14/2021       Assessment & Plan:      Problem List Items Addressed This Visit   None   I am having Jason Harper maintain his aspirin EC, Zinc, Ascorbic Acid (VITAMIN C PO), diclofenac, EPINEPHrine, potassium chloride, amLODipine, rosuvastatin, and traMADol.  No orders of the defined types were placed in this encounter.

## 2022-01-29 ENCOUNTER — Ambulatory Visit: Payer: BC Managed Care – PPO | Admitting: Family Medicine

## 2022-01-29 ENCOUNTER — Encounter: Payer: Self-pay | Admitting: Family Medicine

## 2022-01-29 VITALS — BP 130/84 | HR 58 | Resp 20 | Ht 65.0 in | Wt 215.4 lb

## 2022-01-29 DIAGNOSIS — M545 Low back pain, unspecified: Secondary | ICD-10-CM

## 2022-01-29 DIAGNOSIS — Z79899 Other long term (current) drug therapy: Secondary | ICD-10-CM | POA: Diagnosis not present

## 2022-01-29 DIAGNOSIS — E6609 Other obesity due to excess calories: Secondary | ICD-10-CM | POA: Diagnosis not present

## 2022-01-29 DIAGNOSIS — I10 Essential (primary) hypertension: Secondary | ICD-10-CM | POA: Diagnosis not present

## 2022-01-29 DIAGNOSIS — R739 Hyperglycemia, unspecified: Secondary | ICD-10-CM | POA: Diagnosis not present

## 2022-01-29 DIAGNOSIS — E785 Hyperlipidemia, unspecified: Secondary | ICD-10-CM | POA: Diagnosis not present

## 2022-01-29 LAB — CBC
HCT: 42.7 % (ref 39.0–52.0)
Hemoglobin: 14.4 g/dL (ref 13.0–17.0)
MCHC: 33.7 g/dL (ref 30.0–36.0)
MCV: 84.2 fl (ref 78.0–100.0)
Platelets: 301 10*3/uL (ref 150.0–400.0)
RBC: 5.06 Mil/uL (ref 4.22–5.81)
RDW: 14 % (ref 11.5–15.5)
WBC: 8.1 10*3/uL (ref 4.0–10.5)

## 2022-01-29 LAB — COMPREHENSIVE METABOLIC PANEL
ALT: 24 U/L (ref 0–53)
AST: 23 U/L (ref 0–37)
Albumin: 4.8 g/dL (ref 3.5–5.2)
Alkaline Phosphatase: 70 U/L (ref 39–117)
BUN: 11 mg/dL (ref 6–23)
CO2: 29 mEq/L (ref 19–32)
Calcium: 9.3 mg/dL (ref 8.4–10.5)
Chloride: 101 mEq/L (ref 96–112)
Creatinine, Ser: 0.91 mg/dL (ref 0.40–1.50)
GFR: 99.3 mL/min (ref >60.00)
Glucose, Bld: 94 mg/dL (ref 70–99)
Potassium: 4 mEq/L (ref 3.5–5.1)
Sodium: 140 mEq/L (ref 135–145)
Total Bilirubin: 0.6 mg/dL (ref 0.2–1.2)
Total Protein: 7.6 g/dL (ref 6.0–8.3)

## 2022-01-29 LAB — HEMOGLOBIN A1C: Hgb A1c MFr Bld: 5.7 % (ref 4.6–6.5)

## 2022-01-29 LAB — LIPID PANEL
Cholesterol: 137 mg/dL (ref 0–200)
HDL: 28.8 mg/dL — ABNORMAL LOW (ref >39.00)
LDL Cholesterol: 80 mg/dL (ref 0–99)
NonHDL: 108.07
Total CHOL/HDL Ratio: 5
Triglycerides: 142 mg/dL (ref 0.0–149.0)
VLDL: 28.4 mg/dL (ref 0.0–40.0)

## 2022-01-29 LAB — TSH: TSH: 3.2 u[IU]/mL (ref 0.35–5.50)

## 2022-01-29 MED ORDER — TIZANIDINE HCL 2 MG PO TABS: 1.0000 mg | ORAL_TABLET | Freq: Two times a day (BID) | ORAL | 0 refills | Status: DC | PRN

## 2022-01-29 MED ORDER — TRAMADOL HCL 50 MG PO TABS: ORAL_TABLET | ORAL | 0 refills | Status: DC

## 2022-01-29 MED ORDER — AMLODIPINE BESYLATE 10 MG PO TABS: 10.0000 mg | ORAL_TABLET | Freq: Every day | ORAL | 1 refills | Status: DC

## 2022-01-29 NOTE — Patient Instructions (Addendum)
Yer[place] Matte tea do not drink  Wegovy or Saxenda shots for weight loss  Acute Back Pain, Adult Acute back pain is sudden and usually short-lived. It is often caused by an injury to the muscles and tissues in the back. The injury may result from: A muscle, tendon, or ligament getting overstretched or torn. Ligaments are tissues that connect bones to each other. Lifting something improperly can cause a back strain. Wear and tear (degeneration) of the spinal disks. Spinal disks are circular tissue that provide cushioning between the bones of the spine (vertebrae). Twisting motions, such as while playing sports or doing yard work. A hit to the back. Arthritis. You may have a physical exam, lab tests, and imaging tests to find the cause of your pain. Acute back pain usually goes away with rest and home care. Follow these instructions at home: Managing pain, stiffness, and swelling Take over-the-counter and prescription medicines only as told by your health care provider. Treatment may include medicines for pain and inflammation that are taken by mouth or applied to the skin, or muscle relaxants. Your health care provider may recommend applying ice during the first 24-48 hours after your pain starts. To do this: Put ice in a plastic bag. Place a towel between your skin and the bag. Leave the ice on for 20 minutes, 2-3 times a day. Remove the ice if your skin turns bright red. This is very important. If you cannot feel pain, heat, or cold, you have a greater risk of damage to the area. If directed, apply heat to the affected area as often as told by your health care provider. Use the heat source that your health care provider recommends, such as a moist heat pack or a heating pad. Place a towel between your skin and the heat source. Leave the heat on for 20-30 minutes. Remove the heat if your skin turns bright red. This is especially important if you are unable to feel pain, heat, or cold. You have a  greater risk of getting burned. Activity  Do not stay in bed. Staying in bed for more than 1-2 days can delay your recovery. Sit up and stand up straight. Avoid leaning forward when you sit or hunching over when you stand. If you work at a desk, sit close to it so you do not need to lean over. Keep your chin tucked in. Keep your neck drawn back, and keep your elbows bent at a 90-degree angle (right angle). Sit high and close to the steering wheel when you drive. Add lower back (lumbar) support to your car seat, if needed. Take short walks on even surfaces as soon as you are able. Try to increase the length of time you walk each day. Do not sit, drive, or stand in one place for more than 30 minutes at a time. Sitting or standing for long periods of time can put stress on your back. Do not drive or use heavy machinery while taking prescription pain medicine. Use proper lifting techniques. When you bend and lift, use positions that put less stress on your back: [place] your knees. Keep the load close to your body. Avoid twisting. Exercise regularly as told by your health care provider. Exercising helps your back heal faster and helps prevent back injuries by keeping muscles strong and flexible. Work with a physical therapist to make a safe exercise program, as recommended by your health care provider. Do any exercises as told by your physical therapist. Lifestyle Maintain a healthy  weight. Extra weight puts stress on your back and makes it difficult to have good posture. Avoid activities or situations that make you feel anxious or stressed. Stress and anxiety increase muscle tension and can make back pain worse. Learn ways to manage anxiety and stress, such as through exercise. General instructions Sleep on a firm mattress in a comfortable position. Try lying on your side with your knees slightly bent. If you lie on your back, put a pillow under your knees. Keep your head and neck in a straight line  with your spine (neutral position) when using electronic equipment like smartphones or pads. To do this: Raise your smartphone or pad to look at it instead of bending your head or neck to look down. Put the smartphone or pad at the level of your face while looking at the screen. Follow your treatment plan as told by your health care provider. This may include: Cognitive or behavioral therapy. Acupuncture or massage therapy. Meditation or yoga. Contact a health care provider if: You have pain that is not relieved with rest or medicine. You have increasing pain going down into your legs or buttocks. Your pain does not improve after 2 weeks. You have pain at night. You lose weight without trying. You have a fever or chills. You develop nausea or vomiting. You develop abdominal pain. Get help right away if: You develop new bowel or bladder control problems. You have unusual weakness or numbness in your arms or legs. You feel faint. These symptoms may represent a serious problem that is an emergency. Do not wait to see if the symptoms will go away. Get medical help right away. Call your local emergency services (911 in the [place] Do not drive yourself to the hospital. Summary Acute back pain is sudden and usually short-lived. Use proper lifting techniques. When you bend and lift, use positions that put less stress on your back. Take over-the-counter and prescription medicines only as told by your health care provider, and apply heat or ice as told. This information is not intended to replace advice given to you by your health care provider. Make sure you discuss any questions you have with your health care provider. Document Revised: 09/23/2020 Document Reviewed: 09/23/2020 Elsevier Patient Education  2023 [employer].

## 2022-01-29 NOTE — Assessment & Plan Note (Signed)
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 Has been present since. Encouraged moist heat and gentle stretching as tolerated. May try NSAIDs and prescription meds as directed and report if symptoms worsen or seek immediate care. Will try a course of Tizanidine and if no improvement patient will let us know

## 2022-01-29 NOTE — Assessment & Plan Note (Signed)
Tolerating statin, encouraged heart healthy diet, avoid trans fats, minimize simple carbs and saturated fats. Increase exercise as tolerated 

## 2022-01-29 NOTE — Assessment & Plan Note (Signed)
Well controlled, no changes to meds. Encouraged heart healthy diet such as the DASH diet and exercise as tolerated.  °

## 2022-01-29 NOTE — Assessment & Plan Note (Addendum)
Encouraged DASH or MIND diet, decrease po intake and increase exercise as tolerated. Needs 7-8 hours of sleep nightly. Avoid trans fats, eat small, frequent meals every 4-5 hours with lean proteins, complex carbs and healthy fats. Minimize simple carbs, high fat foods and processed foods, 12# weight loss since last year.

## 2022-01-29 NOTE — Assessment & Plan Note (Signed)
hgba1c acceptable, minimize simple carbs. Increase exercise as tolerated.  

## 2022-02-01 LAB — DRUG MONITORING PANEL 376104, URINE
Amphetamines: NEGATIVE ng/mL (ref <500)
Barbiturates: NEGATIVE ng/mL (ref <300)
Benzodiazepines: NEGATIVE ng/mL (ref <100)
Cocaine Metabolite: NEGATIVE ng/mL (ref <150)
Desmethyltramadol: NEGATIVE ng/mL (ref <100)
Opiates: NEGATIVE ng/mL (ref <100)
Oxycodone: NEGATIVE ng/mL (ref <100)
Tramadol: NEGATIVE ng/mL (ref <100)

## 2022-02-01 LAB — DM TEMPLATE

## 2022-06-11 ENCOUNTER — Other Ambulatory Visit: Payer: Self-pay | Admitting: Family Medicine

## 2022-06-14 ENCOUNTER — Other Ambulatory Visit: Payer: Self-pay | Admitting: Family Medicine

## 2022-06-14 DIAGNOSIS — M545 Low back pain, unspecified: Secondary | ICD-10-CM

## 2022-06-14 NOTE — Telephone Encounter (Signed)
Requesting: tramadol 50mg   Contract:01/02/21 UDS: 01/29/22 Last Visit: 01/29/22 Next Visit: 08/16/22 Last Refill: 01/29/22 #120 and 0RF   Please Advise

## 2022-06-15 MED ORDER — TRAMADOL HCL 50 MG PO TABS: ORAL_TABLET | ORAL | 0 refills | Status: DC

## 2022-08-10 DIAGNOSIS — I10 Essential (primary) hypertension: Secondary | ICD-10-CM

## 2022-08-13 MED ORDER — AMLODIPINE BESYLATE 10 MG PO TABS: 10.0000 mg | ORAL_TABLET | Freq: Every day | ORAL | 1 refills | Status: DC

## 2022-08-13 MED ORDER — ROSUVASTATIN CALCIUM 20 MG PO TABS: 20.0000 mg | ORAL_TABLET | Freq: Every day | ORAL | 1 refills | Status: DC

## 2022-08-16 ENCOUNTER — Encounter: Payer: BC Managed Care – PPO | Admitting: Family Medicine

## 2022-12-29 ENCOUNTER — Other Ambulatory Visit: Payer: Self-pay | Admitting: Family Medicine

## 2022-12-29 DIAGNOSIS — M545 Low back pain, unspecified: Secondary | ICD-10-CM

## 2022-12-31 MED ORDER — TRAMADOL HCL 50 MG PO TABS: ORAL_TABLET | ORAL | 0 refills | Status: DC

## 2022-12-31 NOTE — Telephone Encounter (Signed)
Requesting: traMADol (ULTRAM) 50 MG tablet : TAKE 1-2 TABLETS BY MOUTH EVERY 12 HOURS AS NEEDED FOR MODERATE OR SEVERE PAIN  Contract: 01/02/2021 UDS: 01/29/2022 Last Visit: 01/29/2022 Next Visit: 01/14/2023 Last Refill: 06/15/2022 #120 no refills  Please Advise

## 2023-01-13 NOTE — Assessment & Plan Note (Signed)
hgba1c acceptable, minimize simple carbs. Increase exercise as tolerated.  

## 2023-01-13 NOTE — Assessment & Plan Note (Signed)
Encouraged DASH or MIND diet, decrease po intake and increase exercise as tolerated. Needs 7-8 hours of sleep nightly. Avoid trans fats, eat small, frequent meals every 4-5 hours with lean proteins, complex carbs and healthy fats. Minimize simple carbs, high fat foods and processed foods 

## 2023-01-13 NOTE — Assessment & Plan Note (Signed)
Patient encouraged to maintain heart healthy diet, regular exercise, adequate sleep. Consider daily probiotics. Take medications as prescribed. Labs ordered and reviewed.  Given and reviewed copy of ACP documents from [employer] and encouraged to complete and return  colonoscopy

## 2023-01-13 NOTE — Progress Notes (Unsigned)
Subjective:    Patient ID: Jason Harper, male    DOB: 30-Apr-1971, 52 y.o.   MRN: [id number]  No chief complaint on file.   HPI Discussed the use of AI scribe software for clinical note transcription with the patient, who gave verbal consent to proceed.  History of Present Illness   Patient is a 52 yo male in today for annual preventative exam and follow up on chronic medical concerns. No recent febrile illness or hospitalizations. Denies CP/palp/SOB/HA/congestion/fevers/GI or GU c/o. Taking meds as prescribed           Past Medical History:  Diagnosis Date   Gout 04/29/2017   Headache 11/12/2016   Hearing loss in right ear 03/22/2015   Secondary to Scarlet Fever as child   High cholesterol    Hypertension    Obesity 03/22/2015   Preventative health care 11/12/2016    No past surgical history on file.  Family History  Problem Relation Age of Onset   Heart disease Mother    Hyperlipidemia Mother    Hypertension Mother    Arthritis Mother    Hypertension Father    Hyperlipidemia Father    Heart disease Father        MI   Hypertension Brother    Heart disease Brother        MI    Social History   Socioeconomic History   Marital status: Legally Separated    Spouse name: Not on file   Number of children: Not on file   Years of education: Not on file   Highest education level: Not on file  Occupational History   Not on file  Tobacco Use   Smoking status: Never   Smokeless tobacco: Never  Vaping Use   Vaping Use: Never used  Substance and Sexual Activity   Alcohol use: Yes    Alcohol/week: 1.0 standard drink of alcohol    Types: 1 Cans of beer per week   Drug use: No   Sexual activity: Not on file    Comment: lives alon, no dietary restrictions, works Quarry manager job  Other Topics Concern   Not on file  Social History Narrative   Not on file   Social Determinants of Health   Financial Resource Strain: Not on file  Food Insecurity: Not on  file  Transportation Needs: Not on file  Physical Activity: Not on file  Stress: Not on file  Social Connections: Not on file  Intimate Partner Violence: Not on file    Outpatient Medications Prior to Visit  Medication Sig Dispense Refill   amLODipine (NORVASC) 10 MG tablet Take 1 tablet (10 mg total) by mouth daily. 90 tablet 1   Ascorbic Acid (VITAMIN C PO) Take 1 tablet by mouth daily.     aspirin EC 81 MG tablet Take 81 mg by mouth daily.     diclofenac (VOLTAREN) 75 MG EC tablet Take 1 tablet (75 mg total) by mouth 2 (two) times daily as needed. 90 tablet 1   EPINEPHrine 0.3 mg/0.3 mL IJ SOAJ injection INJECT CONTENTS OF 1 PEN AS NEEDED FOR ALLERGIC REACTION 1 each 1   potassium chloride (KLOR-CON) 10 MEQ tablet TAKE 1 TABLET BY MOUTH ONCE DAILY 3 tablet 0   rosuvastatin (CRESTOR) 20 MG tablet Take 1 tablet (20 mg total) by mouth daily. 90 tablet 1   tiZANidine (ZANAFLEX) 2 MG tablet Take 0.5-2 tablets (1-4 mg total) by mouth 2 (two) times daily as needed for muscle spasms.  40 tablet 0   traMADol (ULTRAM) 50 MG tablet TAKE 1-2 TABLETS BY MOUTH EVERY 12 HOURS AS NEEDED FOR MODERATE OR SEVERE PAIN 120 tablet 0   Zinc 50 MG TABS Take 1 tablet by mouth daily.     No facility-administered medications prior to visit.    Allergies  Allergen Reactions   Penicillins Swelling    Review of Systems  Constitutional:  Negative for chills, fever and malaise/fatigue.  HENT:  Negative for congestion and hearing loss.   Eyes:  Negative for discharge.  Respiratory:  Negative for cough, sputum production and shortness of breath.   Cardiovascular:  Negative for chest pain, palpitations and leg swelling.  Gastrointestinal:  Negative for abdominal pain, blood in stool, constipation, diarrhea, heartburn, nausea and vomiting.  Genitourinary:  Negative for dysuria, frequency, hematuria and urgency.  Musculoskeletal:  Negative for back pain, falls and myalgias.  Skin:  Negative for rash.   Neurological:  Negative for dizziness, sensory change, loss of consciousness, weakness and headaches.  Endo/Heme/Allergies:  Negative for environmental allergies. Does not bruise/bleed easily.  Psychiatric/Behavioral:  Negative for depression and suicidal ideas. The patient is not nervous/anxious and does not have insomnia.        Objective:    Physical Exam Vitals reviewed.  Constitutional:      General: He is not in acute distress.    Appearance: Normal appearance. He is not ill-appearing or diaphoretic.  HENT:     Head: Normocephalic and atraumatic.     Right Ear: Tympanic membrane, ear canal and external ear normal. There is no impacted cerumen.     Left Ear: Tympanic membrane, ear canal and external ear normal. There is no impacted cerumen.     Nose: Nose normal. No rhinorrhea.     Mouth/Throat:     Pharynx: Oropharynx is clear.  Eyes:     General: No scleral icterus.    Extraocular Movements: Extraocular movements intact.     Conjunctiva/sclera: Conjunctivae normal.     Pupils: Pupils are equal, round, and reactive to light.  Neck:     Thyroid: No thyroid mass or thyroid tenderness.  Cardiovascular:     Rate and Rhythm: Normal rate and regular rhythm.     Pulses: Normal pulses.     Heart sounds: Normal heart sounds. No murmur heard. Pulmonary:     Effort: Pulmonary effort is normal.     Breath sounds: Normal breath sounds. No wheezing.  Abdominal:     General: Bowel sounds are normal.     Palpations: Abdomen is soft. There is no mass.     Tenderness: There is no guarding.  Musculoskeletal:        General: No swelling. Normal range of motion.     Cervical back: Normal range of motion and neck supple. No rigidity.     Right lower leg: No edema.     Left lower leg: No edema.  Lymphadenopathy:     Cervical: No cervical adenopathy.  Skin:    General: Skin is warm and dry.     Findings: No rash.  Neurological:     General: No focal deficit present.     Mental  Status: He is alert and oriented to person, place, and time.     Cranial Nerves: No cranial nerve deficit.     Deep Tendon Reflexes: Reflexes normal.  Psychiatric:        Mood and Affect: Mood normal.        Behavior: Behavior normal.  There were no vitals taken for this visit. Wt Readings from Last 3 Encounters:  01/29/22 215 lb 6.4 oz (97.7 kg)  08/14/21 225 lb 12.8 oz (102.4 kg)  01/02/21 227 lb 3.2 oz (103.1 kg)    Diabetic Foot Exam - Simple   No data filed    Lab Results  Component Value Date   WBC 8.1 01/29/2022   HGB 14.4 01/29/2022   HCT 42.7 01/29/2022   PLT 301.0 01/29/2022   GLUCOSE 94 01/29/2022   CHOL 137 01/29/2022   TRIG 142.0 01/29/2022   HDL 28.80 (L) 01/29/2022   LDLDIRECT 104.0 08/14/2021   LDLCALC 80 01/29/2022   ALT 24 01/29/2022   AST 23 01/29/2022   NA 140 01/29/2022   K 4.0 01/29/2022   CL 101 01/29/2022   CREATININE 0.91 01/29/2022   BUN 11 01/29/2022   CO2 29 01/29/2022   TSH 3.20 01/29/2022   PSA 0.9 07/26/2014   HGBA1C 5.7 01/29/2022    Lab Results  Component Value Date   TSH 3.20 01/29/2022   Lab Results  Component Value Date   WBC 8.1 01/29/2022   HGB 14.4 01/29/2022   HCT 42.7 01/29/2022   MCV 84.2 01/29/2022   PLT 301.0 01/29/2022   Lab Results  Component Value Date   NA 140 01/29/2022   K 4.0 01/29/2022   CO2 29 01/29/2022   GLUCOSE 94 01/29/2022   BUN 11 01/29/2022   CREATININE 0.91 01/29/2022   BILITOT 0.6 01/29/2022   ALKPHOS 70 01/29/2022   AST 23 01/29/2022   ALT 24 01/29/2022   PROT 7.6 01/29/2022   ALBUMIN 4.8 01/29/2022   CALCIUM 9.3 01/29/2022   GFR 99.30 01/29/2022   Lab Results  Component Value Date   CHOL 137 01/29/2022   Lab Results  Component Value Date   HDL 28.80 (L) 01/29/2022   Lab Results  Component Value Date   LDLCALC 80 01/29/2022   Lab Results  Component Value Date   TRIG 142.0 01/29/2022   Lab Results  Component Value Date   CHOLHDL 5 01/29/2022   Lab Results   Component Value Date   HGBA1C 5.7 01/29/2022       Assessment & Plan:  Primary hypertension Assessment & Plan: Well controlled, no changes to meds. Encouraged heart healthy diet such as the DASH diet and exercise as tolerated.    Hyperlipidemia with target LDL less than 100 Assessment & Plan: Tolerating statin, encouraged heart healthy diet, avoid trans fats, minimize simple carbs and saturated fats. Increase exercise as tolerated   Obesity due to excess calories with serious comorbidity, unspecified classification Assessment & Plan: Encouraged DASH or MIND diet, decrease po intake and increase exercise as tolerated. Needs 7-8 hours of sleep nightly. Avoid trans fats, eat small, frequent meals every 4-5 hours with lean proteins, complex carbs and healthy fats. Minimize simple carbs, high fat foods and processed foods    Hyperglycemia Assessment & Plan: hgba1c acceptable, minimize simple carbs. Increase exercise as tolerated.    Preventative health care Assessment & Plan: Patient encouraged to maintain heart healthy diet, regular exercise, adequate sleep. Consider daily probiotics. Take medications as prescribed. Labs ordered and reviewed.  Given and reviewed copy of ACP documents from [facility] Secretary of State and encouraged to complete and return  colonoscopy     Assessment and Plan              Danise Edge, MD

## 2023-01-13 NOTE — Assessment & Plan Note (Signed)
Tolerating statin, encouraged heart healthy diet, avoid trans fats, minimize simple carbs and saturated fats. Increase exercise as tolerated 

## 2023-01-13 NOTE — Assessment & Plan Note (Signed)
Well controlled, no changes to meds. Encouraged heart healthy diet such as the DASH diet and exercise as tolerated.  °

## 2023-01-14 ENCOUNTER — Ambulatory Visit (INDEPENDENT_AMBULATORY_CARE_PROVIDER_SITE_OTHER): Payer: BC Managed Care – PPO | Admitting: Family Medicine

## 2023-01-14 ENCOUNTER — Encounter: Payer: Self-pay | Admitting: Family Medicine

## 2023-01-14 VITALS — BP 118/76 | HR 62 | Temp 98.0°F | Resp 12 | Ht 65.0 in | Wt 207.0 lb

## 2023-01-14 DIAGNOSIS — M545 Low back pain, unspecified: Secondary | ICD-10-CM

## 2023-01-14 DIAGNOSIS — Z79899 Other long term (current) drug therapy: Secondary | ICD-10-CM

## 2023-01-14 DIAGNOSIS — Z1211 Encounter for screening for malignant neoplasm of colon: Secondary | ICD-10-CM

## 2023-01-14 DIAGNOSIS — Z0000 Encounter for general adult medical examination without abnormal findings: Secondary | ICD-10-CM

## 2023-01-14 DIAGNOSIS — E785 Hyperlipidemia, unspecified: Secondary | ICD-10-CM

## 2023-01-14 DIAGNOSIS — E6609 Other obesity due to excess calories: Secondary | ICD-10-CM

## 2023-01-14 DIAGNOSIS — R351 Nocturia: Secondary | ICD-10-CM

## 2023-01-14 DIAGNOSIS — I10 Essential (primary) hypertension: Secondary | ICD-10-CM | POA: Diagnosis not present

## 2023-01-14 DIAGNOSIS — R739 Hyperglycemia, unspecified: Secondary | ICD-10-CM | POA: Diagnosis not present

## 2023-01-14 LAB — CBC WITH DIFFERENTIAL/PLATELET
Basophils Absolute: 0.1 10*3/uL (ref 0.0–0.1)
Basophils Relative: 1.3 % (ref 0.0–3.0)
Eosinophils Absolute: 0.8 10*3/uL — ABNORMAL HIGH (ref 0.0–0.7)
Eosinophils Relative: 10.6 % — ABNORMAL HIGH (ref 0.0–5.0)
HCT: 44.4 % (ref 39.0–52.0)
Hemoglobin: 14.7 g/dL (ref 13.0–17.0)
Lymphocytes Relative: 28.4 % (ref 12.0–46.0)
Lymphs Abs: 2.2 10*3/uL (ref 0.7–4.0)
MCHC: 33.2 g/dL (ref 30.0–36.0)
MCV: 83.7 fl (ref 78.0–100.0)
Monocytes Absolute: 0.6 10*3/uL (ref 0.1–1.0)
Monocytes Relative: 7.6 % (ref 3.0–12.0)
Neutro Abs: 4.1 10*3/uL (ref 1.4–7.7)
Neutrophils Relative %: 52.1 % (ref 43.0–77.0)
Platelets: 354 10*3/uL (ref 150.0–400.0)
RBC: 5.3 Mil/uL (ref 4.22–5.81)
RDW: 13.8 % (ref 11.5–15.5)
WBC: 7.9 10*3/uL (ref 4.0–10.5)

## 2023-01-14 LAB — LIPID PANEL
Cholesterol: 131 mg/dL (ref 0–200)
HDL: 36.3 mg/dL — ABNORMAL LOW (ref >39.00)
LDL Cholesterol: 65 mg/dL (ref 0–99)
NonHDL: 94.82
Total CHOL/HDL Ratio: 4
Triglycerides: 149 mg/dL (ref 0.0–149.0)
VLDL: 29.8 mg/dL (ref 0.0–40.0)

## 2023-01-14 LAB — COMPREHENSIVE METABOLIC PANEL
ALT: 22 U/L (ref 0–53)
AST: 23 U/L (ref 0–37)
Albumin: 4.6 g/dL (ref 3.5–5.2)
Alkaline Phosphatase: 64 U/L (ref 39–117)
BUN: 9 mg/dL (ref 6–23)
CO2: 26 mEq/L (ref 19–32)
Calcium: 9.1 mg/dL (ref 8.4–10.5)
Chloride: 104 mEq/L (ref 96–112)
Creatinine, Ser: 0.88 mg/dL (ref 0.40–1.50)
GFR: 100.63 mL/min (ref >60.00)
Glucose, Bld: 93 mg/dL (ref 70–99)
Potassium: 3.2 mEq/L — ABNORMAL LOW (ref 3.5–5.1)
Sodium: 140 mEq/L (ref 135–145)
Total Bilirubin: 0.6 mg/dL (ref 0.2–1.2)
Total Protein: 7.6 g/dL (ref 6.0–8.3)

## 2023-01-14 LAB — TSH: TSH: 2.66 u[IU]/mL (ref 0.35–5.50)

## 2023-01-14 LAB — PSA: PSA: 1.04 ng/mL (ref 0.10–4.00)

## 2023-01-14 LAB — HEMOGLOBIN A1C: Hgb A1c MFr Bld: 5.6 % (ref 4.6–6.5)

## 2023-01-14 MED ORDER — BACITRACIN 500 UNIT/GM EX OINT: 1.0000 | TOPICAL_OINTMENT | Freq: Two times a day (BID) | CUTANEOUS | 0 refills | Status: DC

## 2023-01-14 NOTE — Patient Instructions (Signed)
Shingrix is the new shingles shot, 2 shots over 2-6 months, confirm coverage with insurance and document, then can return here for shots with nurse appt or at pharmacy   Preventive Care 69-50 Years Old, Male Preventive care refers to lifestyle choices and visits with your health care provider that can promote health and wellness. Preventive care visits are also called wellness exams. What can I expect for my preventive care visit? Counseling During your preventive care visit, your health care provider may ask about your: Medical history, including: Past medical problems. Family medical history. Current health, including: Emotional well-being. Home life and relationship well-being. Sexual activity. Lifestyle, including: Alcohol, nicotine or tobacco, and drug use. Access to firearms. Diet, exercise, and sleep habits. Safety issues such as seatbelt and bike helmet use. Sunscreen use. Work and work Astronomer. Physical exam Your health care provider will check your: Height and weight. These may be used to calculate your BMI (body mass index). BMI is a measurement that tells if you are at a healthy weight. Waist circumference. This measures the distance around your waistline. This measurement also tells if you are at a healthy weight and may help predict your risk of certain diseases, such as type 2 diabetes and high blood pressure. Heart rate and blood pressure. Body temperature. Skin for abnormal spots. What immunizations do I need?  Vaccines are usually given at various ages, according to a schedule. Your health care provider will recommend vaccines for you based on your age, medical history, and lifestyle or other factors, such as travel or where you work. What tests do I need? Screening Your health care provider may recommend screening tests for certain conditions. This may include: Lipid and cholesterol levels. Diabetes screening. This is done by checking your blood sugar  (glucose) after you have not eaten for a while (fasting). Hepatitis B test. Hepatitis C test. HIV (human immunodeficiency virus) test. STI (sexually transmitted infection) testing, if you are at risk. Lung cancer screening. Prostate cancer screening. Colorectal cancer screening. Talk with your health care provider about your test results, treatment options, and if necessary, the need for more tests. Follow these instructions at home: Eating and drinking  Eat a diet that includes fresh fruits and vegetables, whole grains, lean protein, and low-fat dairy products. Take vitamin and mineral supplements as recommended by your health care provider. Do not drink alcohol if your health care provider tells you not to drink. If you drink alcohol: Limit how much you have to 0-2 drinks a day. Know how much alcohol is in your drink. In the [place] one drink equals one 12 oz bottle of beer (355 mL), one 5 oz glass of wine (148 mL), or one 1 oz glass of hard liquor (44 mL). Lifestyle Brush your teeth every morning and night with fluoride toothpaste. Floss one time each day. Exercise for at least 30 minutes 5 or more days each week. Do not use any products that contain nicotine or tobacco. These products include cigarettes, chewing tobacco, and vaping devices, such as e-cigarettes. If you need help quitting, ask your health care provider. Do not use drugs. If you are sexually active, practice safe sex. Use a condom or other form of protection to prevent STIs. Take aspirin only as told by your health care provider. Make sure that you understand how much to take and what form to take. Work with your health care provider to find out whether it is safe and beneficial for you to take aspirin daily. Find healthy  ways to manage stress, such as: Meditation, yoga, or listening to music. Journaling. Talking to a trusted person. Spending time with friends and family. Minimize exposure to UV radiation to reduce  your risk of skin cancer. Safety Always wear your seat belt while driving or riding in a vehicle. Do not drive: If you have been drinking alcohol. Do not ride with someone who has been drinking. When you are tired or distracted. While texting. If you have been using any mind-altering substances or drugs. Wear a helmet and other protective equipment during sports activities. If you have firearms in your house, make sure you follow all gun safety procedures. What's next? Go to your health care provider once a year for an annual wellness visit. Ask your health care provider how often you should have your eyes and teeth checked. Stay up to date on all vaccines. This information is not intended to replace advice given to you by your health care provider. Make sure you discuss any questions you have with your health care provider. Document Revised: 12/28/2020 Document Reviewed: 12/28/2020 Elsevier Patient Education  2024 [employer].

## 2023-01-15 ENCOUNTER — Other Ambulatory Visit: Payer: Self-pay | Admitting: *Deleted

## 2023-01-15 ENCOUNTER — Other Ambulatory Visit (HOSPITAL_BASED_OUTPATIENT_CLINIC_OR_DEPARTMENT_OTHER): Payer: Self-pay

## 2023-01-15 DIAGNOSIS — E876 Hypokalemia: Secondary | ICD-10-CM

## 2023-01-15 LAB — DM TEMPLATE

## 2023-01-15 LAB — DRUG MONITORING PANEL 376104, URINE
Amphetamines: NEGATIVE ng/mL (ref <500)
Barbiturates: NEGATIVE ng/mL (ref <300)
Benzodiazepines: NEGATIVE ng/mL (ref <100)
Cocaine Metabolite: NEGATIVE ng/mL (ref <150)
Desmethyltramadol: NEGATIVE ng/mL (ref <100)
Opiates: NEGATIVE ng/mL (ref <100)
Oxycodone: NEGATIVE ng/mL (ref <100)
Tramadol: NEGATIVE ng/mL (ref <100)

## 2023-01-15 MED ORDER — POTASSIUM CHLORIDE CRYS ER 20 MEQ PO TBCR
20.0000 meq | EXTENDED_RELEASE_TABLET | Freq: Every day | ORAL | 0 refills | Status: DC
  Filled 2023-01-15: qty 7, 7d supply, fill #0

## 2023-01-21 ENCOUNTER — Other Ambulatory Visit (INDEPENDENT_AMBULATORY_CARE_PROVIDER_SITE_OTHER): Payer: BC Managed Care – PPO

## 2023-01-21 DIAGNOSIS — E876 Hypokalemia: Secondary | ICD-10-CM | POA: Diagnosis not present

## 2023-01-21 LAB — COMPREHENSIVE METABOLIC PANEL
ALT: 34 U/L (ref 0–53)
AST: 30 U/L (ref 0–37)
Albumin: 4.2 g/dL (ref 3.5–5.2)
Alkaline Phosphatase: 54 U/L (ref 39–117)
BUN: 13 mg/dL (ref 6–23)
CO2: 30 mEq/L (ref 19–32)
Calcium: 9.3 mg/dL (ref 8.4–10.5)
Chloride: 101 mEq/L (ref 96–112)
Creatinine, Ser: 0.96 mg/dL (ref 0.40–1.50)
GFR: 92.49 mL/min (ref >60.00)
Glucose, Bld: 117 mg/dL — ABNORMAL HIGH (ref 70–99)
Potassium: 3.9 mEq/L (ref 3.5–5.1)
Sodium: 139 mEq/L (ref 135–145)
Total Bilirubin: 0.5 mg/dL (ref 0.2–1.2)
Total Protein: 7.1 g/dL (ref 6.0–8.3)

## 2023-02-11 ENCOUNTER — Other Ambulatory Visit: Payer: Self-pay | Admitting: Family Medicine

## 2023-02-11 DIAGNOSIS — I10 Essential (primary) hypertension: Secondary | ICD-10-CM

## 2023-07-09 ENCOUNTER — Other Ambulatory Visit: Payer: Self-pay | Admitting: Family Medicine

## 2023-07-11 ENCOUNTER — Other Ambulatory Visit: Payer: Self-pay | Admitting: Family Medicine

## 2023-07-11 DIAGNOSIS — I10 Essential (primary) hypertension: Secondary | ICD-10-CM

## 2023-07-11 DIAGNOSIS — M545 Low back pain, unspecified: Secondary | ICD-10-CM

## 2023-07-11 NOTE — Telephone Encounter (Signed)
/  Requesting: tramadol /Contract: 01/21/23 UDS:01/14/23 Last Visit:01/14/23 Next Visit:08/05/23 Last Refill:12/31/22  Please Advise

## 2023-07-11 NOTE — Telephone Encounter (Signed)
Copied from CRM [id number]. Topic: Clinical - Medication Refill >> Jul 11, 2023  1:40 PM Dimitri Ped wrote: Most Recent Primary Care Visit:  Provider: [facility]  Department: [facility]  Visit Type: LAB  Date: 01/21/2023  Medication: amLODipine (NORVASC) 10 MG tablet traMADol (ULTRAM) 50 MG tablet  Has the patient contacted their pharmacy?  (Agent: If no, request that the patient contact the pharmacy for the refill. If patient does not wish to contact the pharmacy document the reason why and proceed with request.) (Agent: If yes, when and what did the pharmacy advise?)  Is this the correct pharmacy for this prescription?  If no, delete pharmacy and type the correct one.  This is the patient's preferred pharmacy:  [facility] [place] [facility] [address] [place] [address] Phone: [phone] Fax: [phone]  [facility] [id number] [place], [place] - [address] [place] [address] Phone: [phone] Fax: [phone]  [id number] - [place], [place] - [address] [place] [address] Phone: [phone] Fax: [phone]   Has the prescription been filled recently?   Is the patient out of the medication?   Has the patient been seen for an appointment in the last year OR does the patient have an upcoming appointment?   Can we respond through MyChart?   Agent: Please be advised that Rx refills may take up to 3 business days. We ask that you follow-up with your pharmacy.

## 2023-07-12 MED ORDER — TRAMADOL HCL 50 MG PO TABS
ORAL_TABLET | ORAL | 0 refills | Status: DC
Start: 2023-07-12 — End: 2024-01-16

## 2023-07-12 MED ORDER — AMLODIPINE BESYLATE 10 MG PO TABS
10.0000 mg | ORAL_TABLET | Freq: Every day | ORAL | 1 refills | Status: DC
Start: 2023-07-12 — End: 2024-01-16

## 2023-07-12 MED ORDER — ROSUVASTATIN CALCIUM 20 MG PO TABS: 20.0000 mg | ORAL_TABLET | Freq: Every day | ORAL | 1 refills | Status: DC

## 2023-07-18 ENCOUNTER — Ambulatory Visit: Payer: BC Managed Care – PPO | Admitting: Family Medicine

## 2023-07-24 ENCOUNTER — Other Ambulatory Visit: Payer: Self-pay | Admitting: Family Medicine

## 2023-07-24 DIAGNOSIS — M545 Low back pain, unspecified: Secondary | ICD-10-CM

## 2023-08-04 NOTE — Assessment & Plan Note (Signed)
Tolerating statin, encouraged heart healthy diet, avoid trans fats, minimize simple carbs and saturated fats. Increase exercise as tolerated 

## 2023-08-04 NOTE — Assessment & Plan Note (Signed)
Encouraged DASH or MIND diet, decrease po intake and increase exercise as tolerated. Needs 7-8 hours of sleep nightly. Avoid trans fats, eat small, frequent meals every 4-5 hours with lean proteins, complex carbs and healthy fats. Minimize simple carbs, high fat foods and processed foods 

## 2023-08-04 NOTE — Assessment & Plan Note (Addendum)
Well controlled, no changes to meds. Encouraged heart healthy diet such as the DASH diet and exercise as tolerated.  Shingrix is the new shingles shot, 2 shots over 2-6 months, confirm coverage with insurance and document, then can return here for shots with nurse appt or at pharmacy  Annual COVID and flu boosters

## 2023-08-04 NOTE — Assessment & Plan Note (Signed)
hgba1c acceptable, minimize simple carbs. Increase exercise as tolerated.  

## 2023-08-05 ENCOUNTER — Ambulatory Visit: Payer: BC Managed Care – PPO | Admitting: Family Medicine

## 2023-08-05 VITALS — BP 132/84 | HR 74 | Temp 97.6°F | Resp 18 | Ht 65.0 in | Wt 210.8 lb

## 2023-08-05 DIAGNOSIS — E785 Hyperlipidemia, unspecified: Secondary | ICD-10-CM

## 2023-08-05 DIAGNOSIS — I10 Essential (primary) hypertension: Secondary | ICD-10-CM | POA: Diagnosis not present

## 2023-08-05 DIAGNOSIS — E6609 Other obesity due to excess calories: Secondary | ICD-10-CM | POA: Diagnosis not present

## 2023-08-05 DIAGNOSIS — Z1211 Encounter for screening for malignant neoplasm of colon: Secondary | ICD-10-CM

## 2023-08-05 DIAGNOSIS — R739 Hyperglycemia, unspecified: Secondary | ICD-10-CM | POA: Diagnosis not present

## 2023-08-05 MED ORDER — TIZANIDINE HCL 2 MG PO TABS: 1.0000 mg | ORAL_TABLET | Freq: Two times a day (BID) | ORAL | 1 refills | Status: DC | PRN

## 2023-08-05 MED ORDER — DOXYCYCLINE HYCLATE 100 MG PO TABS: 100.0000 mg | ORAL_TABLET | Freq: Two times a day (BID) | ORAL | 0 refills | Status: DC

## 2023-08-05 NOTE — Patient Instructions (Addendum)
Increase protein intake to at least 3 x a day. Eggs, nuts, seeds, meat (no nitrates), dairy, yogurt, cottage cheese, cheese sticks, protein ie yellow split pea powder, collageen powder, chia seeds, whey protein powder   Sinus Infection, Adult A sinus infection, also called sinusitis, is inflammation of your sinuses. Sinuses are hollow spaces in the bones around your face. Your sinuses are located: Around your eyes. In the middle of your forehead. Behind your nose. In your cheekbones. Mucus normally drains out of your sinuses. When your nasal tissues become inflamed or swollen, mucus can become trapped or blocked. This allows bacteria, viruses, and fungi to grow, which leads to infection. Most infections of the sinuses are caused by a virus. A sinus infection can develop quickly. It can last for up to 4 weeks (acute) or for more than 12 weeks (chronic). A sinus infection often develops after a cold. What are the causes? This condition is caused by anything that creates swelling in the sinuses or stops mucus from draining. This includes: Allergies. Asthma. Infection from bacteria or viruses. Deformities or blockages in your nose or sinuses. Abnormal growths in the nose (nasal polyps). Pollutants, such as chemicals or irritants in the air. Infection from fungi. This is rare. What increases the risk? You are more likely to develop this condition if you: Have a weak body defense system (immune system). Do a lot of swimming or diving. Overuse nasal sprays. Smoke. What are the signs or symptoms? The main symptoms of this condition are pain and a feeling of pressure around the affected sinuses. Other symptoms include: Stuffy nose or congestion that makes it difficult to breathe through your nose. Thick yellow or greenish drainage from your nose. Tenderness, swelling, and warmth over the affected sinuses. A cough that may get worse at night. Decreased sense of smell and taste. Extra mucus that  collects in the throat or the back of the nose (postnasal drip) causing a sore throat or bad breath. Tiredness (fatigue). Fever. How is this diagnosed? This condition is diagnosed based on: Your symptoms. Your medical history. A physical exam. Tests to find out if your condition is acute or chronic. This may include: Checking your nose for nasal polyps. Viewing your sinuses using a device that has a light (endoscope). Testing for allergies or bacteria. Imaging tests, such as an MRI or CT scan. In rare cases, a bone biopsy may be done to rule out more serious types of fungal sinus disease. How is this treated? Treatment for a sinus infection depends on the cause and whether your condition is chronic or acute. If caused by a virus, your symptoms should go away on their own within 10 days. You may be given medicines to relieve symptoms. They include: Medicines that shrink swollen nasal passages (decongestants). A spray that eases inflammation of the nostrils (topical intranasal corticosteroids). Rinses that help get rid of thick mucus in your nose (nasal saline washes). Medicines that treat allergies (antihistamines). Over-the-counter pain relievers. If caused by bacteria, your health care provider may recommend waiting to see if your symptoms improve. Most bacterial infections will get better without antibiotic medicine. You may be given antibiotics if you have: A severe infection. A weak immune system. If caused by narrow nasal passages or nasal polyps, surgery may be needed. Follow these instructions at home: Medicines Take, use, or apply over-the-counter and prescription medicines only as told by your health care provider. These may include nasal sprays. If you were prescribed an antibiotic medicine, take it  as told by your health care provider. Do not stop taking the antibiotic even if you start to feel better. Hydrate and humidify  Drink enough fluid to keep your urine pale yellow.  Staying hydrated will help to thin your mucus. Use a cool mist humidifier to keep the humidity level in your home above 50%. Inhale steam for 10-15 minutes, 3-4 times a day, or as told by your health care provider. You can do this in the bathroom while a hot shower is running. Limit your exposure to cool or dry air. Rest Rest as much as possible. Sleep with your head raised (elevated). Make sure you get enough sleep each night. General instructions  Apply a warm, moist washcloth to your face 3-4 times a day or as told by your health care provider. This will help with discomfort. Use nasal saline washes as often as told by your health care provider. Wash your hands often with soap and water to reduce your exposure to germs. If soap and water are not available, use hand sanitizer. Do not smoke. Avoid being around people who are smoking (secondhand smoke). Keep all follow-up visits. This is important. Contact a health care provider if: You have a fever. Your symptoms get worse. Your symptoms do not improve within 10 days. Get help right away if: You have a severe headache. You have persistent vomiting. You have severe pain or swelling around your face or eyes. You have vision problems. You develop confusion. Your neck is stiff. You have trouble breathing. These symptoms may be an emergency. Get help right away. Call 911. Do not wait to see if the symptoms will go away. Do not drive yourself to the hospital. Summary A sinus infection is soreness and inflammation of your sinuses. Sinuses are hollow spaces in the bones around your face. This condition is caused by nasal tissues that become inflamed or swollen. The swelling traps or blocks the flow of mucus. This allows bacteria, viruses, and fungi to grow, which leads to infection. If you were prescribed an antibiotic medicine, take it as told by your health care provider. Do not stop taking the antibiotic even if you start to feel  better. Keep all follow-up visits. This is important. This information is not intended to replace advice given to you by your health care provider. Make sure you discuss any questions you have with your health care provider. Document Revised: 06/06/2021 Document Reviewed: 06/06/2021 Elsevier Patient Education  2024 [employer].

## 2023-08-06 ENCOUNTER — Encounter: Payer: Self-pay | Admitting: Family Medicine

## 2023-08-06 NOTE — Progress Notes (Signed)
Subjective:    Patient ID: Jason Harper, male    DOB: 03/14/1971, 53 y.o.   MRN: [id number]  Chief Complaint  Patient presents with  . Follow-up    6 month    HPI Discussed the use of AI scribe software for clinical note transcription with the patient, who gave verbal consent to proceed.  History of Present Illness   The patient, a 53 year old, presents with a recent history of feeling unwell after donating blood through a process called "Power Red," where plasma and platelets are returned to the body while two pints of blood are taken. The patient experienced cold chills, headaches, and fatigue after the donation. The patient also reports a sinus issue that has been ongoing for about a week and a half, with no other associated symptoms such as runny nose, ear pain, or sore throat. The patient's partner has tested positive for type A flu, and the patient started feeling unwell before this diagnosis. The patient has been taking Mucinex for symptom relief and reports gradual improvement. The patient also mentions occasional body aches and has been prescribed tizanidine as a muscle relaxer, which they take as needed. The patient's diet mainly consists of one meal a day, with the patient expressing concern about their protein intake. The patient also reports adequate hydration.        Past Medical History:  Diagnosis Date  . Gout 04/29/2017  . Headache 11/12/2016  . Hearing loss in right ear 03/22/2015   Secondary to Scarlet Fever as child  . High cholesterol   . Hypertension   . Obesity 03/22/2015  . Preventative health care 11/12/2016    No past surgical history on file.  Family History  Problem Relation Age of Onset  . Heart disease Mother   . Hyperlipidemia Mother   . Hypertension Mother   . Arthritis Mother   . Hypertension Father   . Hyperlipidemia Father   . Heart disease Father        MI  . Hypertension Brother   . Heart disease Brother        MI    Social History    Socioeconomic History  . Marital status: Legally Separated    Spouse name: Not on file  . Number of children: Not on file  . Years of education: Not on file  . Highest education level: Not on file  Occupational History  . Not on file  Tobacco Use  . Smoking status: Never  . Smokeless tobacco: Never  Vaping Use  . Vaping status: Never Used  Substance and Sexual Activity  . Alcohol use: Yes    Alcohol/week: 1.0 standard drink of alcohol    Types: 1 Cans of beer per week  . Drug use: No  . 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 Not on file  Social History Narrative  . Not on file   Social Drivers of Health   Financial Resource Strain: Not on file  Food Insecurity: Not on file  Transportation Needs: Not on file  Physical Activity: Not on file  Stress: Not on file  Social Connections: Not on file  Intimate Partner Violence: Not on file    Outpatient Medications Prior to Visit  Medication Sig Dispense Refill  . amLODipine (NORVASC) 10 MG tablet Take 1 tablet (10 mg total) by mouth daily. 90 tablet 1  . Ascorbic Acid (VITAMIN C PO)  Take 1 tablet by mouth daily.    Marland Kitchen aspirin EC 81 MG tablet Take 81 mg by mouth daily.    . bacitracin 500 UNIT/GM ointment Apply 1 Application topically 2 (two) times daily. 15 g 0  . diclofenac (VOLTAREN) 75 MG EC tablet Take 1 tablet (75 mg total) by mouth 2 (two) times daily as needed. 90 tablet 1  . EPINEPHrine 0.3 mg/0.3 mL IJ SOAJ injection INJECT CONTENTS OF 1 PEN AS NEEDED FOR ALLERGIC REACTION 1 each 1  . rosuvastatin (CRESTOR) 20 MG tablet Take 1 tablet (20 mg total) by mouth daily. 90 tablet 1  . traMADol (ULTRAM) 50 MG tablet TAKE 1-2 TABLETS BY MOUTH EVERY 12 HOURS AS NEEDED FOR MODERATE OR SEVERE PAIN 120 tablet 0  . Zinc 50 MG TABS Take 1 tablet by mouth daily.    Marland Kitchen tiZANidine (ZANAFLEX) 2 MG tablet Take 0.5-2 tablets (1-4 mg total)  by mouth 2 (two) times daily as needed for muscle spasms. 40 tablet 0  . potassium chloride SA (KLOR-CON M) 20 MEQ tablet Take 1 tablet (20 mEq total) by mouth daily for 7 days. 7 tablet 0   No facility-administered medications prior to visit.    Allergies  Allergen Reactions  . Penicillins Swelling    Review of Systems  Constitutional:  Negative for fever and malaise/fatigue.  HENT:  Positive for congestion and sinus pain.   Eyes:  Negative for blurred vision.  Respiratory:  Positive for cough and sputum production. Negative for shortness of breath and wheezing.   Cardiovascular:  Negative for chest pain, palpitations and leg swelling.  Gastrointestinal:  Negative for abdominal pain, blood in stool and nausea.  Genitourinary:  Negative for dysuria and frequency.  Musculoskeletal:  Negative for falls.  Skin:  Negative for rash.  Neurological:  Negative for dizziness, loss of consciousness and headaches.  Endo/Heme/Allergies:  Negative for environmental allergies.  Psychiatric/Behavioral:  Negative for depression. The patient is not nervous/anxious.        Objective:    Physical Exam Vitals reviewed.  Constitutional:      Appearance: Normal appearance. He is not ill-appearing.  HENT:     Head: Normocephalic and atraumatic.     Nose: Nose normal.  Eyes:     Conjunctiva/sclera: Conjunctivae normal.  Cardiovascular:     Rate and Rhythm: Normal rate.     Pulses: Normal pulses.     Heart sounds: Normal heart sounds. No murmur heard. Pulmonary:     Effort: Pulmonary effort is normal.     Breath sounds: Normal breath sounds. No wheezing.  Abdominal:     Palpations: Abdomen is soft. There is no mass.     Tenderness: There is no abdominal tenderness.  Musculoskeletal:     Cervical back: Normal range of motion.     Right lower leg: No edema.     Left lower leg: No edema.  Skin:    General: Skin is warm and dry.  Neurological:     General: No focal deficit present.      Mental Status: He is alert and oriented to person, place, and time.  Psychiatric:        Mood and Affect: Mood normal.    BP 132/84 (BP Location: Left Arm, Patient Position: Sitting, Cuff Size: Large)   Pulse 74   Temp 97.6 F (36.4 C) (Oral)   Resp 18   Ht 5\' 5"  (1.651 m)   Wt 210 lb 12.8 oz (95.6 kg)   SpO2  98%   BMI 35.08 kg/m  Wt Readings from Last 3 Encounters:  08/05/23 210 lb 12.8 oz (95.6 kg)  01/14/23 207 lb (93.9 kg)  01/29/22 215 lb 6.4 oz (97.7 kg)    Diabetic Foot Exam - Simple   No data filed    Lab Results  Component Value Date   WBC 7.9 01/14/2023   HGB 14.7 01/14/2023   HCT 44.4 01/14/2023   PLT 354.0 01/14/2023   GLUCOSE 117 (H) 01/21/2023   CHOL 131 01/14/2023   TRIG 149.0 01/14/2023   HDL 36.30 (L) 01/14/2023   LDLDIRECT 104.0 08/14/2021   LDLCALC 65 01/14/2023   ALT 34 01/21/2023   AST 30 01/21/2023   NA 139 01/21/2023   K 3.9 01/21/2023   CL 101 01/21/2023   CREATININE 0.96 01/21/2023   BUN 13 01/21/2023   CO2 30 01/21/2023   TSH 2.66 01/14/2023   PSA 1.04 01/14/2023   HGBA1C 5.6 01/14/2023    Lab Results  Component Value Date   TSH 2.66 01/14/2023   Lab Results  Component Value Date   WBC 7.9 01/14/2023   HGB 14.7 01/14/2023   HCT 44.4 01/14/2023   MCV 83.7 01/14/2023   PLT 354.0 01/14/2023   Lab Results  Component Value Date   NA 139 01/21/2023   K 3.9 01/21/2023   CO2 30 01/21/2023   GLUCOSE 117 (H) 01/21/2023   BUN 13 01/21/2023   CREATININE 0.96 01/21/2023   BILITOT 0.5 01/21/2023   ALKPHOS 54 01/21/2023   AST 30 01/21/2023   ALT 34 01/21/2023   PROT 7.1 01/21/2023   ALBUMIN 4.2 01/21/2023   CALCIUM 9.3 01/21/2023   GFR 92.49 01/21/2023   Lab Results  Component Value Date   CHOL 131 01/14/2023   Lab Results  Component Value Date   HDL 36.30 (L) 01/14/2023   Lab Results  Component Value Date   LDLCALC 65 01/14/2023   Lab Results  Component Value Date   TRIG 149.0 01/14/2023   Lab Results   Component Value Date   CHOLHDL 4 01/14/2023   Lab Results  Component Value Date   HGBA1C 5.6 01/14/2023       Assessment & Plan:  Hyperglycemia Assessment & Plan: hgba1c acceptable, minimize simple carbs. Increase exercise as tolerated.   Orders: -     Hemoglobin A1c  Primary hypertension Assessment & Plan: Well controlled, no changes to meds. Encouraged heart healthy diet such as the DASH diet and exercise as tolerated.  Shingrix is the new shingles shot, 2 shots over 2-6 months, confirm coverage with insurance and document, then can return here for shots with nurse appt or at pharmacy  Annual COVID and flu boosters  Orders: -     Comprehensive metabolic panel -     CBC with Differential/Platelet -     TSH  Obesity due to excess calories with serious comorbidity, unspecified class Assessment & Plan: Encouraged DASH or MIND diet, decrease po intake and increase exercise as tolerated. Needs 7-8 hours of sleep nightly. Avoid trans fats, eat small, frequent meals every 4-5 hours with lean proteins, complex carbs and healthy fats. Minimize simple carbs, high fat foods and processed foods    Hyperlipidemia with target LDL less than 100 Assessment & Plan: Tolerating statin, encouraged heart healthy diet, avoid trans fats, minimize simple carbs and saturated fats. Increase exercise as tolerated  Orders: -     Lipid panel  Screening for colon cancer -     Cologuard  Other orders -     Doxycycline Hyclate; Take 1 tablet (100 mg total) by mouth 2 (two) times daily.  Dispense: 20 tablet; Refill: 0 -     tiZANidine HCl; Take 0.5-2 tablets (1-4 mg total) by mouth 2 (two) times daily as needed for muscle spasms.  Dispense: 40 tablet; Refill: 1    Assessment and Plan    Upper Respiratory Infection Likely viral given the time course and symptoms. No severe cough, sleep disturbances, runny nose, ear pain, or sore throat. -Continue Mucinex twice daily. -Add vitamin C and zinc  supplementation. -If symptoms worsen or do not improve, start Doxycycline.  Dehydration Dry tongue noted on examination, indicating possible dehydration. -Increase water intake to 60-80 ounces daily.  Nutrition Patient reports eating one meal a day and experiencing body aches. -Increase protein intake to at least three times daily. -Consider incorporating eggs, nuts, seeds, meat (no nitrates), collagen powder, and whey protein powder into diet.  Colon Cancer Screening No family history of colon cancer. Patient is over 52 years old. -Complete Cologuard test.  General Health Maintenance -Continue Tizanidine as needed for muscle relaxation. -Physical therapy referral for lower back pain. -Return in six months for follow-up.         Danise Edge, MD

## 2023-08-07 ENCOUNTER — Telehealth: Payer: Self-pay | Admitting: *Deleted

## 2023-08-07 NOTE — Addendum Note (Signed)
Addended by: Marian Sorrow D on: 08/07/2023 09:55 AM   Modules accepted: Orders

## 2023-08-07 NOTE — Telephone Encounter (Signed)
The labs were ordered 08/04/22 and were not collected. The orders have been addended and changed to future for expected date of 08/09/2023

## 2023-08-07 NOTE — Telephone Encounter (Signed)
Pt has a lab appointment on 08/09/23 and no lab orders.  Please place orders.

## 2023-08-09 ENCOUNTER — Other Ambulatory Visit: Payer: BC Managed Care – PPO

## 2023-08-13 ENCOUNTER — Ambulatory Visit: Payer: BC Managed Care – PPO | Admitting: Family Medicine

## 2023-08-16 ENCOUNTER — Other Ambulatory Visit (INDEPENDENT_AMBULATORY_CARE_PROVIDER_SITE_OTHER): Payer: BC Managed Care – PPO

## 2023-08-16 DIAGNOSIS — R739 Hyperglycemia, unspecified: Secondary | ICD-10-CM | POA: Diagnosis not present

## 2023-08-16 DIAGNOSIS — I10 Essential (primary) hypertension: Secondary | ICD-10-CM

## 2023-08-16 DIAGNOSIS — E785 Hyperlipidemia, unspecified: Secondary | ICD-10-CM | POA: Diagnosis not present

## 2023-08-16 LAB — CBC WITH DIFFERENTIAL/PLATELET
Basophils Absolute: 0.1 10*3/uL (ref 0.0–0.1)
Basophils Relative: 0.9 % (ref 0.0–3.0)
Eosinophils Absolute: 0.7 10*3/uL (ref 0.0–0.7)
Eosinophils Relative: 9.4 % — ABNORMAL HIGH (ref 0.0–5.0)
HCT: 42 % (ref 39.0–52.0)
Hemoglobin: 13.9 g/dL (ref 13.0–17.0)
Lymphocytes Relative: 26.4 % (ref 12.0–46.0)
Lymphs Abs: 1.9 10*3/uL (ref 0.7–4.0)
MCHC: 33.1 g/dL (ref 30.0–36.0)
MCV: 85.5 fL (ref 78.0–100.0)
Monocytes Absolute: 0.5 10*3/uL (ref 0.1–1.0)
Monocytes Relative: 7.3 % (ref 3.0–12.0)
Neutro Abs: 4.1 10*3/uL (ref 1.4–7.7)
Neutrophils Relative %: 56 % (ref 43.0–77.0)
Platelets: 339 10*3/uL (ref 150.0–400.0)
RBC: 4.91 Mil/uL (ref 4.22–5.81)
RDW: 13.9 % (ref 11.5–15.5)
WBC: 7.3 10*3/uL (ref 4.0–10.5)

## 2023-08-16 LAB — LIPID PANEL
Cholesterol: 122 mg/dL (ref 0–200)
HDL: 36.7 mg/dL — ABNORMAL LOW (ref >39.00)
LDL Cholesterol: 65 mg/dL (ref 0–99)
NonHDL: 85.01
Total CHOL/HDL Ratio: 3
Triglycerides: 100 mg/dL (ref 0.0–149.0)
VLDL: 20 mg/dL (ref 0.0–40.0)

## 2023-08-16 LAB — COMPREHENSIVE METABOLIC PANEL
ALT: 29 U/L (ref 0–53)
AST: 34 U/L (ref 0–37)
Albumin: 4.6 g/dL (ref 3.5–5.2)
Alkaline Phosphatase: 72 U/L (ref 39–117)
BUN: 11 mg/dL (ref 6–23)
CO2: 29 meq/L (ref 19–32)
Calcium: 9.1 mg/dL (ref 8.4–10.5)
Chloride: 102 meq/L (ref 96–112)
Creatinine, Ser: 0.87 mg/dL (ref 0.40–1.50)
GFR: 100.57 mL/min (ref >60.00)
Glucose, Bld: 102 mg/dL — ABNORMAL HIGH (ref 70–99)
Potassium: 3.5 meq/L (ref 3.5–5.1)
Sodium: 139 meq/L (ref 135–145)
Total Bilirubin: 0.6 mg/dL (ref 0.2–1.2)
Total Protein: 7.6 g/dL (ref 6.0–8.3)

## 2023-08-16 LAB — HEMOGLOBIN A1C: Hgb A1c MFr Bld: 5.7 % (ref 4.6–6.5)

## 2023-08-16 LAB — TSH: TSH: 2.37 u[IU]/mL (ref 0.35–5.50)

## 2023-08-18 ENCOUNTER — Encounter: Payer: Self-pay | Admitting: Family Medicine

## 2024-01-12 NOTE — Assessment & Plan Note (Signed)
 Encouraged DASH or MIND diet, decrease po intake and increase exercise as tolerated. Needs 7-8 hours of sleep nightly. Avoid trans fats, eat small, frequent meals every 4-5 hours with lean proteins, complex carbs and healthy fats. Minimize simple carbs, high fat foods and processed foods

## 2024-01-12 NOTE — Assessment & Plan Note (Signed)
 Tolerating statin, encouraged heart healthy diet, avoid trans fats, minimize simple carbs and saturated fats. Increase exercise as tolerated

## 2024-01-12 NOTE — Assessment & Plan Note (Signed)
 hgba1c acceptable, minimize simple carbs. Increase exercise as tolerated.

## 2024-01-12 NOTE — Assessment & Plan Note (Signed)
Avoid offending foods, start probiotics. Do not eat large meals in late evening and consider raising head of bed.  

## 2024-01-12 NOTE — Assessment & Plan Note (Signed)
 Well controlled, no changes to meds. Encouraged heart healthy diet such as the DASH diet and exercise as tolerated.

## 2024-01-16 ENCOUNTER — Ambulatory Visit: Payer: BC Managed Care – PPO | Admitting: Family Medicine

## 2024-01-16 ENCOUNTER — Ambulatory Visit (HOSPITAL_BASED_OUTPATIENT_CLINIC_OR_DEPARTMENT_OTHER)
Admission: RE | Admit: 2024-01-16 | Discharge: 2024-01-16 | Disposition: A | Discharge status: Home / Self Care | Source: Ambulatory Visit | Attending: Family Medicine | Admitting: Family Medicine

## 2024-01-16 ENCOUNTER — Encounter: Payer: Self-pay | Admitting: Family Medicine

## 2024-01-16 VITALS — BP 118/86 | HR 73 | Resp 16 | Ht 65.0 in | Wt 215.2 lb

## 2024-01-16 DIAGNOSIS — M545 Low back pain, unspecified: Secondary | ICD-10-CM | POA: Diagnosis not present

## 2024-01-16 DIAGNOSIS — E785 Hyperlipidemia, unspecified: Secondary | ICD-10-CM | POA: Diagnosis not present

## 2024-01-16 DIAGNOSIS — E6609 Other obesity due to excess calories: Secondary | ICD-10-CM | POA: Diagnosis not present

## 2024-01-16 DIAGNOSIS — R739 Hyperglycemia, unspecified: Secondary | ICD-10-CM

## 2024-01-16 DIAGNOSIS — Z79899 Other long term (current) drug therapy: Secondary | ICD-10-CM | POA: Diagnosis not present

## 2024-01-16 DIAGNOSIS — S61401A Unspecified open wound of right hand, initial encounter: Secondary | ICD-10-CM

## 2024-01-16 DIAGNOSIS — S6991XA Unspecified injury of right wrist, hand and finger(s), initial encounter: Secondary | ICD-10-CM | POA: Diagnosis not present

## 2024-01-16 DIAGNOSIS — M25552 Pain in left hip: Secondary | ICD-10-CM | POA: Insufficient documentation

## 2024-01-16 DIAGNOSIS — K219 Gastro-esophageal reflux disease without esophagitis: Secondary | ICD-10-CM

## 2024-01-16 DIAGNOSIS — I10 Essential (primary) hypertension: Secondary | ICD-10-CM | POA: Diagnosis not present

## 2024-01-16 LAB — COMPREHENSIVE METABOLIC PANEL WITH GFR
ALT: 29 U/L (ref 0–53)
AST: 36 U/L (ref 0–37)
Albumin: 4.8 g/dL (ref 3.5–5.2)
Alkaline Phosphatase: 66 U/L (ref 39–117)
BUN: 15 mg/dL (ref 6–23)
CO2: 30 meq/L (ref 19–32)
Calcium: 9 mg/dL (ref 8.4–10.5)
Chloride: 100 meq/L (ref 96–112)
Creatinine, Ser: 0.96 mg/dL (ref 0.40–1.50)
GFR: 91.85 mL/min (ref >60.00)
Glucose, Bld: 95 mg/dL (ref 70–99)
Potassium: 3.3 meq/L — ABNORMAL LOW (ref 3.5–5.1)
Sodium: 138 meq/L (ref 135–145)
Total Bilirubin: 0.7 mg/dL (ref 0.2–1.2)
Total Protein: 8 g/dL (ref 6.0–8.3)

## 2024-01-16 LAB — CBC WITH DIFFERENTIAL/PLATELET
Basophils Absolute: 0.1 10*3/uL (ref 0.0–0.1)
Basophils Relative: 1.2 % (ref 0.0–3.0)
Eosinophils Absolute: 0.8 10*3/uL — ABNORMAL HIGH (ref 0.0–0.7)
Eosinophils Relative: 8.7 % — ABNORMAL HIGH (ref 0.0–5.0)
HCT: 41.1 % (ref 39.0–52.0)
Hemoglobin: 13.9 g/dL (ref 13.0–17.0)
Lymphocytes Relative: 24.8 % (ref 12.0–46.0)
Lymphs Abs: 2.2 10*3/uL (ref 0.7–4.0)
MCHC: 33.8 g/dL (ref 30.0–36.0)
MCV: 81.5 fl (ref 78.0–100.0)
Monocytes Absolute: 0.7 10*3/uL (ref 0.1–1.0)
Monocytes Relative: 7.9 % (ref 3.0–12.0)
Neutro Abs: 5.2 10*3/uL (ref 1.4–7.7)
Neutrophils Relative %: 57.4 % (ref 43.0–77.0)
Platelets: 301 10*3/uL (ref 150.0–400.0)
RBC: 5.05 Mil/uL (ref 4.22–5.81)
RDW: 14.2 % (ref 11.5–15.5)
WBC: 9 10*3/uL (ref 4.0–10.5)

## 2024-01-16 LAB — LIPID PANEL
Cholesterol: 150 mg/dL (ref 0–200)
HDL: 37.6 mg/dL — ABNORMAL LOW (ref >39.00)
LDL Cholesterol: 88 mg/dL (ref 0–99)
NonHDL: 112.07
Total CHOL/HDL Ratio: 4
Triglycerides: 122 mg/dL (ref 0.0–149.0)
VLDL: 24.4 mg/dL (ref 0.0–40.0)

## 2024-01-16 LAB — HEMOGLOBIN A1C: Hgb A1c MFr Bld: 6.1 % (ref 4.6–6.5)

## 2024-01-16 LAB — TSH: TSH: 3.66 u[IU]/mL (ref 0.35–5.50)

## 2024-01-16 MED ORDER — TRAMADOL HCL 50 MG PO TABS: 50.0000 mg | ORAL_TABLET | Freq: Two times a day (BID) | ORAL | 0 refills | Status: AC | PRN

## 2024-01-16 MED ORDER — BACITRACIN 500 UNIT/GM EX OINT: 1.0000 | TOPICAL_OINTMENT | Freq: Two times a day (BID) | CUTANEOUS | 0 refills | Status: AC

## 2024-01-16 MED ORDER — SULFAMETHOXAZOLE-TRIMETHOPRIM 800-160 MG PO TABS: 1.0000 | ORAL_TABLET | Freq: Two times a day (BID) | ORAL | 0 refills | Status: AC

## 2024-01-16 MED ORDER — ROSUVASTATIN CALCIUM 20 MG PO TABS: 20.0000 mg | ORAL_TABLET | Freq: Every day | ORAL | 1 refills | Status: AC

## 2024-01-16 MED ORDER — TIZANIDINE HCL 2 MG PO TABS: 1.0000 mg | ORAL_TABLET | Freq: Two times a day (BID) | ORAL | 1 refills | Status: AC | PRN

## 2024-01-16 MED ORDER — AMLODIPINE BESYLATE 10 MG PO TABS: 10.0000 mg | ORAL_TABLET | Freq: Every day | ORAL | 1 refills | Status: DC

## 2024-01-16 NOTE — Patient Instructions (Signed)
 Hypertension, Adult High blood pressure (hypertension) is when the force of blood pumping through the arteries is too strong. The arteries are the blood vessels that carry blood from the heart throughout the body. Hypertension forces the heart to work harder to pump blood and may cause arteries to become narrow or stiff. Untreated or uncontrolled hypertension can lead to a heart attack, heart failure, a stroke, kidney disease, and other problems. A blood pressure reading consists of a higher number over a lower number. Ideally, your blood pressure should be below 120/80. The first ("top") number is called the systolic pressure. It is a measure of the pressure in your arteries as your heart beats. The second ("bottom") number is called the diastolic pressure. It is a measure of the pressure in your arteries as the heart relaxes. What are the causes? The exact cause of this condition is not known. There are some conditions that result in high blood pressure. What increases the risk? Certain factors may make you more likely to develop high blood pressure. Some of these risk factors are under your control, including: Smoking. Not getting enough exercise or physical activity. Being overweight. Having too much fat, sugar, calories, or salt (sodium) in your diet. Drinking too much alcohol. Other risk factors include: Having a personal history of heart disease, diabetes, high cholesterol, or kidney disease. Stress. Having a family history of high blood pressure and high cholesterol. Having obstructive sleep apnea. Age. The risk increases with age. What are the signs or symptoms? High blood pressure may not cause symptoms. Very high blood pressure (hypertensive crisis) may cause: Headache. Fast or irregular heartbeats (palpitations). Shortness of breath. Nosebleed. Nausea and vomiting. Vision changes. Severe chest pain, dizziness, and seizures. How is this diagnosed? This condition is diagnosed by  measuring your blood pressure while you are seated, with your arm resting on a flat surface, your legs uncrossed, and your feet flat on the floor. The cuff of the blood pressure monitor will be placed directly against the skin of your upper arm at the level of your heart. Blood pressure should be measured at least twice using the same arm. Certain conditions can cause a difference in blood pressure between your right and left arms. If you have a high blood pressure reading during one visit or you have normal blood pressure with other risk factors, you may be asked to: Return on a different day to have your blood pressure checked again. Monitor your blood pressure at home for 1 week or longer. If you are diagnosed with hypertension, you may have other blood or imaging tests to help your health care provider understand your overall risk for other conditions. How is this treated? This condition is treated by making healthy lifestyle changes, such as eating healthy foods, exercising more, and reducing your alcohol intake. You may be referred for counseling on a healthy diet and physical activity. Your health care provider may prescribe medicine if lifestyle changes are not enough to get your blood pressure under control and if: Your systolic blood pressure is above 130. Your diastolic blood pressure is above 80. Your personal target blood pressure may vary depending on your medical conditions, your age, and other factors. Follow these instructions at home: Eating and drinking  Eat a diet that is high in fiber and potassium, and low in sodium, added sugar, and fat. An example of this eating plan is called the DASH diet. DASH stands for Dietary Approaches to Stop Hypertension. To eat this way: Eat  plenty of fresh fruits and vegetables. Try to fill one half of your plate at each meal with fruits and vegetables. Eat whole grains, such as whole-wheat pasta, brown rice, or whole-grain bread. Fill about one  fourth of your plate with whole grains. Eat or drink low-fat dairy products, such as skim milk or low-fat yogurt. Avoid fatty cuts of meat, processed or cured meats, and poultry with skin. Fill about one fourth of your plate with lean proteins, such as fish, chicken without skin, beans, eggs, or tofu. Avoid pre-made and processed foods. These tend to be higher in sodium, added sugar, and fat. Reduce your daily sodium intake. Many people with hypertension should eat less than 1,500 mg of sodium a day. Do not drink alcohol if: Your health care provider tells you not to drink. You are pregnant, may be pregnant, or are planning to become pregnant. If you drink alcohol: Limit how much you have to: 0-1 drink a day for women. 0-2 drinks a day for men. Know how much alcohol is in your drink. In the [place] one drink equals one 12 oz bottle of beer (355 mL), one 5 oz glass of wine (148 mL), or one 1 oz glass of hard liquor (44 mL). Lifestyle  Work with your health care provider to maintain a healthy body weight or to lose weight. Ask what an ideal weight is for you. Get at least 30 minutes of exercise that causes your heart to beat faster (aerobic exercise) most days of the week. Activities may include walking, swimming, or biking. Include exercise to strengthen your muscles (resistance exercise), such as Pilates or lifting weights, as part of your weekly exercise routine. Try to do these types of exercises for 30 minutes at least 3 days a week. Do not use any products that contain nicotine or tobacco. These products include cigarettes, chewing tobacco, and vaping devices, such as e-cigarettes. If you need help quitting, ask your health care provider. Monitor your blood pressure at home as told by your health care provider. Keep all follow-up visits. This is important. Medicines Take over-the-counter and prescription medicines only as told by your health care provider. Follow directions carefully. Blood  pressure medicines must be taken as prescribed. Do not skip doses of blood pressure medicine. Doing this puts you at risk for problems and can make the medicine less effective. Ask your health care provider about side effects or reactions to medicines that you should watch for. Contact a health care provider if you: Think you are having a reaction to a medicine you are taking. Have headaches that keep coming back (recurring). Feel dizzy. Have swelling in your ankles. Have trouble with your vision. Get help right away if you: Develop a severe headache or confusion. Have unusual weakness or numbness. Feel faint. Have severe pain in your chest or abdomen. Vomit repeatedly. Have trouble breathing. These symptoms may be an emergency. Get help right away. Call 911. Do not wait to see if the symptoms will go away. Do not drive yourself to the hospital. Summary Hypertension is when the force of blood pumping through your arteries is too strong. If this condition is not controlled, it may put you at risk for serious complications. Your personal target blood pressure may vary depending on your medical conditions, your age, and other factors. For most people, a normal blood pressure is less than 120/80. Hypertension is treated with lifestyle changes, medicines, or a combination of both. Lifestyle changes include losing weight, eating a healthy,  low-sodium diet, exercising more, and limiting alcohol. This information is not intended to replace advice given to you by your health care provider. Make sure you discuss any questions you have with your health care provider. Document Revised: 05/09/2021 Document Reviewed: 05/09/2021 Elsevier Patient Education  2024 [employer].

## 2024-01-16 NOTE — Progress Notes (Signed)
 Subjective:    Patient ID: Jason Harper, male    DOB: 08-22-1970, 53 y.o.   MRN: [id number]  Chief Complaint  Patient presents with   Medical Management of Chronic Issues    Patient presents today for a 6 month follow-up   Quality Metric Gaps    Colonoscopy, Hep B & zoster vaccine    HPI Discussed the use of AI scribe software for clinical note transcription with the patient, who gave verbal consent to proceed.  History of Present Illness Jason Harper is a 53 year old male with hypertension who presents for a follow-up on elevated blood pressure and musculoskeletal pain.  He has been experiencing elevated blood pressure readings, initially recorded at 158/100 mmHg and later at 150/90 mmHg. He attributes this to stress and lack of sleep. He monitors his blood pressure at home. No headaches or chest pain are present.  He experiences musculoskeletal pain, particularly in the shoulder when sleeping on his side. He uses tramadol  as needed, primarily at night, to help relax his muscles and alleviate low back and hip pain. He walks 24,000 steps a day at work, which he believes contributes to his discomfort. He has a history of hip surgery and suspects that his current hip pain may be related to overuse following the surgery. He has not slept in 24 hours due to helping out a work mate after a care accident.   He has a small encapsulated infection on the right palm of his hand, which appeared four days ago. He does not recall any specific injury but mentions using cut gloves at work.  He has been actively managing his weight, successfully reducing his waist size from 42 to 36 inches by avoiding processed meats and consuming fruits like bananas and watermelon. He is mindful of his diet, avoiding salt and opting for seedless watermelon.  He recalls a past incident of swelling and an allergic reaction to penicillin, for which he was advised to take Benadryl. He is cautious about antibiotic use due  to this allergy.    Past Medical History:  Diagnosis Date   Gout 04/29/2017   Headache 11/12/2016   Hearing loss in right ear 03/22/2015   Secondary to Scarlet Fever as child   High cholesterol    Hypertension    Obesity 03/22/2015   Preventative health care 11/12/2016    History reviewed. No pertinent surgical history.  Family History  Problem Relation Age of Onset   Heart disease Mother    Hyperlipidemia Mother    Hypertension Mother    Arthritis Mother    Hypertension Father    Hyperlipidemia Father    Heart disease Father        MI   Hypertension Brother    Heart disease Brother        MI    Social History   Socioeconomic History   Marital status: Legally Separated    Spouse name: Not on file   Number of children: Not on file   Years of education: Not on file   Highest education level: Not on file  Occupational History   Not on file  Tobacco Use   Smoking status: Never   Smokeless tobacco: Never  Vaping Use   Vaping status: Never Used  Substance and Sexual Activity   Alcohol use: Yes    Alcohol/week: 1.0 standard drink of alcohol    Types: 1 Cans of beer per week   Drug use: No   Sexual activity: Not on  file    Comment: lives alon, no dietary restrictions, works Quarry manager job  Other Topics Concern   Not on file  Social History Narrative   Not on file   Social Drivers of Corporate investment banker Strain: Not on file  Food Insecurity: Not on file  Transportation Needs: Not on file  Physical Activity: Not on file  Stress: Not on file  Social Connections: Not on file  Intimate Partner Violence: Not on file    Outpatient Medications Prior to Visit  Medication Sig Dispense Refill   amLODipine  (NORVASC ) 10 MG tablet Take 1 tablet (10 mg total) by mouth daily. 90 tablet 1   Ascorbic Acid (VITAMIN C PO) Take 1 tablet by mouth daily.     aspirin EC 81 MG tablet Take 81 mg by mouth daily.     diclofenac  (VOLTAREN ) 75 MG EC tablet Take 1  tablet (75 mg total) by mouth 2 (two) times daily as needed. 90 tablet 1   EPINEPHrine  0.3 mg/0.3 mL IJ SOAJ injection INJECT CONTENTS OF 1 PEN AS NEEDED FOR ALLERGIC REACTION 1 each 1   potassium chloride  SA (KLOR-CON  M) 20 MEQ tablet Take 1 tablet (20 mEq total) by mouth daily for 7 days. 7 tablet 0   rosuvastatin  (CRESTOR ) 20 MG tablet Take 1 tablet (20 mg total) by mouth daily. 90 tablet 1   tiZANidine  (ZANAFLEX ) 2 MG tablet Take 0.5-2 tablets (1-4 mg total) by mouth 2 (two) times daily as needed for muscle spasms. 40 tablet 1   traMADol  (ULTRAM ) 50 MG tablet TAKE 1-2 TABLETS BY MOUTH EVERY 12 HOURS AS NEEDED FOR MODERATE OR SEVERE PAIN 120 tablet 0   Zinc 50 MG TABS Take 1 tablet by mouth daily.     bacitracin  500 UNIT/GM ointment Apply 1 Application topically 2 (two) times daily. 15 g 0   doxycycline  (VIBRA -TABS) 100 MG tablet Take 1 tablet (100 mg total) by mouth 2 (two) times daily. 20 tablet 0   No facility-administered medications prior to visit.    Allergies  Allergen Reactions   Penicillins Swelling    Review of Systems  Constitutional:  Negative for fever and malaise/fatigue.  HENT:  Negative for congestion.   Eyes:  Negative for blurred vision.  Respiratory:  Negative for shortness of breath.   Cardiovascular:  Negative for chest pain, palpitations and leg swelling.  Gastrointestinal:  Negative for abdominal pain, blood in stool and nausea.  Genitourinary:  Negative for dysuria and frequency.  Musculoskeletal:  Positive for back pain. Negative for falls and joint pain.  Skin:  Negative for rash.  Neurological:  Negative for dizziness, loss of consciousness and headaches.  Endo/Heme/Allergies:  Negative for environmental allergies.  Psychiatric/Behavioral:  Negative for depression. The patient is not nervous/anxious.        Objective:    Physical Exam Vitals reviewed.  Constitutional:      Appearance: Normal appearance. He is not ill-appearing.  HENT:     Head:  Normocephalic and atraumatic.     Nose: Nose normal.  Eyes:     Conjunctiva/sclera: Conjunctivae normal.  Cardiovascular:     Rate and Rhythm: Normal rate.     Pulses: Normal pulses.     Heart sounds: Normal heart sounds. No murmur heard. Pulmonary:     Effort: Pulmonary effort is normal.     Breath sounds: Normal breath sounds. No wheezing.  Abdominal:     Palpations: Abdomen is soft. There is no mass.  Tenderness: There is no abdominal tenderness.  Musculoskeletal:     Cervical back: Normal range of motion.     Right lower leg: No edema.     Left lower leg: No edema.  Skin:    General: Skin is warm and dry.     Findings: Lesion present.     Comments: Palm of right hand, hard, raised lesion no surrounding fluctuance. Debrided with sterile 13 blade after cleaning with H2O2. No blood, pus or Foreign body noted.   Neurological:     General: No focal deficit present.     Mental Status: He is alert and oriented to person, place, and time.  Psychiatric:        Mood and Affect: Mood normal.     BP (!) 150/96   Pulse 73   Resp 16   Ht 5' 5 (1.651 m)   Wt 215 lb 3.2 oz (97.6 kg)   SpO2 96%   BMI 35.81 kg/m  Wt Readings from Last 3 Encounters:  01/16/24 215 lb 3.2 oz (97.6 kg)  08/05/23 210 lb 12.8 oz (95.6 kg)  01/14/23 207 lb (93.9 kg)    Diabetic Foot Exam - Simple   No data filed    Lab Results  Component Value Date   WBC 7.3 08/16/2023   HGB 13.9 08/16/2023   HCT 42.0 08/16/2023   PLT 339.0 08/16/2023   GLUCOSE 102 (H) 08/16/2023   CHOL 122 08/16/2023   TRIG 100.0 08/16/2023   HDL 36.70 (L) 08/16/2023   LDLDIRECT 104.0 08/14/2021   LDLCALC 65 08/16/2023   ALT 29 08/16/2023   AST 34 08/16/2023   NA 139 08/16/2023   K 3.5 08/16/2023   CL 102 08/16/2023   CREATININE 0.87 08/16/2023   BUN 11 08/16/2023   CO2 29 08/16/2023   TSH 2.37 08/16/2023   PSA 1.04 01/14/2023   HGBA1C 5.7 08/16/2023    Lab Results  Component Value Date   TSH 2.37 08/16/2023    Lab Results  Component Value Date   WBC 7.3 08/16/2023   HGB 13.9 08/16/2023   HCT 42.0 08/16/2023   MCV 85.5 08/16/2023   PLT 339.0 08/16/2023   Lab Results  Component Value Date   NA 139 08/16/2023   K 3.5 08/16/2023   CO2 29 08/16/2023   GLUCOSE 102 (H) 08/16/2023   BUN 11 08/16/2023   CREATININE 0.87 08/16/2023   BILITOT 0.6 08/16/2023   ALKPHOS 72 08/16/2023   AST 34 08/16/2023   ALT 29 08/16/2023   PROT 7.6 08/16/2023   ALBUMIN 4.6 08/16/2023   CALCIUM  9.1 08/16/2023   GFR 100.57 08/16/2023   Lab Results  Component Value Date   CHOL 122 08/16/2023   Lab Results  Component Value Date   HDL 36.70 (L) 08/16/2023   Lab Results  Component Value Date   LDLCALC 65 08/16/2023   Lab Results  Component Value Date   TRIG 100.0 08/16/2023   Lab Results  Component Value Date   CHOLHDL 3 08/16/2023   Lab Results  Component Value Date   HGBA1C 5.7 08/16/2023       Assessment & Plan:  Hyperglycemia Assessment & Plan: hgba1c acceptable, minimize simple carbs. Increase exercise as tolerated.    Hyperlipidemia with target LDL less than 100 Assessment & Plan: Tolerating statin, encouraged heart healthy diet, avoid trans fats, minimize simple carbs and saturated fats. Increase exercise as tolerated   Primary hypertension Assessment & Plan: Well controlled, no changes to meds. Encouraged  heart healthy diet such as the DASH diet and exercise as tolerated.      Obesity due to excess calories with serious comorbidity, unspecified class Assessment & Plan: Encouraged DASH or MIND diet, decrease po intake and increase exercise as tolerated. Needs 7-8 hours of sleep nightly. Avoid trans fats, eat small, frequent meals every 4-5 hours with lean proteins, complex carbs and healthy fats. Minimize simple carbs, high fat foods and processed foods    Gastroesophageal reflux disease, unspecified whether esophagitis present Assessment & Plan: Avoid offending foods,  start probiotics. Do not eat large meals in late evening and consider raising head of bed.       Assessment and Plan Assessment & Plan Right Hand Lesion Encapsulated infection on right palm, possible metal splinter entry from work, check an xray - Debride with hydrogen peroxide. - Consider antibiotics post-debridement if necessary.  Hip Pain Chronic hip pain likely due to previous surgery and osteoarthritis. - Order hip x-ray. - Refer to sports medicine for joint injection and physical therapy.  Hypertension Blood pressure elevated, improved with rest and medication adherence. Regular monitoring necessary. - Recheck blood pressure at home regularly. - Continue current antihypertensive regimen. - Monitor for symptoms such as headaches or chest pain.  Medication Management Annual review of tramadol  use for pain relief. Informed of controlled nature and adherence importance. - Provide paperwork for tramadol  use. - Refill tramadol , rosuvastatin , amlodipine , and tizanidine  prescriptions.  General Health Maintenance Managing weight and diet, discussed flu vaccination documentation. - Encourage continued healthy eating habits. - Obtain written proof of flu vaccination for medical records.     Harlene Horton, MD

## 2024-01-17 LAB — RHEUMATOID FACTOR: Rheumatoid fact SerPl-aCnc: <10 [IU]/mL (ref <14)

## 2024-01-18 ENCOUNTER — Ambulatory Visit: Payer: Self-pay | Admitting: Family Medicine

## 2024-01-18 DIAGNOSIS — E876 Hypokalemia: Secondary | ICD-10-CM

## 2024-01-19 LAB — DRUG MONITORING PANEL 376104, URINE
Amphetamines: NEGATIVE ng/mL (ref <500)
Barbiturates: NEGATIVE ng/mL (ref <300)
Benzodiazepines: NEGATIVE ng/mL (ref <100)
Cocaine Metabolite: NEGATIVE ng/mL (ref <150)
Desmethyltramadol: NEGATIVE ng/mL (ref <100)
Opiates: NEGATIVE ng/mL (ref <100)
Oxycodone: NEGATIVE ng/mL (ref <100)
Tramadol: NEGATIVE ng/mL (ref <100)

## 2024-01-19 LAB — DM TEMPLATE

## 2024-01-20 NOTE — Progress Notes (Signed)
Patient reviewed via MyChart.

## 2024-01-31 ENCOUNTER — Other Ambulatory Visit (INDEPENDENT_AMBULATORY_CARE_PROVIDER_SITE_OTHER)

## 2024-01-31 ENCOUNTER — Encounter: Payer: Self-pay | Admitting: Family Medicine

## 2024-01-31 ENCOUNTER — Ambulatory Visit: Admitting: Family Medicine

## 2024-01-31 VITALS — BP 140/80 | Ht 65.0 in | Wt 215.0 lb

## 2024-01-31 DIAGNOSIS — M25551 Pain in right hip: Secondary | ICD-10-CM | POA: Diagnosis not present

## 2024-01-31 DIAGNOSIS — M25552 Pain in left hip: Secondary | ICD-10-CM

## 2024-01-31 DIAGNOSIS — E876 Hypokalemia: Secondary | ICD-10-CM

## 2024-01-31 DIAGNOSIS — R269 Unspecified abnormalities of gait and mobility: Secondary | ICD-10-CM

## 2024-01-31 NOTE — Patient Instructions (Signed)
 I think your hip and buttocks issues are related to imbalance of the hip muscle girdle.  We are placing you in physical therapy.  They will call you directly and set up an appointment.  You can ask them at that time what your co-pay would be.  I am asking them to set you up for 1 or 2 visits in educate you about a home exercise program.  This way would save you having to go in and pay a lot of co-pays.  It does mean that you have to do the exercises by yourself at home every day.  I would like to see you back in 4 to 6 weeks.  Please call in the interim if you have any concerns or new symptoms.  Great to meet you!

## 2024-02-01 LAB — COMPREHENSIVE METABOLIC PANEL WITH GFR
AG Ratio: 1.3 (calc) (ref 1.0–2.5)
ALT: 27 U/L (ref 9–46)
AST: 36 U/L — ABNORMAL HIGH (ref 10–35)
Albumin: 4.4 g/dL (ref 3.6–5.1)
Alkaline phosphatase (APISO): 64 U/L (ref 35–144)
BUN: 11 mg/dL (ref 7–25)
CO2: 21 mmol/L (ref 20–32)
Calcium: 9.1 mg/dL (ref 8.6–10.3)
Chloride: 100 mmol/L (ref 98–110)
Creat: 0.88 mg/dL (ref 0.70–1.30)
Globulin: 3.4 g/dL (ref 1.9–3.7)
Glucose, Bld: 95 mg/dL (ref 65–99)
Potassium: 3.1 mmol/L — ABNORMAL LOW (ref 3.5–5.3)
Sodium: 135 mmol/L (ref 135–146)
Total Bilirubin: 0.6 mg/dL (ref 0.2–1.2)
Total Protein: 7.8 g/dL (ref 6.1–8.1)
eGFR: 104 mL/min/1.73m2 (ref >=60)

## 2024-02-01 NOTE — Progress Notes (Signed)
  Jason Harper - 53 y.o. 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 Walking better  than sitting although at times he feels a little unbalanced while walking    OBJECTIVE: BP (!) 140/80   Ht 5' 5 (1.651 m)   Wt 215 lb (97.5 kg)   BMI 35.78 kg/m   Physical Exam:  Vital signs are reviewed. GEN WD WN NAD HIPS Bilaterally he has FROM IR/ER. Mild pain on left side with internal rotation but this does not reproduce his pain, He is TTP in left buttock area over gluteus muscles. GAIT wide based, short stride length, rolling type gait Very tight hamstrings and cannot put fingers within 14 inches of floor on attempt to toe touch Distally NV intact  ASSESSMENT & PLAN:  Hip and buttock pain Gait abnormality Discussed options. This does not seem to be related to the hip joint per se, but more to the hip musculature. Will refer for formal PT, hopefully they can work towards HEP (financial reasons) and he will f/u in one month. See problem based charting & AVS for pt instructions.

## 2024-02-02 ENCOUNTER — Ambulatory Visit: Payer: Self-pay | Admitting: Family Medicine

## 2024-02-02 DIAGNOSIS — E876 Hypokalemia: Secondary | ICD-10-CM

## 2024-02-04 ENCOUNTER — Telehealth: Payer: Self-pay | Admitting: Family Medicine

## 2024-02-04 DIAGNOSIS — E876 Hypokalemia: Secondary | ICD-10-CM

## 2024-02-04 MED ORDER — POTASSIUM CHLORIDE CRYS ER 20 MEQ PO TBCR: EXTENDED_RELEASE_TABLET | ORAL | 0 refills | Status: DC

## 2024-02-04 NOTE — Telephone Encounter (Signed)
 Copied from CRM [id number]. Topic: General - Call Back - No Documentation >> Feb 04, 2024  3:57 PM Leah C wrote: Reason for CRM: Patient called in regards to scheduling for labs- scheduled patients for labs in a week. Patient is also asking if  Dr. Domenica is going to put in for his prescription to [facility] for the kcl potassium.   [phone] (H) Patient contact

## 2024-02-04 NOTE — Addendum Note (Signed)
 Addended by: Darice Vicario C on: 02/04/2024 05:01 PM   Modules accepted: Orders

## 2024-02-04 NOTE — Telephone Encounter (Signed)
 Patient was advised per Dr. Domenica to take  Potassium chloride  40 MEQ doe 3 days, then 20 MEQ daily afterwards.

## 2024-02-17 ENCOUNTER — Other Ambulatory Visit (INDEPENDENT_AMBULATORY_CARE_PROVIDER_SITE_OTHER)

## 2024-02-17 DIAGNOSIS — E876 Hypokalemia: Secondary | ICD-10-CM | POA: Diagnosis not present

## 2024-02-17 LAB — COMPREHENSIVE METABOLIC PANEL WITH GFR
ALT: 24 U/L (ref 0–53)
AST: 22 U/L (ref 0–37)
Albumin: 4.3 g/dL (ref 3.5–5.2)
Alkaline Phosphatase: 59 U/L (ref 39–117)
BUN: 12 mg/dL (ref 6–23)
CO2: 26 meq/L (ref 19–32)
Calcium: 8.9 mg/dL (ref 8.4–10.5)
Chloride: 101 meq/L (ref 96–112)
Creatinine, Ser: 0.84 mg/dL (ref 0.40–1.50)
GFR: 101.28 mL/min (ref >60.00)
Glucose, Bld: 176 mg/dL — ABNORMAL HIGH (ref 70–99)
Potassium: 3.4 meq/L — ABNORMAL LOW (ref 3.5–5.1)
Sodium: 137 meq/L (ref 135–145)
Total Bilirubin: 0.3 mg/dL (ref 0.2–1.2)
Total Protein: 7.8 g/dL (ref 6.0–8.3)

## 2024-02-18 ENCOUNTER — Ambulatory Visit: Payer: Self-pay | Admitting: Family

## 2024-02-18 DIAGNOSIS — I10 Essential (primary) hypertension: Secondary | ICD-10-CM

## 2024-03-04 ENCOUNTER — Other Ambulatory Visit: Payer: Self-pay | Admitting: Family Medicine

## 2024-03-04 DIAGNOSIS — E876 Hypokalemia: Secondary | ICD-10-CM

## 2024-03-04 MED ORDER — POTASSIUM CHLORIDE CRYS ER 20 MEQ PO TBCR: 20.0000 meq | EXTENDED_RELEASE_TABLET | Freq: Every day | ORAL | 1 refills | Status: AC

## 2024-03-04 NOTE — Telephone Encounter (Signed)
 Copied from CRM [id number]. Topic: Clinical - Medication Refill >> Mar 04, 2024  2:15 PM Suzen RAMAN wrote: Medication: potassium chloride  SA (KLOR-CON  M) 20 MEQ tablet  Has the patient contacted their pharmacy? Yes   This is the patient's preferred pharmacy:  [id number] [place], [place] - [address] [place] [address] Phone: [phone] Fax: [phone]  Is this the correct pharmacy for this prescription? Yes If no, delete pharmacy and type the correct one.   Has the prescription been filled recently? No  Is the patient out of the medication? No  Has the patient been seen for an appointment in the last year OR does the patient have an upcoming appointment? Yes  Can we respond through MyChart? Yes  Agent: Please be advised that Rx refills may take up to 3 business days. We ask that you follow-up with your pharmacy.

## 2024-03-05 ENCOUNTER — Telehealth: Payer: Self-pay

## 2024-03-05 ENCOUNTER — Other Ambulatory Visit: Payer: Self-pay

## 2024-03-05 DIAGNOSIS — E876 Hypokalemia: Secondary | ICD-10-CM

## 2024-03-05 NOTE — Telephone Encounter (Signed)
 Called pt x2 with no answer and voicemail full I have placed future orders for the patient to have his potasium levels re checked so all he needs to do is schedule lab apt   Copied from CRM #[id number]. Topic: Clinical - Request for Lab/Test Order >> Mar 04, 2024  2:14 PM Suzen RAMAN wrote: Reason for CRM: Patient would like lab placed to have his potassium level re-check. Please contact patient to schedule once orders are placed.  CB#[phone] (H) >> Mar 04, 2024  2:18 PM Suzen RAMAN wrote: Patient would prefer to be scheduled Friday 03/27/24 at the earlier appt since he works 3rd shift.

## 2024-03-06 ENCOUNTER — Ambulatory Visit: Admitting: Family Medicine

## 2024-03-18 ENCOUNTER — Telehealth: Payer: Self-pay | Admitting: Family Medicine

## 2024-03-18 MED ORDER — AMLODIPINE BESYLATE 10 MG PO TABS: 10.0000 mg | ORAL_TABLET | Freq: Every day | ORAL | 0 refills | Status: AC

## 2024-03-18 NOTE — Telephone Encounter (Signed)
 Copied from CRM #[id number]. Topic: Clinical - Medication Refill >> Mar 18, 2024  9:06 AM Viola F wrote: Medication: potassium chloride  SA (KLOR-CON  M) 20 MEQ tablet [[id number]]  Has the patient contacted their pharmacy? Yes (Agent: If no, request that the patient contact the pharmacy for the refill. If patient does not wish to contact the pharmacy document the reason why and proceed with request.) (Agent: If yes, when and what did the pharmacy advise?)  This is the patient's preferred pharmacy:   [facility] [id number] [place], [place] - [address] [place] [address] Phone: [phone] Fax: [phone]   Is this the correct pharmacy for this prescription? Yes If no, delete pharmacy and type the correct one.   Has the prescription been filled recently? Yes  Is the patient out of the medication? Yes  Has the patient been seen for an appointment in the last year OR does the patient have an upcoming appointment? Yes  Can we respond through MyChart? Yes  Agent: Please be advised that Rx refills may take up to 3 business days. We ask that you follow-up with your pharmacy.

## 2024-03-18 NOTE — Telephone Encounter (Signed)
 Returned patient's call and he was advised that medication was send into pharmacy in [facility].   Copied from CRM #[id number]. Topic: Clinical - Request for Lab/Test Order >> Mar 18, 2024  9:11 AM Viola FALCON wrote: Reason for CRM: Patient returned Jason Harper's call, scheduled lab appt for potassium recheck 04/06/24 and requested refill on potassium tablets

## 2024-04-06 ENCOUNTER — Other Ambulatory Visit (INDEPENDENT_AMBULATORY_CARE_PROVIDER_SITE_OTHER)

## 2024-04-06 ENCOUNTER — Ambulatory Visit: Payer: Self-pay | Admitting: Family Medicine

## 2024-04-06 DIAGNOSIS — E876 Hypokalemia: Secondary | ICD-10-CM | POA: Diagnosis not present

## 2024-04-06 LAB — COMPREHENSIVE METABOLIC PANEL WITH GFR
ALT: 25 U/L (ref 0–53)
AST: 22 U/L (ref 0–37)
Albumin: 4.3 g/dL (ref 3.5–5.2)
Alkaline Phosphatase: 64 U/L (ref 39–117)
BUN: 15 mg/dL (ref 6–23)
CO2: 28 meq/L (ref 19–32)
Calcium: 9.3 mg/dL (ref 8.4–10.5)
Chloride: 103 meq/L (ref 96–112)
Creatinine, Ser: 0.93 mg/dL (ref 0.40–1.50)
GFR: 95.27 mL/min (ref >60.00)
Glucose, Bld: 124 mg/dL — ABNORMAL HIGH (ref 70–99)
Potassium: 3.8 meq/L (ref 3.5–5.1)
Sodium: 139 meq/L (ref 135–145)
Total Bilirubin: 0.3 mg/dL (ref 0.2–1.2)
Total Protein: 7.3 g/dL (ref 6.0–8.3)

## 2024-04-07 NOTE — Progress Notes (Signed)
 Patient reviewed via MyChart.

## 2024-07-17 ENCOUNTER — Encounter: Payer: Self-pay | Admitting: Family Medicine

## 2024-08-31 ENCOUNTER — Encounter: Admitting: Family Medicine
